# Patient Record
Sex: Female | Born: 1940 | Race: White | Hispanic: No | Marital: Married | Smoking: Never smoker
Health system: Southern US, Community
[De-identification: ages and names within clinical notes are randomized; demographics above are authoritative.]

## PROBLEM LIST (undated history)

## (undated) DIAGNOSIS — R0600 Dyspnea, unspecified: Secondary | ICD-10-CM

## (undated) DIAGNOSIS — K759 Inflammatory liver disease, unspecified: Secondary | ICD-10-CM

## (undated) DIAGNOSIS — T7840XA Allergy, unspecified, initial encounter: Secondary | ICD-10-CM

## (undated) DIAGNOSIS — R251 Tremor, unspecified: Secondary | ICD-10-CM

## (undated) DIAGNOSIS — M1990 Unspecified osteoarthritis, unspecified site: Secondary | ICD-10-CM

## (undated) DIAGNOSIS — M810 Age-related osteoporosis without current pathological fracture: Secondary | ICD-10-CM

## (undated) DIAGNOSIS — H269 Unspecified cataract: Secondary | ICD-10-CM

## (undated) DIAGNOSIS — Z8739 Personal history of other diseases of the musculoskeletal system and connective tissue: Secondary | ICD-10-CM

## (undated) DIAGNOSIS — R600 Localized edema: Secondary | ICD-10-CM

## (undated) DIAGNOSIS — E119 Type 2 diabetes mellitus without complications: Secondary | ICD-10-CM

## (undated) DIAGNOSIS — E785 Hyperlipidemia, unspecified: Secondary | ICD-10-CM

## (undated) DIAGNOSIS — K219 Gastro-esophageal reflux disease without esophagitis: Secondary | ICD-10-CM

## (undated) DIAGNOSIS — I10 Essential (primary) hypertension: Secondary | ICD-10-CM

## (undated) DIAGNOSIS — Z8719 Personal history of other diseases of the digestive system: Secondary | ICD-10-CM

## (undated) DIAGNOSIS — R609 Edema, unspecified: Secondary | ICD-10-CM

## (undated) HISTORY — DX: Unspecified osteoarthritis, unspecified site: M19.90

## (undated) HISTORY — DX: Allergy, unspecified, initial encounter: T78.40XA

## (undated) HISTORY — DX: Gastro-esophageal reflux disease without esophagitis: K21.9

## (undated) HISTORY — PX: EYE SURGERY: SHX253

## (undated) HISTORY — DX: Unspecified cataract: H26.9

## (undated) HISTORY — DX: Hyperlipidemia, unspecified: E78.5

## (undated) HISTORY — PX: JOINT REPLACEMENT: SHX530

## (undated) HISTORY — PX: APPENDECTOMY: SHX54

## (undated) HISTORY — PX: CYST EXCISION: SHX5701

## (undated) HISTORY — PX: TUBAL LIGATION: SHX77

## (undated) HISTORY — PX: PARTIAL HIP ARTHROPLASTY: SHX733

## (undated) HISTORY — PX: TONSILLECTOMY: SHX5217

---

## 2015-04-12 ENCOUNTER — Encounter: Payer: Self-pay | Admitting: Physician Assistant

## 2015-04-12 ENCOUNTER — Ambulatory Visit (INDEPENDENT_AMBULATORY_CARE_PROVIDER_SITE_OTHER): Payer: Medicare Other | Admitting: Physician Assistant

## 2015-04-12 VITALS — BP 140/72 | HR 62 | Temp 98.2°F | Resp 16 | Wt 194.6 lb

## 2015-04-12 DIAGNOSIS — Z0000 Encounter for general adult medical examination without abnormal findings: Secondary | ICD-10-CM

## 2015-04-12 DIAGNOSIS — E7800 Pure hypercholesterolemia, unspecified: Secondary | ICD-10-CM | POA: Insufficient documentation

## 2015-04-12 DIAGNOSIS — Z7189 Other specified counseling: Secondary | ICD-10-CM

## 2015-04-12 DIAGNOSIS — I10 Essential (primary) hypertension: Secondary | ICD-10-CM | POA: Diagnosis not present

## 2015-04-12 DIAGNOSIS — M810 Age-related osteoporosis without current pathological fracture: Secondary | ICD-10-CM | POA: Diagnosis not present

## 2015-04-12 DIAGNOSIS — I152 Hypertension secondary to endocrine disorders: Secondary | ICD-10-CM | POA: Insufficient documentation

## 2015-04-12 DIAGNOSIS — M1990 Unspecified osteoarthritis, unspecified site: Secondary | ICD-10-CM

## 2015-04-12 DIAGNOSIS — E119 Type 2 diabetes mellitus without complications: Secondary | ICD-10-CM | POA: Diagnosis not present

## 2015-04-12 DIAGNOSIS — M19049 Primary osteoarthritis, unspecified hand: Secondary | ICD-10-CM | POA: Insufficient documentation

## 2015-04-12 DIAGNOSIS — E1140 Type 2 diabetes mellitus with diabetic neuropathy, unspecified: Secondary | ICD-10-CM | POA: Insufficient documentation

## 2015-04-12 DIAGNOSIS — E1159 Type 2 diabetes mellitus with other circulatory complications: Secondary | ICD-10-CM | POA: Insufficient documentation

## 2015-04-12 DIAGNOSIS — Z7689 Persons encountering health services in other specified circumstances: Secondary | ICD-10-CM

## 2015-04-12 DIAGNOSIS — E1169 Type 2 diabetes mellitus with other specified complication: Secondary | ICD-10-CM | POA: Insufficient documentation

## 2015-04-12 MED ORDER — GLIPIZIDE 5 MG PO TABS: 2.5000 mg | ORAL_TABLET | Freq: Every day | ORAL | Status: DC

## 2015-04-12 MED ORDER — PRAVASTATIN SODIUM 80 MG PO TABS: 80.0000 mg | ORAL_TABLET | Freq: Every day | ORAL | Status: DC

## 2015-04-12 MED ORDER — LOSARTAN POTASSIUM 50 MG PO TABS: 50.0000 mg | ORAL_TABLET | Freq: Every day | ORAL | Status: DC

## 2015-04-12 NOTE — Patient Instructions (Signed)

## 2015-04-12 NOTE — Progress Notes (Signed)
Patient: Kathleen Tran, Female    DOB: Mar 20, 1941, 74 y.o.   MRN: [id number] Visit Date: 04/12/2015  Today's Provider: Mar Daring, PA-C   Chief Complaint  Patient presents with  . Establish Care  . Medicare Wellness  . Medication Refill   Subjective:    Annual wellness visit Kathleen Tran is a 74 y.o. female. She feels well. She reports not exercising only when she goes shopping. She reports she is sleeping well. Per patient needs her wellness done. Establish care and refills on some medicine. Patient moved from [place] 6 weeks ago. She is currently living with her husband and her son. She states that they moved down here to be close to a really good friend of hers. She also has 5 other children that are spread out. She states she can't remember where they all are. She also states that right before she left [place] her primary care doctor did have her hemoglobin A1c, bone density, and other labs. She will sign a HIPAA release form for [place] to try to obtain these results.  -----------------------------------------------------------   Review of Systems  Constitutional: Negative.   HENT: Positive for rhinorrhea and sneezing.   Eyes: Positive for itching.  Respiratory: Negative.   Cardiovascular: Negative.   Gastrointestinal: Positive for abdominal pain.  Endocrine: Positive for cold intolerance.  Genitourinary: Negative.   Musculoskeletal: Positive for back pain, arthralgias and neck pain.  Skin: Negative.   Allergic/Immunologic: Positive for environmental allergies and food allergies.  Neurological: Positive for numbness.  Hematological: Negative.   Psychiatric/Behavioral: Negative.     Social History   Social History  . Marital Status: Married    Spouse Name: N/A  . Number of Children: N/A  . Years of Education: N/A   Occupational History  . Not on file.   Social History Main Topics  . Smoking status: Not on file  . Smokeless tobacco: Not on file    . Alcohol Use: Not on file  . Drug Use: Not on file  . Sexual Activity: Not on file   Other Topics Concern  . Not on file   Social History Narrative  . No narrative on file    Patient Active Problem List   Diagnosis Date Noted  . Arthritis 04/12/2015    No past surgical history on file.  Her family history is not on file.    Previous Medications   ASPIRIN 81 MG TABLET    Take 81 mg by mouth daily.   CALCIUM CARBONATE-VITAMIN D 600-125 MG-UNIT TABS    Take by mouth daily.   GLIPIZIDE (GLUCOTROL) 5 MG TABLET    Take 2.5 mg by mouth daily before supper.   LOSARTAN (COZAAR) 50 MG TABLET    Take 50 mg by mouth daily.   METFORMIN (GLUCOPHAGE) 1000 MG TABLET    Take 1,000 mg by mouth 2 (two) times daily with a meal.   PRAVASTATIN (PRAVACHOL) 80 MG TABLET    Take 80 mg by mouth daily.    Patient Care Team: Mar Daring, PA-C as PCP - General (Physician Assistant)     Objective:   Vitals: BP 140/72 mmHg  Pulse 62  Temp(Src) 98.2 F (36.8 C) (Oral)  Resp 16  Wt 194 lb 9.6 oz (88.27 kg)  Physical Exam  Constitutional: She is oriented to person, place, and time. She appears well-developed and well-nourished. No distress.  HENT:  Head: Normocephalic and atraumatic.  Right Ear: External ear normal.  Left  Ear: External ear normal.  Nose: Nose normal.  Mouth/Throat: Oropharynx is clear and moist. No oropharyngeal exudate.  Eyes: Conjunctivae and EOM are normal. Pupils are equal, round, and reactive to light. Right eye exhibits no discharge. Left eye exhibits no discharge. No scleral icterus.  Neck: Normal range of motion. Neck supple. No JVD present. No tracheal deviation present. No thyromegaly present.  Cardiovascular: Normal rate, regular rhythm, normal heart sounds and intact distal pulses.  Exam reveals no gallop and no friction rub.   No murmur heard. Pulmonary/Chest: Effort normal and breath sounds normal. No respiratory distress. She has no wheezes. She has no  rales. She exhibits no tenderness.  Patient refused breast exam. States she recently had a mammogram.  Abdominal: Soft. Bowel sounds are normal. She exhibits no distension and no mass. There is no tenderness. There is no rebound and no guarding.  Genitourinary:  Patient refused.  Musculoskeletal: Normal range of motion. She exhibits no edema or tenderness.  Lymphadenopathy:    She has no cervical adenopathy.  Neurological: She is alert and oriented to person, place, and time.  Skin: Skin is warm and dry. No rash noted. She is not diaphoretic.  Psychiatric: She has a normal mood and affect. Her behavior is normal. Judgment and thought content normal.  Vitals reviewed.   Activities of Daily Living No flowsheet data found.  Fall Risk Assessment No flowsheet data found.   Depression Screen No flowsheet data found.  Cognitive Testing - 6-CIT  Correct? Score   What year is it? yes 0 0 or 4  What month is it? yes 0 0 or 3  Memorize:    [name],  44,  [address]  [place],      What time is it? (within 1 hour) yes 0 0 or 3  Count backwards from 20 yes 0 0, 2, or 4  Name the months of the year yes 0 0, 2, or 4  Repeat name & address above no 3 0, 2, 4, 6, 8, or 10       TOTAL SCORE  3/28   Interpretation:  Normal  Normal (0-7) Abnormal (8-28)       Assessment & Plan:     Annual Wellness Visit  Reviewed patient's Family Medical History Reviewed and updated list of patient's medical providers Assessment of cognitive impairment was done Assessed patient's functional ability Established a written schedule for health screening [place] Completed and Reviewed  Exercise Activities and Dietary recommendations Goals    None       There is no immunization history on file for this patient.  Health Maintenance  Topic Date Due  . TETANUS/TDAP  09/14/1961  . MAMMOGRAM  09/14/1992  . COLONOSCOPY  09/14/1992  . ZOSTAVAX  09/15/2002  . DEXA SCAN   09/15/2007  . PNA vac Low Risk Adult (1 of 2 - PCV13) 09/15/2007  . INFLUENZA VACCINE  02/01/2015      Discussed health benefits of physical activity, and encouraged her to engage in regular exercise appropriate for her age and condition.   1. Establishing care with new doctor, encounter for HIPAA release form was filled out and signed by patient to try to obtain previous records from her physician in [place].  2. Medicare annual wellness visit, subsequent  3. Arthritis History of arthritis and status post left total hip replacement. States she is doing okay. Right hip does bother her from time to time. We'll reevaluate at follow-up.  4.  Type 2 diabetes mellitus without complication, without long-term current use of insulin (HCC) Currently only on glipizide for her type 2 diabetes. I will follow-up with her in 2-3 months to recheck her hemoglobin A1c and do diabetic foot exam. Glipizide refilled as below. She is to call the office if she has any questions or concerns prior to the follow-up. - glipiZIDE (GLUCOTROL) 5 MG tablet; Take 0.5 tablets (2.5 mg total) by mouth daily before supper.  Dispense: 90 tablet; Refill: 1  5. Osteoporosis Diagnosed with osteoporosis via bone density study. This was done in [place] prior to her leaving. She is currently on a calcium and vitamin D supplement daily. I will try to obtain the bone density study that was done in [place] to see these results.  6. Hypercholesterolemia States she has been stable on pravastatin 80 mg. This medication was refilled as below. I will recheck her lipid panel when she returns in 2-3 months. - pravastatin (PRAVACHOL) 80 MG tablet; Take 1 tablet (80 mg total) by mouth daily.  Dispense: 90 tablet; Refill: 2  7. Essential hypertension Stable on losartan 50 mg. I'll will refill her losartan as below. We will recheck her labs and blood pressure in 2-3 months when she returns for follow-up. - losartan (COZAAR) 50 MG tablet;  Take 1 tablet (50 mg total) by mouth daily.  Dispense: 90 tablet; Refill: 2  ------------------------------------------------------------------------------------------------------------

## 2015-04-19 ENCOUNTER — Other Ambulatory Visit: Payer: Self-pay | Admitting: Physician Assistant

## 2015-04-19 DIAGNOSIS — E119 Type 2 diabetes mellitus without complications: Secondary | ICD-10-CM

## 2015-04-19 MED ORDER — ONETOUCH LANCETS MISC: Status: DC

## 2015-04-19 MED ORDER — ONETOUCH VERIO IQ SYSTEM W/DEVICE KIT: PACK | Status: DC

## 2015-04-19 MED ORDER — METFORMIN HCL 1000 MG PO TABS
1000.0000 mg | ORAL_TABLET | Freq: Two times a day (BID) | ORAL | Status: DC
Start: 2015-04-19 — End: 2016-04-01

## 2015-04-19 MED ORDER — GLUCOSE BLOOD VI STRP: ORAL_STRIP | Status: DC

## 2015-07-13 ENCOUNTER — Ambulatory Visit: Payer: Medicare Other | Admitting: Physician Assistant

## 2015-07-16 ENCOUNTER — Encounter: Payer: Self-pay | Admitting: Physician Assistant

## 2015-07-16 ENCOUNTER — Ambulatory Visit (INDEPENDENT_AMBULATORY_CARE_PROVIDER_SITE_OTHER): Payer: Medicare Other | Admitting: Physician Assistant

## 2015-07-16 VITALS — BP 138/69 | HR 74 | Temp 98.7°F | Resp 16 | Wt 196.2 lb

## 2015-07-16 DIAGNOSIS — M1990 Unspecified osteoarthritis, unspecified site: Secondary | ICD-10-CM

## 2015-07-16 DIAGNOSIS — E7800 Pure hypercholesterolemia, unspecified: Secondary | ICD-10-CM

## 2015-07-16 DIAGNOSIS — E119 Type 2 diabetes mellitus without complications: Secondary | ICD-10-CM | POA: Diagnosis not present

## 2015-07-16 DIAGNOSIS — I10 Essential (primary) hypertension: Secondary | ICD-10-CM | POA: Diagnosis not present

## 2015-07-16 LAB — POCT GLYCOSYLATED HEMOGLOBIN (HGB A1C)
Est. average glucose Bld gHb Est-mCnc: 151
Hemoglobin A1C: 6.9

## 2015-07-16 LAB — POCT UA - MICROALBUMIN: Microalbumin Ur, POC: 20 mg/L

## 2015-07-16 NOTE — Progress Notes (Signed)
Patient: Kathleen Tran Female    DOB: 10-07-1940   75 y.o.   MRN: [id number] Visit Date: 07/16/2015  Today's Provider: Mar Daring, PA-C   Chief Complaint  Patient presents with  . Follow-up    Diabetes, Arthritis, cholesterol,Hypertension   Subjective:    HPI  Patient is here for her 2-3 month follow-up on: Hypertension-stable on Losartan last office blood pressure was 140/72. Following on her Arthritis right hip and left knee that have been gradually worsening.  Right hip is worst. Starting to have radiating symptoms down right anterior thigh. Diabetes Type 2, stable on Metformin and Glipizide. Cholesterol-stable on Pravastatin. Symptoms:blurred vision, chest pain sometimes. Patient denies excessive thirst, dizziness, leg swelling,headaches, fatigue,frequent urination, irregular heart beat, SOB,palpitations or hematuria.  She has had pneumococcal-23 and prevnar vaccinations as well as Zostavax per patient approx 3 years ago.  Will attempt again to get records from Dr. Tonie Griffith in [place].    No Known Allergies Previous Medications   ASPIRIN 81 MG TABLET    Take 81 mg by mouth daily.   BLOOD GLUCOSE MONITORING SUPPL (ONETOUCH VERIO IQ SYSTEM) W/DEVICE KIT    Check blood sugar a.c.h.s.   CALCIUM CARBONATE-VITAMIN D 600-125 MG-UNIT TABS    Take by mouth daily.   GLIPIZIDE (GLUCOTROL) 5 MG TABLET    Take 0.5 tablets (2.5 mg total) by mouth daily before supper.   GLUCOSE BLOOD (ONETOUCH VERIO) TEST STRIP    Use as instructed   LOSARTAN (COZAAR) 50 MG TABLET    Take 1 tablet (50 mg total) by mouth daily.   METFORMIN (GLUCOPHAGE) 1000 MG TABLET    Take 1 tablet (1,000 mg total) by mouth 2 (two) times daily with a meal.   MULTIPLE VITAMIN (MULTI VITAMIN DAILY PO)    Take 1 tablet by mouth daily.   OMEGA-3 FATTY ACIDS (FISH OIL) 1200 MG CAPS    Take 1 capsule by mouth 2 (two) times daily.   ONE TOUCH LANCETS MISC    Check blood sugar a.c.h.s.   PRAVASTATIN (PRAVACHOL) 80 MG  TABLET    Take 1 tablet (80 mg total) by mouth daily.    Review of Systems  Constitutional: Negative for activity change, appetite change and fatigue.  HENT: Negative.   Eyes: Positive for visual disturbance.  Respiratory: Negative for cough, chest tightness, shortness of breath and wheezing.   Cardiovascular: Positive for chest pain (sometimes gets pain.). Negative for palpitations and leg swelling.  Gastrointestinal: Negative for nausea, vomiting, abdominal pain and constipation.  Endocrine: Negative for polydipsia.  Genitourinary: Negative for frequency, hematuria and enuresis.  Musculoskeletal: Positive for arthralgias (right hip, left knee). Negative for back pain.  Skin: Negative.   Allergic/Immunologic: Negative.   Neurological: Negative for dizziness, light-headedness and headaches.  Hematological: Negative.   Psychiatric/Behavioral: Negative.     Social History  Substance Use Topics  . Smoking status: Never Smoker   . Smokeless tobacco: Not on file  . Alcohol Use: Yes   Objective:   BP 138/69 mmHg  Pulse 74  Temp(Src) 98.7 F (37.1 C) (Oral)  Resp 16  Wt 196 lb 3.2 oz (88.996 kg)  Physical Exam  Constitutional: She is oriented to person, place, and time. She appears well-developed and well-nourished. No distress.  HENT:  Head: Normocephalic and atraumatic.  Right Ear: Tympanic membrane, external ear and ear canal normal.  Left Ear: Tympanic membrane, external ear and ear canal normal.  Nose: Nose normal.  Mouth/Throat: Uvula is  midline, oropharynx is clear and moist and mucous membranes are normal. No oropharyngeal exudate, posterior oropharyngeal edema or posterior oropharyngeal erythema.  Eyes: Conjunctivae are normal. Pupils are equal, round, and reactive to light. Right eye exhibits no discharge. Left eye exhibits no discharge.  Neck: Normal range of motion. Neck supple. No JVD present. No tracheal deviation present. No thyromegaly present.  Cardiovascular:  Normal rate, regular rhythm and normal heart sounds.  Exam reveals no gallop and no friction rub.   No murmur heard. Pulmonary/Chest: Effort normal and breath sounds normal. No respiratory distress. She has no wheezes. She has no rales.  Musculoskeletal: Normal range of motion. She exhibits tenderness (right lateral hip, left knee joint line medial and lateral). She exhibits no edema.  Lymphadenopathy:    She has no cervical adenopathy.  Neurological: She is alert and oriented to person, place, and time.  Skin: Skin is warm and dry. No rash noted. She is not diaphoretic.  Psychiatric: She has a normal mood and affect. Her behavior is normal. Judgment and thought content normal.  Vitals reviewed.       Assessment & Plan:     1. Essential hypertension Stable and well-controlled on losartan.  Will check labs as below and f/u pending labs. If labs are stable will not need rechecked for 6 months.  I will see her back in 6 months. She is to call the office if she develops any acute issue, question or concern. - Comprehensive Metabolic Panel (CMET) - CBC With Differential  2. Type 2 diabetes mellitus without complication, without long-term current use of insulin ([place]) HgBA1c today in the office was 6.9 and microalbumin was 20. Continue metformin '1000mg'$  BID and glipizide '5mg'$  daily.  Will refer to opthalmology for routine eye exam. Will recheck in 6 months. - POCT glycosylated hemoglobin (Hb A1C) - POCT UA - Microalbumin - Ambulatory referral to Ophthalmology  3. Arthritis Worsening symptoms in right hip and left knee. Will refer to orthopedics for further evaluation and treatment. - Ambulatory referral to Orthopedic Surgery  4. Hypercholesterolemia Stable on pravastatin. Will check labs and f/u pending labs. If labs are stable and WNL will see her back in 6 months to recheck. - Lipid panel       Mar Daring, PA-C  [facility]

## 2015-07-16 NOTE — Patient Instructions (Signed)
Diabetes and Exercise Exercising regularly is important. It is not just about losing weight. It has many health benefits, such as:  Improving your overall fitness, flexibility, and endurance.  Increasing your bone density.  Helping with weight control.  Decreasing your body fat.  Increasing your muscle strength.  Reducing stress and tension.  Improving your overall health. People with diabetes who exercise gain additional benefits because exercise:  Reduces appetite.  Improves the body's use of blood sugar (glucose).  Helps lower or control blood glucose.  Decreases blood pressure.  Helps control blood lipids (such as cholesterol and triglycerides).  Improves the body's use of the hormone insulin by:  Increasing the body's insulin sensitivity.  Reducing the body's insulin needs.  Decreases the risk for heart disease because exercising:  Lowers cholesterol and triglycerides levels.  Increases the levels of good cholesterol (such as high-density lipoproteins [HDL]) in the body.  Lowers blood glucose levels. YOUR ACTIVITY PLAN  Choose an activity that you enjoy, and set realistic goals. To exercise safely, you should begin practicing any new physical activity slowly, and gradually increase the intensity of the exercise over time. Your health care provider or diabetes educator can help create an activity plan that works for you. General recommendations include:  Encouraging children to engage in at least 60 minutes of physical activity each day.  Stretching and performing strength training exercises, such as yoga or weight lifting, at least 2 times per week.  Performing a total of at least 150 minutes of moderate-intensity exercise each week, such as brisk walking or water aerobics.  Exercising at least 3 days per week, making sure you allow no more than 2 consecutive days to pass without exercising.  Avoiding long periods of inactivity (90 minutes or more). When you  have to spend an extended period of time sitting down, take frequent breaks to walk or stretch. RECOMMENDATIONS FOR EXERCISING WITH TYPE 1 OR TYPE 2 DIABETES   Check your blood glucose before exercising. If blood glucose levels are greater than 240 mg/dL, check for urine ketones. Do not exercise if ketones are present.  Avoid injecting insulin into areas of the body that are going to be exercised. For example, avoid injecting insulin into:  The arms when playing tennis.  The legs when jogging.  Keep a record of:  Food intake before and after you exercise.  Expected peak times of insulin action.  Blood glucose levels before and after you exercise.  The type and amount of exercise you have done.  Review your records with your health care provider. Your health care provider will help you to develop guidelines for adjusting food intake and insulin amounts before and after exercising.  If you take insulin or oral hypoglycemic agents, watch for signs and symptoms of hypoglycemia. They include:  Dizziness.  Shaking.  Sweating.  Chills.  Confusion.  Drink plenty of water while you exercise to prevent dehydration or heat stroke. Body water is lost during exercise and must be replaced.  Talk to your health care provider before starting an exercise program to make sure it is safe for you. Remember, almost any type of activity is better than none.   This information is not intended to replace advice given to you by your health care provider. Make sure you discuss any questions you have with your health care provider.   Document Released: 09/09/2003 Document Revised: 11/03/2014 Document Reviewed: 11/26/2012 Elsevier Interactive Patient Education 2016 [employer]  

## 2015-07-22 ENCOUNTER — Telehealth: Payer: Self-pay

## 2015-07-22 LAB — CBC WITH DIFFERENTIAL
BASOS ABS: 0.1 10*3/uL (ref 0.0–0.2)
Basos: 1 %
EOS (ABSOLUTE): 0.3 10*3/uL (ref 0.0–0.4)
Eos: 4 %
Hematocrit: 38.6 % (ref 34.0–46.6)
Hemoglobin: 12.7 g/dL (ref 11.1–15.9)
IMMATURE GRANS (ABS): 0 10*3/uL (ref 0.0–0.1)
IMMATURE GRANULOCYTES: 0 %
LYMPHS: 29 %
Lymphocytes Absolute: 1.8 10*3/uL (ref 0.7–3.1)
MCH: 30.3 pg (ref 26.6–33.0)
MCHC: 32.9 g/dL (ref 31.5–35.7)
MCV: 92 fL (ref 79–97)
Monocytes Absolute: 0.5 10*3/uL (ref 0.1–0.9)
Monocytes: 8 %
NEUTROS PCT: 58 %
Neutrophils Absolute: 3.5 10*3/uL (ref 1.4–7.0)
RBC: 4.19 x10E6/uL (ref 3.77–5.28)
RDW: 14.6 % (ref 12.3–15.4)
WBC: 6.1 10*3/uL (ref 3.4–10.8)

## 2015-07-22 LAB — COMPREHENSIVE METABOLIC PANEL
ALT: 14 IU/L (ref 0–32)
AST: 18 IU/L (ref 0–40)
Albumin/Globulin Ratio: 1.8 (ref 1.1–2.5)
Albumin: 4.2 g/dL (ref 3.5–4.8)
Alkaline Phosphatase: 69 IU/L (ref 39–117)
BILIRUBIN TOTAL: 0.4 mg/dL (ref 0.0–1.2)
BUN/Creatinine Ratio: 24 (ref 11–26)
BUN: 16 mg/dL (ref 8–27)
CALCIUM: 9.9 mg/dL (ref 8.7–10.3)
CHLORIDE: 103 mmol/L (ref 96–106)
CO2: 25 mmol/L (ref 18–29)
CREATININE: 0.67 mg/dL (ref 0.57–1.00)
GFR, EST AFRICAN AMERICAN: 102 mL/min/{1.73_m2} (ref >59)
GFR, EST NON AFRICAN AMERICAN: 88 mL/min/{1.73_m2} (ref >59)
GLUCOSE: 162 mg/dL — AB (ref 65–99)
Globulin, Total: 2.4 g/dL (ref 1.5–4.5)
Potassium: 5 mmol/L (ref 3.5–5.2)
Sodium: 142 mmol/L (ref 134–144)
TOTAL PROTEIN: 6.6 g/dL (ref 6.0–8.5)

## 2015-07-22 LAB — LIPID PANEL
CHOL/HDL RATIO: 2.8 ratio (ref 0.0–4.4)
Cholesterol, Total: 196 mg/dL (ref 100–199)
HDL: 71 mg/dL (ref >39)
LDL CALC: 98 mg/dL (ref 0–99)
TRIGLYCERIDES: 135 mg/dL (ref 0–149)
VLDL Cholesterol Cal: 27 mg/dL (ref 5–40)

## 2015-07-22 NOTE — Telephone Encounter (Signed)
-----   Message from Mar Daring, [place] sent at 07/22/2015  1:17 PM EST ----- All labs are within normal limits and stable.  Thanks! -JB

## 2015-07-22 NOTE — Telephone Encounter (Signed)
Patient advised as directed below.  Thanks,  -Countess Biebel 

## 2015-07-29 DIAGNOSIS — M179 Osteoarthritis of knee, unspecified: Secondary | ICD-10-CM | POA: Insufficient documentation

## 2015-07-29 DIAGNOSIS — M7070 Other bursitis of hip, unspecified hip: Secondary | ICD-10-CM | POA: Insufficient documentation

## 2015-07-29 DIAGNOSIS — Z96649 Presence of unspecified artificial hip joint: Secondary | ICD-10-CM | POA: Insufficient documentation

## 2015-09-09 ENCOUNTER — Encounter: Payer: Self-pay | Admitting: Physician Assistant

## 2015-10-04 ENCOUNTER — Encounter: Payer: Self-pay | Admitting: Physician Assistant

## 2015-12-31 ENCOUNTER — Other Ambulatory Visit: Payer: Self-pay | Admitting: Physician Assistant

## 2015-12-31 DIAGNOSIS — I10 Essential (primary) hypertension: Secondary | ICD-10-CM

## 2015-12-31 DIAGNOSIS — E7800 Pure hypercholesterolemia, unspecified: Secondary | ICD-10-CM

## 2016-01-13 ENCOUNTER — Encounter: Payer: Self-pay | Admitting: Physician Assistant

## 2016-01-13 ENCOUNTER — Ambulatory Visit (INDEPENDENT_AMBULATORY_CARE_PROVIDER_SITE_OTHER): Payer: Medicare Other | Admitting: Physician Assistant

## 2016-01-13 VITALS — BP 120/60 | HR 84 | Temp 98.4°F | Resp 16 | Wt 194.8 lb

## 2016-01-13 DIAGNOSIS — E119 Type 2 diabetes mellitus without complications: Secondary | ICD-10-CM

## 2016-01-13 DIAGNOSIS — I10 Essential (primary) hypertension: Secondary | ICD-10-CM | POA: Diagnosis not present

## 2016-01-13 DIAGNOSIS — E7800 Pure hypercholesterolemia, unspecified: Secondary | ICD-10-CM

## 2016-01-13 NOTE — Progress Notes (Signed)
Patient: Kathleen Tran Female    DOB: [birth year]   75 y.o.   MRN: [id number] Visit Date: 01/13/2016  Today's Provider: Mar Daring, PA-C   Chief Complaint  Patient presents with  . Follow-up    HTN, DM and HDL   Subjective:    HPI  Diabetes Mellitus Type II, Follow-up:   Lab Results  Component Value Date   HGBA1C 6.9 07/16/2015   Last seen for diabetes 6 months ago.  Management since then includes None. She reports excellent compliance with treatment. She is not having side effects.  Current symptoms include none and have been stable. Home blood sugar records: fasting range: 130's-140's Patient reports that this morning it was 185 non-fasting.  Episodes of hypoglycemia? Yes a couple of weeks ago she had an episode and was feeling really bad that she didn't check her reading, patient states "I needed to put some sugar on my system." Reports symptoms at that time: Light headedness, dizziness,sweats, heart palpitations.   Most Recent Eye Exam: 08/2015 Weight trend: stable Current diet: in general, a "healthy" diet   Current exercise: walking  ------------------------------------------------------------------------   Hypertension, follow-up:  BP Readings from Last 3 Encounters:  01/13/16 120/60  07/16/15 138/69  04/12/15 140/72    She was last seen for hypertension 6 months ago.  BP at that visit was 138/69. Management since that visit includes none .She reports excellent compliance with treatment. She is not having side effects.  She is not exercising.She tries to walk sometimes. She is adherent to low salt diet.   Outside blood pressures are n/a. She is experiencing none.  Patient denies chest pain, chest pressure/discomfort, claudication, exertional chest pressure/discomfort, fatigue, irregular heart beat, lower extremity edema and palpitations.  She reports that that once in while she gets mild pain in her chest. Cardiovascular risk factors  include advanced age (older than 85 for men, 34 for women), diabetes mellitus, hypertension and obesity (BMI >= 30 kg/m2).   ------------------------------------------------------------------------    Lipid/Cholesterol, Follow-up:   Last seen for this 6 months ago.  Management since that visit includes None.  Last Lipid Panel:    Component Value Date/Time   CHOL 196 07/21/2015 1004   TRIG 135 07/21/2015 1004   HDL 71 07/21/2015 1004   CHOLHDL 2.8 07/21/2015 1004   LDLCALC 98 07/21/2015 1004    She reports excellent compliance with treatment. She is not having side effects.   Wt Readings from Last 3 Encounters:  01/13/16 194 lb 12.8 oz (88.361 kg)  07/16/15 196 lb 3.2 oz (88.996 kg)  04/12/15 194 lb 9.6 oz (88.27 kg)    ------------------------------------------------------------------------      No Known Allergies Current Meds  Medication Sig  . aspirin 81 MG tablet Take 81 mg by mouth daily.  . Blood Glucose Monitoring Suppl (ONETOUCH VERIO IQ SYSTEM) W/DEVICE KIT Check blood sugar a.c.h.s.  . Calcium Carbonate-Vitamin D 600-125 MG-UNIT TABS Take by mouth daily.  Marland Kitchen glipiZIDE (GLUCOTROL) 5 MG tablet Take 0.5 tablets (2.5 mg total) by mouth daily before supper.  Marland Kitchen glucose blood (ONETOUCH VERIO) test strip Use as instructed  . losartan (COZAAR) 50 MG tablet TAKE 1 TABLET(50 MG) BY MOUTH DAILY  . metFORMIN (GLUCOPHAGE) 1000 MG tablet Take 1 tablet (1,000 mg total) by mouth 2 (two) times daily with a meal.  . Multiple Vitamin (MULTI VITAMIN DAILY PO) Take 1 tablet by mouth daily.  . Omega-3 Fatty Acids (FISH OIL) 1200 MG CAPS Take 1  capsule by mouth 2 (two) times daily.  . ONE TOUCH LANCETS MISC Check blood sugar a.c.h.s.  . pravastatin (PRAVACHOL) 80 MG tablet TAKE 1 TABLET(80 MG) BY MOUTH DAILY    Review of Systems  Constitutional: Negative.   Eyes: Negative for visual disturbance.  Respiratory: Negative.   Cardiovascular: Negative.   Gastrointestinal: Negative.    Endocrine: Negative for polydipsia.  Neurological: Positive for headaches (a little). Negative for dizziness and light-headedness.    Social History  Substance Use Topics  . Smoking status: Never Smoker   . Smokeless tobacco: Not on file  . Alcohol Use: Yes   Objective:   BP 120/60 mmHg  Pulse 84  Temp(Src) 98.4 F (36.9 C) (Oral)  Resp 16  Wt 194 lb 12.8 oz (88.361 kg)  Physical Exam  Constitutional: She appears well-developed and well-nourished. No distress.  Neck: Normal range of motion. Neck supple. No JVD present. No tracheal deviation present. No thyromegaly present.  Cardiovascular: Normal rate, regular rhythm and normal heart sounds.  Exam reveals no gallop and no friction rub.   No murmur heard. Pulmonary/Chest: Effort normal and breath sounds normal. No respiratory distress. She has no wheezes. She has no rales.  Musculoskeletal: She exhibits no edema.  Lymphadenopathy:    She has no cervical adenopathy.  Skin: She is not diaphoretic.  Vitals reviewed.     Assessment & Plan:     1. Essential hypertension Stable. Diagnosis pulled for medication refill. Continue current medical treatment plan. Will check labs as below and f/u pending results. I will see her back in 6 months for her subs wellness exam. She is to call if she has any acute issue, question or concern in the meantime. - CBC with Differential - Comprehensive Metabolic Panel (CMET)  2. Type 2 diabetes mellitus without complication, without long-term current use of insulin (HCC) Stable. Diagnosis pulled for medication refill. Continue current medical treatment plan. Will check labs as below and f/u pending results. - HgB A1c  3. Hypercholesterolemia Stable. Diagnosis pulled for medication refill. Continue current medical treatment plan. Will check labs as below and f/u pending results. - Lipid Profile       Mar Daring, PA-C  [facility]

## 2016-01-13 NOTE — Patient Instructions (Addendum)
DASH Eating Plan DASH stands for "Dietary Approaches to Stop Hypertension." The DASH eating plan is a healthy eating plan that has been shown to reduce high blood pressure (hypertension). Additional health benefits may include reducing the risk of type 2 diabetes mellitus, heart disease, and stroke. The DASH eating plan may also help with weight loss. WHAT DO I NEED TO KNOW ABOUT THE DASH EATING PLAN? For the DASH eating plan, you will follow these general guidelines:  Choose foods with a percent daily value for sodium of less than 5% (as listed on the food label).  Use salt-free seasonings or herbs instead of table salt or sea salt.  Check with your health care provider or pharmacist before using salt substitutes.  Eat lower-sodium products, often labeled as "lower sodium" or "no salt added."  Eat fresh foods.  Eat more vegetables, fruits, and low-fat dairy products.  Choose whole grains. Look for the word "whole" as the first word in the ingredient list.  Choose fish and skinless chicken or turkey more often than red meat. Limit fish, poultry, and meat to 6 oz (170 g) each day.  Limit sweets, desserts, sugars, and sugary drinks.  Choose heart-healthy fats.  Limit cheese to 1 oz (28 g) per day.  Eat more home-cooked food and less restaurant, buffet, and fast food.  Limit fried foods.  Cook foods using methods other than frying.  Limit canned vegetables. If you do use them, rinse them well to decrease the sodium.  When eating at a restaurant, ask that your food be prepared with less salt, or no salt if possible. WHAT FOODS CAN I EAT? Seek help from a dietitian for individual calorie needs. Grains Whole grain or whole wheat bread. Brown rice. Whole grain or whole wheat pasta. Quinoa, bulgur, and whole grain cereals. Low-sodium cereals. Corn or whole wheat flour tortillas. Whole grain cornbread. Whole grain crackers. Low-sodium crackers. Vegetables Fresh or frozen vegetables  (raw, steamed, roasted, or grilled). Low-sodium or reduced-sodium tomato and vegetable juices. Low-sodium or reduced-sodium tomato sauce and paste. Low-sodium or reduced-sodium canned vegetables.  Fruits All fresh, canned (in natural juice), or frozen fruits. Meat and Other Protein Products Ground beef (85% or leaner), grass-fed beef, or beef trimmed of fat. Skinless chicken or turkey. Ground chicken or turkey. Pork trimmed of fat. All fish and seafood. Eggs. Dried beans, peas, or lentils. Unsalted nuts and seeds. Unsalted canned beans. Dairy Low-fat dairy products, such as skim or 1% milk, 2% or reduced-fat cheeses, low-fat ricotta or cottage cheese, or plain low-fat yogurt. Low-sodium or reduced-sodium cheeses. Fats and Oils Tub margarines without trans fats. Light or reduced-fat mayonnaise and salad dressings (reduced sodium). Avocado. Safflower, olive, or canola oils. Natural peanut or almond butter. Other Unsalted popcorn and pretzels. The items listed above may not be a complete list of recommended foods or beverages. Contact your dietitian for more options. WHAT FOODS ARE NOT RECOMMENDED? Grains White bread. White pasta. White rice. Refined cornbread. Bagels and croissants. Crackers that contain trans fat. Vegetables Creamed or fried vegetables. Vegetables in a cheese sauce. Regular canned vegetables. Regular canned tomato sauce and paste. Regular tomato and vegetable juices. Fruits Dried fruits. Canned fruit in light or heavy syrup. Fruit juice. Meat and Other Protein Products Fatty cuts of meat. Ribs, chicken wings, bacon, sausage, bologna, salami, chitterlings, fatback, hot dogs, bratwurst, and packaged luncheon meats. Salted nuts and seeds. Canned beans with salt. Dairy Whole or 2% milk, cream, half-and-half, and cream cheese. Whole-fat or sweetened yogurt. Full-fat   cheeses or blue cheese. Nondairy creamers and whipped toppings. Processed cheese, cheese spreads, or cheese  curds. Condiments Onion and garlic salt, seasoned salt, table salt, and sea salt. Canned and packaged gravies. Worcestershire sauce. Tartar sauce. Barbecue sauce. Teriyaki sauce. Soy sauce, including reduced sodium. Steak sauce. Fish sauce. Oyster sauce. Cocktail sauce. Horseradish. Ketchup and mustard. Meat flavorings and tenderizers. Bouillon cubes. Hot sauce. Tabasco sauce. Marinades. Taco seasonings. Relishes. Fats and Oils Butter, stick margarine, lard, shortening, ghee, and bacon fat. Coconut, palm kernel, or palm oils. Regular salad dressings. Other Pickles and olives. Salted popcorn and pretzels. The items listed above may not be a complete list of foods and beverages to avoid. Contact your dietitian for more information. WHERE CAN I FIND MORE INFORMATION? National Heart, Lung, and Blood Institute: travelstabloid.com   This information is not intended to replace advice given to you by your health care provider. Make sure you discuss any questions you have with your health care provider.   Document Released: 06/08/2011 Document Revised: 07/10/2014 Document Reviewed: 04/23/2013 Elsevier Interactive Patient Education 2016 [employer]. Diabetes Mellitus and Food It is important for you to manage your blood sugar (glucose) level. Your blood glucose level can be greatly affected by what you eat. Eating healthier foods in the appropriate amounts throughout the day at about the same time each day will help you control your blood glucose level. It can also help slow or prevent worsening of your diabetes mellitus. Healthy eating may even help you improve the level of your blood pressure and reach or maintain a healthy weight.  General recommendations for healthful eating and cooking habits include:  Eating meals and snacks regularly. Avoid going long periods of time without eating to lose weight.  Eating a diet that consists mainly of plant-based foods, such as  fruits, vegetables, nuts, legumes, and whole grains.  Using low-heat cooking methods, such as baking, instead of high-heat cooking methods, such as deep frying. Work with your dietitian to make sure you understand how to use the Nutrition Facts information on food labels. HOW CAN FOOD AFFECT ME? Carbohydrates Carbohydrates affect your blood glucose level more than any other type of food. Your dietitian will help you determine how many carbohydrates to eat at each meal and teach you how to count carbohydrates. Counting carbohydrates is important to keep your blood glucose at a healthy level, especially if you are using insulin or taking certain medicines for diabetes mellitus. Alcohol Alcohol can cause sudden decreases in blood glucose (hypoglycemia), especially if you use insulin or take certain medicines for diabetes mellitus. Hypoglycemia can be a life-threatening condition. Symptoms of hypoglycemia (sleepiness, dizziness, and disorientation) are similar to symptoms of having too much alcohol.  If your health care provider has given you approval to drink alcohol, do so in moderation and use the following guidelines:  Women should not have more than one drink per day, and men should not have more than two drinks per day. One drink is equal to:  12 oz of beer.  5 oz of wine.  1 oz of hard liquor.  Do not drink on an empty stomach.  Keep yourself hydrated. Have water, diet soda, or unsweetened iced tea.  Regular soda, juice, and other mixers might contain a lot of carbohydrates and should be counted. WHAT FOODS ARE NOT RECOMMENDED? As you make food choices, it is important to remember that all foods are not the same. Some foods have fewer nutrients per serving than other foods, even though they  might have the same number of calories or carbohydrates. It is difficult to get your body what it needs when you eat foods with fewer nutrients. Examples of foods that you should avoid that are high  in calories and carbohydrates but low in nutrients include:  Trans fats (most processed foods list trans fats on the Nutrition Facts label).  Regular soda.  Juice.  Candy.  Sweets, such as cake, pie, doughnuts, and cookies.  Fried foods. WHAT FOODS CAN I EAT? Eat nutrient-rich foods, which will nourish your body and keep you healthy. The food you should eat also will depend on several factors, including:  The calories you need.  The medicines you take.  Your weight.  Your blood glucose level.  Your blood pressure level.  Your cholesterol level. You should eat a variety of foods, including:  Protein.  Lean cuts of meat.  Proteins low in saturated fats, such as fish, egg whites, and beans. Avoid processed meats.  Fruits and vegetables.  Fruits and vegetables that may help control blood glucose levels, such as apples, mangoes, and yams.  Dairy products.  Choose fat-free or low-fat dairy products, such as milk, yogurt, and cheese.  Grains, bread, pasta, and rice.  Choose whole grain products, such as multigrain bread, whole oats, and brown rice. These foods may help control blood pressure.  Fats.  Foods containing healthful fats, such as nuts, avocado, olive oil, canola oil, and fish. DOES EVERYONE WITH DIABETES MELLITUS HAVE THE SAME MEAL PLAN? Because every person with diabetes mellitus is different, there is not one meal plan that works for everyone. It is very important that you meet with a dietitian who will help you create a meal plan that is just right for you.   This information is not intended to replace advice given to you by your health care provider. Make sure you discuss any questions you have with your health care provider.   Document Released: 03/16/2005 Document Revised: 07/10/2014 Document Reviewed: 05/16/2013 Elsevier Interactive Patient Education 2016 [employer]. Hypertension Hypertension, commonly called high blood pressure, is when the  force of blood pumping through your arteries is too strong. Your arteries are the blood vessels that carry blood from your heart throughout your body. A blood pressure reading consists of a higher number over a lower number, such as 110/72. The higher number (systolic) is the pressure inside your arteries when your heart pumps. The lower number (diastolic) is the pressure inside your arteries when your heart relaxes. Ideally you want your blood pressure below 120/80. Hypertension forces your heart to work harder to pump blood. Your arteries may become narrow or stiff. Having untreated or uncontrolled hypertension can cause heart attack, stroke, kidney disease, and other problems. RISK FACTORS Some risk factors for high blood pressure are controllable. Others are not.  Risk factors you cannot control include:   Race. You may be at higher risk if you are African American.  Age. Risk increases with age.  Gender. Men are at higher risk than women before age 29 years. After age 54, women are at higher risk than men. Risk factors you can control include:  Not getting enough exercise or physical activity.  Being overweight.  Getting too much fat, sugar, calories, or salt in your diet.  Drinking too much alcohol. SIGNS AND SYMPTOMS Hypertension does not usually cause signs or symptoms. Extremely high blood pressure (hypertensive crisis) may cause headache, anxiety, shortness of breath, and nosebleed. DIAGNOSIS To check if you have hypertension, your  health care provider will measure your blood pressure while you are seated, with your arm held at the level of your heart. It should be measured at least twice using the same arm. Certain conditions can cause a difference in blood pressure between your right and left arms. A blood pressure reading that is higher than normal on one occasion does not mean that you need treatment. If it is not clear whether you have high blood pressure, you may be asked to  return on a different day to have your blood pressure checked again. Or, you may be asked to monitor your blood pressure at home for 1 or more weeks. TREATMENT Treating high blood pressure includes making lifestyle changes and possibly taking medicine. Living a healthy lifestyle can help lower high blood pressure. You may need to change some of your habits. Lifestyle changes may include:  Following the DASH diet. This diet is high in fruits, vegetables, and whole grains. It is low in salt, red meat, and added sugars.  Keep your sodium intake below 2,300 mg per day.  Getting at least 30-45 minutes of aerobic exercise at least 4 times per week.  Losing weight if necessary.  Not smoking.  Limiting alcoholic beverages.  Learning ways to reduce stress. Your health care provider may prescribe medicine if lifestyle changes are not enough to get your blood pressure under control, and if one of the following is true:  You are 62-39 years of age and your systolic blood pressure is above 140.  You are 6 years of age or older, and your systolic blood pressure is above 150.  Your diastolic blood pressure is above 90.  You have diabetes, and your systolic blood pressure is over [id number] or your diastolic blood pressure is over 90.  You have kidney disease and your blood pressure is above 140/90.  You have heart disease and your blood pressure is above 140/90. Your personal target blood pressure may vary depending on your medical conditions, your age, and other factors. HOME CARE INSTRUCTIONS  Have your blood pressure rechecked as directed by your health care provider.   Take medicines only as directed by your health care provider. Follow the directions carefully. Blood pressure medicines must be taken as prescribed. The medicine does not work as well when you skip doses. Skipping doses also puts you at risk for problems.  Do not smoke.   Monitor your blood pressure at home as directed by your  health care provider. SEEK MEDICAL CARE IF:   You think you are having a reaction to medicines taken.  You have recurrent headaches or feel dizzy.  You have swelling in your ankles.  You have trouble with your vision. SEEK IMMEDIATE MEDICAL CARE IF:  You develop a severe headache or confusion.  You have unusual weakness, numbness, or feel faint.  You have severe chest or abdominal pain.  You vomit repeatedly.  You have trouble breathing. MAKE SURE YOU:   Understand these instructions.  Will watch your condition.  Will get help right away if you are not doing well or get worse.   This information is not intended to replace advice given to you by your health care provider. Make sure you discuss any questions you have with your health care provider.   Document Released: 06/19/2005 Document Revised: 11/03/2014 Document Reviewed: 04/11/2013 Elsevier Interactive Patient Education [employer].

## 2016-02-01 ENCOUNTER — Telehealth: Payer: Self-pay

## 2016-02-01 LAB — COMPREHENSIVE METABOLIC PANEL
ALT: 12 IU/L (ref 0–32)
AST: 14 IU/L (ref 0–40)
Albumin/Globulin Ratio: 2.2 (ref 1.2–2.2)
Albumin: 4 g/dL (ref 3.5–4.8)
Alkaline Phosphatase: 58 IU/L (ref 39–117)
BUN/Creatinine Ratio: 25 (ref 12–28)
BUN: 17 mg/dL (ref 8–27)
Bilirubin Total: 0.4 mg/dL (ref 0.0–1.2)
CO2: 25 mmol/L (ref 18–29)
Calcium: 10 mg/dL (ref 8.7–10.3)
Chloride: 105 mmol/L (ref 96–106)
Creatinine, Ser: 0.68 mg/dL (ref 0.57–1.00)
GFR calc Af Amer: 100 mL/min/{1.73_m2} (ref >59)
GFR calc non Af Amer: 87 mL/min/{1.73_m2} (ref >59)
Globulin, Total: 1.8 g/dL (ref 1.5–4.5)
Glucose: 145 mg/dL — ABNORMAL HIGH (ref 65–99)
Potassium: 5 mmol/L (ref 3.5–5.2)
Sodium: 144 mmol/L (ref 134–144)
Total Protein: 5.8 g/dL — ABNORMAL LOW (ref 6.0–8.5)

## 2016-02-01 LAB — LIPID PANEL
Chol/HDL Ratio: 2.4 ratio units (ref 0.0–4.4)
Cholesterol, Total: 178 mg/dL (ref 100–199)
HDL: 73 mg/dL (ref >39)
LDL Calculated: 78 mg/dL (ref 0–99)
Triglycerides: 137 mg/dL (ref 0–149)
VLDL Cholesterol Cal: 27 mg/dL (ref 5–40)

## 2016-02-01 LAB — CBC WITH DIFFERENTIAL/PLATELET
BASOS ABS: 0 10*3/uL (ref 0.0–0.2)
Basos: 0 %
EOS (ABSOLUTE): 0.2 10*3/uL (ref 0.0–0.4)
Eos: 4 %
HEMOGLOBIN: 12.2 g/dL (ref 11.1–15.9)
Hematocrit: 38 % (ref 34.0–46.6)
IMMATURE GRANS (ABS): 0 10*3/uL (ref 0.0–0.1)
Immature Granulocytes: 0 %
LYMPHS: 27 %
Lymphocytes Absolute: 1.5 10*3/uL (ref 0.7–3.1)
MCH: 30.5 pg (ref 26.6–33.0)
MCHC: 32.1 g/dL (ref 31.5–35.7)
MCV: 95 fL (ref 79–97)
MONOCYTES: 8 %
Monocytes Absolute: 0.5 10*3/uL (ref 0.1–0.9)
NEUTROS PCT: 61 %
Neutrophils Absolute: 3.3 10*3/uL (ref 1.4–7.0)
PLATELETS: 302 10*3/uL (ref 150–379)
RBC: 4 x10E6/uL (ref 3.77–5.28)
RDW: 14 % (ref 12.3–15.4)
WBC: 5.5 10*3/uL (ref 3.4–10.8)

## 2016-02-01 LAB — HEMOGLOBIN A1C
Est. average glucose Bld gHb Est-mCnc: 151 mg/dL
Hgb A1c MFr Bld: 6.9 % — ABNORMAL HIGH (ref 4.8–5.6)

## 2016-02-01 NOTE — Telephone Encounter (Signed)
Pt advised.   Thanks,   -Shaquna Geigle  

## 2016-02-01 NOTE — Telephone Encounter (Signed)
-----   Message from Mar Daring, [place] sent at 02/01/2016  9:02 AM EDT ----- All labs are within normal limits and stable.  HgBA1c stable at 6.9. Continue working on lifestyle modification with diet and exercise. Thanks! -JB

## 2016-03-29 ENCOUNTER — Other Ambulatory Visit: Payer: Self-pay | Admitting: Physician Assistant

## 2016-03-29 DIAGNOSIS — E119 Type 2 diabetes mellitus without complications: Secondary | ICD-10-CM

## 2016-04-01 ENCOUNTER — Other Ambulatory Visit: Payer: Self-pay | Admitting: Physician Assistant

## 2016-04-01 DIAGNOSIS — E119 Type 2 diabetes mellitus without complications: Secondary | ICD-10-CM

## 2016-05-11 ENCOUNTER — Other Ambulatory Visit: Payer: Self-pay | Admitting: Physician Assistant

## 2016-05-11 DIAGNOSIS — E119 Type 2 diabetes mellitus without complications: Secondary | ICD-10-CM

## 2016-07-13 ENCOUNTER — Encounter: Payer: Self-pay | Admitting: Physician Assistant

## 2016-07-13 ENCOUNTER — Ambulatory Visit (INDEPENDENT_AMBULATORY_CARE_PROVIDER_SITE_OTHER): Payer: Medicare Other | Admitting: Physician Assistant

## 2016-07-13 VITALS — BP 140/60 | HR 71 | Temp 98.3°F | Resp 16 | Wt 199.4 lb

## 2016-07-13 DIAGNOSIS — I10 Essential (primary) hypertension: Secondary | ICD-10-CM | POA: Diagnosis not present

## 2016-07-13 DIAGNOSIS — Z1231 Encounter for screening mammogram for malignant neoplasm of breast: Secondary | ICD-10-CM | POA: Diagnosis not present

## 2016-07-13 DIAGNOSIS — E119 Type 2 diabetes mellitus without complications: Secondary | ICD-10-CM

## 2016-07-13 DIAGNOSIS — Z1239 Encounter for other screening for malignant neoplasm of breast: Secondary | ICD-10-CM

## 2016-07-13 DIAGNOSIS — Z1211 Encounter for screening for malignant neoplasm of colon: Secondary | ICD-10-CM | POA: Diagnosis not present

## 2016-07-13 DIAGNOSIS — E7800 Pure hypercholesterolemia, unspecified: Secondary | ICD-10-CM | POA: Diagnosis not present

## 2016-07-13 DIAGNOSIS — Z0000 Encounter for general adult medical examination without abnormal findings: Secondary | ICD-10-CM

## 2016-07-13 NOTE — Patient Instructions (Signed)

## 2016-07-13 NOTE — Progress Notes (Deleted)
Patient: Kathleen Tran Female    DOB: [birth year]   76 y.o.   MRN: [id number] Visit Date: 07/13/2016  Today's Provider: Mar Daring, PA-C   No chief complaint on file.  Subjective:    HPI     No Known Allergies   Current Outpatient Prescriptions:  .  aspirin 81 MG tablet, Take 81 mg by mouth daily., Disp: , Rfl:  .  Blood Glucose Monitoring Suppl (ONETOUCH VERIO IQ SYSTEM) W/DEVICE KIT, Check blood sugar a.c.h.s., Disp: 1 kit, Rfl: 0 .  Calcium Carbonate-Vitamin D 600-125 MG-UNIT TABS, Take by mouth daily., Disp: , Rfl:  .  glipiZIDE (GLUCOTROL) 5 MG tablet, TAKE 1/2 TABLET BY MOUTH EVERY DAY BEFORE SUPPER, Disp: 90 tablet, Rfl: 1 .  glucose blood (ONETOUCH VERIO) test strip, Use as instructed, Disp: 100 each, Rfl: 12 .  losartan (COZAAR) 50 MG tablet, TAKE 1 TABLET(50 MG) BY MOUTH DAILY, Disp: 90 tablet, Rfl: 3 .  metFORMIN (GLUCOPHAGE) 1000 MG tablet, TAKE 1 TABLET(1000 MG) BY MOUTH TWICE DAILY WITH A MEAL, Disp: 180 tablet, Rfl: 1 .  Multiple Vitamin (MULTI VITAMIN DAILY PO), Take 1 tablet by mouth daily., Disp: , Rfl:  .  Omega-3 Fatty Acids (FISH OIL) 1200 MG CAPS, Take 1 capsule by mouth 2 (two) times daily., Disp: , Rfl:  .  ONETOUCH DELICA LANCETS FINE MISC, CHECK BLOOD SUGAR BEFORE MEALS AND AT BEDTIME, Disp: 100 each, Rfl: 3 .  pravastatin (PRAVACHOL) 80 MG tablet, TAKE 1 TABLET(80 MG) BY MOUTH DAILY, Disp: 90 tablet, Rfl: 3  Review of Systems  Social History  Substance Use Topics  . Smoking status: Never Smoker  . Smokeless tobacco: Not on file  . Alcohol use Yes   Objective:   There were no vitals taken for this visit.  Physical Exam      Assessment & Plan:           Mar Daring, PA-C  [facility]

## 2016-07-13 NOTE — Progress Notes (Signed)
Patient: Kathleen Tran, Female    DOB: Jul 26, 1940, 76 y.o.   MRN: [id number] Visit Date: 07/13/2016  Today's Provider: Mar Daring, PA-C   Chief Complaint  Patient presents with  . Medicare Wellness   Subjective:    Annual wellness visit Kathleen Tran is a 76 y.o. female. She feels well. She reports exercising none. She reports she is sleeping well.  Declined Influenza Vaccine/ Copy of other immunizations scanned in chart.  AWE-04-12-15 BMD-12-17-14- Osteopenia -----------------------------------------------------------   Review of Systems  Constitutional: Negative.   HENT: Positive for nosebleeds, rhinorrhea, sneezing, trouble swallowing and voice change.   Eyes: Positive for itching.  Respiratory: Positive for cough.   Cardiovascular: Negative.   Gastrointestinal: Positive for abdominal distention, abdominal pain and diarrhea.  Endocrine: Positive for cold intolerance.  Genitourinary: Negative.   Musculoskeletal: Positive for arthralgias, neck pain and neck stiffness.  Skin: Negative.   Allergic/Immunologic: Positive for food allergies.  Neurological: Positive for numbness.  Hematological: Negative.   Psychiatric/Behavioral: Negative.   Chronic issues; none are acute  Social History   Social History  . Marital status: Married    Spouse name: N/A  . Number of children: N/A  . Years of education: N/A   Occupational History  . Not on file.   Social History Main Topics  . Smoking status: Never Smoker  . Smokeless tobacco: Not on file  . Alcohol use Yes  . Drug use: No  . Sexual activity: Not on file   Other Topics Concern  . Not on file   Social History Narrative  . No narrative on file    Past Medical History:  Diagnosis Date  . Arthritis   . GERD (gastroesophageal reflux disease)   . Hyperlipidemia      Patient Active Problem List   Diagnosis Date Noted  . Arthritis 04/12/2015  . Type 2 diabetes mellitus without complication,  without long-term current use of insulin ([place]) 04/12/2015  . Osteoporosis 04/12/2015  . Hypercholesterolemia 04/12/2015  . Essential hypertension 04/12/2015    Past Surgical History:  Procedure Laterality Date  . APPENDECTOMY    . PARTIAL HIP ARTHROPLASTY Right   . TONSILLECTOMY      Her family history is not on file.      Current Outpatient Prescriptions:  .  aspirin 81 MG tablet, Take 81 mg by mouth daily., Disp: , Rfl:  .  Blood Glucose Monitoring Suppl (ONETOUCH VERIO IQ SYSTEM) W/DEVICE KIT, Check blood sugar a.c.h.s., Disp: 1 kit, Rfl: 0 .  Calcium Carbonate-Vitamin D 600-125 MG-UNIT TABS, Take by mouth daily., Disp: , Rfl:  .  glipiZIDE (GLUCOTROL) 5 MG tablet, TAKE 1/2 TABLET BY MOUTH EVERY DAY BEFORE SUPPER, Disp: 90 tablet, Rfl: 1 .  glucose blood (ONETOUCH VERIO) test strip, Use as instructed, Disp: 100 each, Rfl: 12 .  losartan (COZAAR) 50 MG tablet, TAKE 1 TABLET(50 MG) BY MOUTH DAILY, Disp: 90 tablet, Rfl: 3 .  metFORMIN (GLUCOPHAGE) 1000 MG tablet, TAKE 1 TABLET(1000 MG) BY MOUTH TWICE DAILY WITH A MEAL, Disp: 180 tablet, Rfl: 1 .  Multiple Vitamin (MULTI VITAMIN DAILY PO), Take 1 tablet by mouth daily., Disp: , Rfl:  .  Omega-3 Fatty Acids (FISH OIL) 1200 MG CAPS, Take 1 capsule by mouth 2 (two) times daily., Disp: , Rfl:  .  ONETOUCH DELICA LANCETS FINE MISC, CHECK BLOOD SUGAR BEFORE MEALS AND AT BEDTIME, Disp: 100 each, Rfl: 3 .  pravastatin (PRAVACHOL) 80 MG tablet, TAKE 1 TABLET(80 MG)  BY MOUTH DAILY, Disp: 90 tablet, Rfl: 3  Patient Care Team: Mar Daring, PA-C as PCP - General (Physician Assistant)     Objective:   Vitals: BP 140/60 (BP Location: Right Arm, Patient Position: Sitting, Cuff Size: Normal)   Pulse 71   Temp 98.3 F (36.8 C) (Oral)   Resp 16   Wt 199 lb 6.4 oz (90.4 kg)   Physical Exam  Constitutional: She is oriented to person, place, and time. She appears well-developed and well-nourished. No distress.  HENT:  Head:  Normocephalic and atraumatic.  Right Ear: Tympanic membrane, external ear and ear canal normal.  Left Ear: Tympanic membrane, external ear and ear canal normal.  Nose: Nose normal.  Mouth/Throat: Uvula is midline, oropharynx is clear and moist and mucous membranes are normal. No oropharyngeal exudate.  Eyes: Conjunctivae and EOM are normal. Pupils are equal, round, and reactive to light. Right eye exhibits no discharge. Left eye exhibits no discharge. No scleral icterus.  Neck: Normal range of motion. Neck supple. No JVD present. Carotid bruit is not present. No tracheal deviation present. No thyromegaly present.  Cardiovascular: Normal rate, regular rhythm, normal heart sounds and intact distal pulses.  Exam reveals no gallop and no friction rub.   No murmur heard. Pulmonary/Chest: Effort normal and breath sounds normal. No respiratory distress. She has no wheezes. She has no rales. She exhibits no tenderness.  Abdominal: Soft. Bowel sounds are normal. She exhibits no distension and no mass. There is no tenderness. There is no rebound and no guarding.  Musculoskeletal: Normal range of motion. She exhibits no edema (trace) or tenderness.  Lymphadenopathy:    She has no cervical adenopathy.  Neurological: She is alert and oriented to person, place, and time.  Skin: Skin is warm and dry. No rash noted. She is not diaphoretic.  Psychiatric: She has a normal mood and affect. Her behavior is normal. Judgment and thought content normal.  Vitals reviewed.   Activities of Daily Living In your present state of health, do you have any difficulty performing the following activities: 07/13/2016  Hearing? Y  Vision? Y  Difficulty concentrating or making decisions? N  Walking or climbing stairs? Y  Dressing or bathing? N  Doing errands, shopping? N  Some recent data might be hidden    Fall Risk Assessment Fall Risk  07/13/2016  Falls in the past year? No     Depression Screen PHQ 2/9 Scores  07/13/2016  PHQ - 2 Score 0    Cognitive Testing - 6-CIT  Correct? Score   What year is it? yes 0 0 or 4  What month is it? yes 0 0 or 3  Memorize:    [name],  44,  [address],  [place],      What time is it? (within 1 hour) yes 0 0 or 3  Count backwards from 20 yes 0 0, 2, or 4  Name the months of the year yes 0 0, 2, or 4  Repeat name & address above yes 0 0, 2, 4, 6, 8, or 10       TOTAL SCORE  0/28   Interpretation:  Normal  Normal (0-7) Abnormal (8-28)   Audit-C Alcohol Use Screening  Question Answer Points  How often do you have alcoholic drink? never 0  On days you do drink alcohol, how many drinks do you typically consume? 0 0  How oftey will you drink 6 or more in a total? never 0  Total Score:  0   A score of 3 or more in women, and 4 or more in men indicates increased risk for alcohol abuse, EXCEPT if all of the points are from question 1.     Assessment & Plan:     Annual Wellness Visit  Reviewed patient's Family Medical History Reviewed and updated list of patient's medical providers Assessment of cognitive impairment was done Assessed patient's functional ability Established a written schedule for health screening [place] Completed and Reviewed  Exercise Activities and Dietary recommendations Goals    None       There is no immunization history on file for this patient.  Health Maintenance  Topic Date Due  . TETANUS/TDAP  09/14/1961  . MAMMOGRAM  09/14/1992  . COLONOSCOPY  09/14/1992  . ZOSTAVAX  09/15/2002  . PNA vac Low Risk Adult (1 of 2 - PCV13) 09/15/2007  . INFLUENZA VACCINE  02/01/2016  . FOOT EXAM  07/15/2016  . HEMOGLOBIN A1C  08/02/2016  . OPHTHALMOLOGY EXAM  08/09/2016  . DEXA SCAN  Completed     Discussed health benefits of physical activity, and encouraged her to engage in regular exercise appropriate for her age and condition.    1. Medicare annual wellness visit, subsequent Normal physical  exam.  2. Breast cancer screening There is no family history of breast cancer. She does perform regular self breast exams. Mammogram was ordered as below. Information for [facility] was given to patient so she may schedule her mammogram at her convenience. - MM Digital Screening; Future  3. Colon cancer screening Patient has never had colonoscopy. She refuses but will get if she has positive cologuard she will get a colonoscopy. There is no family history.  - Cologuard  4. Essential hypertension Stable. Continue losartan '50mg'$  daily. Will check labs as below and f/u pending results. I will see her back in 6 months for recheck. - CBC w/Diff/Platelet - Comprehensive Metabolic Panel (CMET)  5. Type 2 diabetes mellitus without complication, without long-term current use of insulin (HCC) Stable. At home visit through insurance company A1c was 6.3. Continue metformin '1000mg'$  daily, glipizide 5 mg daily. Will check labs as below and f/u pending results. - Comprehensive Metabolic Panel (CMET) - HgB A1c  6. Hypercholesterolemia Stable on pravastatin '80mg'$ . Will check labs as below and f/u pending results. - Comprehensive Metabolic Panel (CMET) - Lipid Profile  ------------------------------------------------------------------------------------------------------------    Mar Daring, PA-C  [facility]

## 2016-07-19 LAB — LIPID PANEL
Chol/HDL Ratio: 2.9 ratio units (ref 0.0–4.4)
Cholesterol, Total: 181 mg/dL (ref 100–199)
HDL: 62 mg/dL (ref >39)
LDL Calculated: 85 mg/dL (ref 0–99)
Triglycerides: 172 mg/dL — ABNORMAL HIGH (ref 0–149)
VLDL Cholesterol Cal: 34 mg/dL (ref 5–40)

## 2016-07-19 LAB — CBC WITH DIFFERENTIAL/PLATELET
BASOS ABS: 0 10*3/uL (ref 0.0–0.2)
Basos: 1 %
EOS (ABSOLUTE): 0.2 10*3/uL (ref 0.0–0.4)
EOS: 4 %
HEMATOCRIT: 38.3 % (ref 34.0–46.6)
Hemoglobin: 13 g/dL (ref 11.1–15.9)
Immature Grans (Abs): 0 10*3/uL (ref 0.0–0.1)
Immature Granulocytes: 0 %
LYMPHS ABS: 1.3 10*3/uL (ref 0.7–3.1)
Lymphs: 25 %
MCH: 31.5 pg (ref 26.6–33.0)
MCHC: 33.9 g/dL (ref 31.5–35.7)
MCV: 93 fL (ref 79–97)
Monocytes Absolute: 0.4 10*3/uL (ref 0.1–0.9)
Monocytes: 7 %
Neutrophils Absolute: 3.4 10*3/uL (ref 1.4–7.0)
Neutrophils: 63 %
Platelets: 316 10*3/uL (ref 150–379)
RBC: 4.13 x10E6/uL (ref 3.77–5.28)
RDW: 14.1 % (ref 12.3–15.4)
WBC: 5.4 10*3/uL (ref 3.4–10.8)

## 2016-07-19 LAB — COMPREHENSIVE METABOLIC PANEL
ALT: 15 IU/L (ref 0–32)
AST: 18 IU/L (ref 0–40)
Albumin/Globulin Ratio: 1.9 (ref 1.2–2.2)
Albumin: 4.1 g/dL (ref 3.5–4.8)
Alkaline Phosphatase: 66 IU/L (ref 39–117)
BILIRUBIN TOTAL: 0.4 mg/dL (ref 0.0–1.2)
BUN/Creatinine Ratio: 33 — ABNORMAL HIGH (ref 12–28)
BUN: 20 mg/dL (ref 8–27)
CHLORIDE: 104 mmol/L (ref 96–106)
CO2: 27 mmol/L (ref 18–29)
CREATININE: 0.6 mg/dL (ref 0.57–1.00)
Calcium: 10.1 mg/dL (ref 8.7–10.3)
GFR calc Af Amer: 105 mL/min/{1.73_m2} (ref >59)
GFR calc non Af Amer: 91 mL/min/{1.73_m2} (ref >59)
GLOBULIN, TOTAL: 2.2 g/dL (ref 1.5–4.5)
Glucose: 178 mg/dL — ABNORMAL HIGH (ref 65–99)
POTASSIUM: 4.9 mmol/L (ref 3.5–5.2)
SODIUM: 143 mmol/L (ref 134–144)
Total Protein: 6.3 g/dL (ref 6.0–8.5)

## 2016-07-19 LAB — HEMOGLOBIN A1C
ESTIMATED AVERAGE GLUCOSE: 137 mg/dL
HEMOGLOBIN A1C: 6.4 % — AB (ref 4.8–5.6)

## 2016-07-21 NOTE — Progress Notes (Signed)
Advised  ED 

## 2016-08-21 ENCOUNTER — Ambulatory Visit
Admission: RE | Admit: 2016-08-21 | Discharge: 2016-08-21 | Disposition: A | Discharge status: Home / Self Care | Payer: Medicare Other | Source: Ambulatory Visit | Attending: Physician Assistant | Admitting: Physician Assistant

## 2016-08-21 DIAGNOSIS — Z1231 Encounter for screening mammogram for malignant neoplasm of breast: Secondary | ICD-10-CM | POA: Insufficient documentation

## 2016-08-21 DIAGNOSIS — Z1239 Encounter for other screening for malignant neoplasm of breast: Secondary | ICD-10-CM

## 2016-09-08 ENCOUNTER — Inpatient Hospital Stay
Admission: RE | Admit: 2016-09-08 | Discharge: 2016-09-08 | Disposition: A | Discharge status: Home / Self Care | Payer: Self-pay | Source: Ambulatory Visit | Attending: *Deleted | Admitting: *Deleted

## 2016-09-08 ENCOUNTER — Other Ambulatory Visit: Payer: Self-pay | Admitting: *Deleted

## 2016-09-08 DIAGNOSIS — Z1231 Encounter for screening mammogram for malignant neoplasm of breast: Secondary | ICD-10-CM

## 2016-09-11 ENCOUNTER — Telehealth: Payer: Self-pay

## 2016-09-11 NOTE — Telephone Encounter (Signed)
LMTCB 09/11/2016  Thanks,   -Mickel Baas

## 2016-09-11 NOTE — Telephone Encounter (Signed)
Pt advised.   Thanks,   -Barrie Sigmund  

## 2016-09-11 NOTE — Telephone Encounter (Signed)
-----   Message from Mar Daring, PA-C sent at 09/11/2016  8:31 AM EDT ----- Normal mammogram. Repeat screening in one year.

## 2016-09-23 ENCOUNTER — Other Ambulatory Visit: Payer: Self-pay | Admitting: Physician Assistant

## 2016-09-23 DIAGNOSIS — E119 Type 2 diabetes mellitus without complications: Secondary | ICD-10-CM

## 2016-10-23 LAB — FECAL OCCULT BLOOD, GUAIAC: FECAL OCCULT BLD: NEGATIVE

## 2016-11-08 ENCOUNTER — Encounter: Payer: Self-pay | Admitting: Physician Assistant

## 2016-12-12 ENCOUNTER — Other Ambulatory Visit: Payer: Self-pay | Admitting: Physician Assistant

## 2016-12-12 DIAGNOSIS — E7800 Pure hypercholesterolemia, unspecified: Secondary | ICD-10-CM

## 2016-12-12 DIAGNOSIS — I10 Essential (primary) hypertension: Secondary | ICD-10-CM

## 2017-01-10 ENCOUNTER — Encounter: Payer: Self-pay | Admitting: Physician Assistant

## 2017-01-10 ENCOUNTER — Ambulatory Visit (INDEPENDENT_AMBULATORY_CARE_PROVIDER_SITE_OTHER): Payer: Medicare Other | Admitting: Physician Assistant

## 2017-01-10 VITALS — BP 138/82 | HR 60 | Temp 98.4°F | Resp 16 | Wt 193.8 lb

## 2017-01-10 DIAGNOSIS — I10 Essential (primary) hypertension: Secondary | ICD-10-CM

## 2017-01-10 DIAGNOSIS — E119 Type 2 diabetes mellitus without complications: Secondary | ICD-10-CM | POA: Diagnosis not present

## 2017-01-10 DIAGNOSIS — E7800 Pure hypercholesterolemia, unspecified: Secondary | ICD-10-CM

## 2017-01-10 LAB — POCT GLYCOSYLATED HEMOGLOBIN (HGB A1C)
Est. average glucose Bld gHb Est-mCnc: 146
Hemoglobin A1C: 6.7

## 2017-01-10 NOTE — Progress Notes (Signed)
Patient: Kathleen Tran Female    DOB: July 25, 1940   76 y.o.   MRN: [id number] Visit Date: 01/12/2017  Today's Provider: Mar Daring, PA-C   Chief Complaint  Patient presents with  . Follow-up    T2DM, HTN, Lipids   Subjective:    HPI  Diabetes Mellitus Type II, Follow-up:   Lab Results  Component Value Date   HGBA1C 6.7 01/10/2017   HGBA1C 6.4 (H) 07/18/2016   HGBA1C 6.9 (H) 01/31/2016   Last seen for diabetes 6 months ago.  Management since then includes none. She reports excellent compliance with treatment. She is not having side effects.  Current symptoms include none and have been stable. Home blood sugar records: 140's-100's A1C was done through [facility], it was 6.3 reported last office visit.  Episodes of hypoglycemia? no Weight trend: stable Prior visit with dietician: no Current diet: in general, a "healthy" diet   Current exercise: walking ------------------------------------------------------------------------   Hypertension, follow-up:  BP Readings from Last 3 Encounters:  01/10/17 138/82  07/13/16 140/60  01/13/16 120/60    She was last seen for hypertension 6 months ago.  BP at that visit was 140/60. Management since that visit includes none. She reports excellent compliance with treatment. She is not having side effects.  She is exercising. She is adherent to low salt diet.   Outside blood pressures are n/a She is experiencing none.  Patient denies chest pain, chest pressure/discomfort, exertional chest pressure/discomfort, fatigue, irregular heart beat, lower extremity edema, near-syncope and palpitations.   Cardiovascular risk factors include advanced age (older than 58 for men, 76 for women), diabetes mellitus, dyslipidemia, hypertension and obesity (BMI >= 30 kg/m2).  Use of agents associated with hypertension: none.   ------------------------------------------------------------------------    Lipid/Cholesterol,  Follow-up:   Last seen for this 6 months ago.  Management since that visit includes none.  Last Lipid Panel:    Component Value Date/Time   CHOL 181 07/18/2016 0923   TRIG 172 (H) 07/18/2016 0923   HDL 62 07/18/2016 0923   CHOLHDL 2.9 07/18/2016 0923   LDLCALC 85 07/18/2016 0923    She reports excellent compliance with treatment. She is not having side effects.   Wt Readings from Last 3 Encounters:  01/10/17 193 lb 12.8 oz (87.9 kg)  07/13/16 199 lb 6.4 oz (90.4 kg)  01/13/16 194 lb 12.8 oz (88.4 kg)   ------------------------------------------------------------------------     No Known Allergies   Current Outpatient Prescriptions:  .  aspirin 81 MG tablet, Take 81 mg by mouth daily., Disp: , Rfl:  .  Blood Glucose Monitoring Suppl (ONETOUCH VERIO IQ SYSTEM) W/DEVICE KIT, Check blood sugar a.c.h.s., Disp: 1 kit, Rfl: 0 .  Calcium Carbonate-Vitamin D 600-125 MG-UNIT TABS, Take by mouth daily., Disp: , Rfl:  .  glipiZIDE (GLUCOTROL) 5 MG tablet, TAKE 1/2 TABLET BY MOUTH EVERY DAY BEFORE SUPPER, Disp: 90 tablet, Rfl: 1 .  glucose blood (ONETOUCH VERIO) test strip, Use as instructed, Disp: 100 each, Rfl: 12 .  losartan (COZAAR) 50 MG tablet, TAKE 1 TABLET(50 MG) BY MOUTH DAILY, Disp: 90 tablet, Rfl: 3 .  metFORMIN (GLUCOPHAGE) 1000 MG tablet, TAKE 1 TABLET(1000 MG) BY MOUTH TWICE DAILY WITH A MEAL, Disp: 180 tablet, Rfl: 1 .  Multiple Vitamin (MULTI VITAMIN DAILY PO), Take 1 tablet by mouth daily., Disp: , Rfl:  .  Omega-3 Fatty Acids (FISH OIL) 1200 MG CAPS, Take 1 capsule by mouth 2 (two) times daily., Disp: ,  Rfl:  .  ONETOUCH DELICA LANCETS FINE MISC, CHECK BLOOD SUGAR BEFORE MEALS AND AT BEDTIME, Disp: 100 each, Rfl: 3 .  pravastatin (PRAVACHOL) 80 MG tablet, TAKE 1 TABLET(80 MG) BY MOUTH DAILY, Disp: 90 tablet, Rfl: 3  Review of Systems  Constitutional: Negative for fatigue and fever.  Eyes: Negative for visual disturbance.  Respiratory: Negative for chest tightness,  shortness of breath and wheezing.   Cardiovascular: Negative for chest pain, palpitations and leg swelling.       She reports once in while she gets a sharp on right breast.Does not radiates. Last month she had the "sharp pain and it last for a few minutes. 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  Endocrine: Negative for polydipsia and polyuria.  Neurological: Negative for dizziness, light-headedness and headaches.    Social History  Substance Use Topics  . Smoking status: Never Smoker  . Smokeless tobacco: Never Used  . Alcohol use Yes   Objective:   BP 138/82 (BP Location: Left Arm, Patient Position: Sitting, Cuff Size: Normal)   Pulse 60   Temp 98.4 F (36.9 C) (Oral)   Resp 16   Wt 193 lb 12.8 oz (87.9 kg)   SpO2 95%    Physical Exam  Constitutional: She appears well-developed and well-nourished. No distress.  Neck: Normal range of motion. Neck supple. No JVD present. No tracheal deviation present. No thyromegaly present.  Cardiovascular: Normal rate, regular rhythm and normal heart sounds.  Exam reveals no gallop and no friction rub.   No murmur heard. Pulmonary/Chest: Effort normal and breath sounds normal. No respiratory distress. She has no wheezes. She has no rales.  Lymphadenopathy:    She has no cervical adenopathy.  Skin: She is not diaphoretic.  Vitals reviewed.       Assessment & Plan:     1. Essential hypertension Stable. Continue losartan '50mg'$ . I will see her back in 6 months for her AWV.    2. Type 2 diabetes mellitus without complication, without long-term current use of insulin (HCC) A1c slight increase to 6.7. Continue metformin '1000mg'$  BID and glipizide '5mg'$  daily. We will recheck in 6 months.   - POCT glycosylated hemoglobin (Hb A1C)  3. Hypercholesterolemia Stable. Continue pravastatin '80mg'$ .        Mar Daring, PA-C  [facility]

## 2017-01-10 NOTE — Patient Instructions (Signed)

## 2017-03-06 ENCOUNTER — Other Ambulatory Visit: Payer: Self-pay | Admitting: Physician Assistant

## 2017-03-06 DIAGNOSIS — E119 Type 2 diabetes mellitus without complications: Secondary | ICD-10-CM

## 2017-03-07 ENCOUNTER — Other Ambulatory Visit: Payer: Self-pay | Admitting: Physician Assistant

## 2017-03-07 DIAGNOSIS — E119 Type 2 diabetes mellitus without complications: Secondary | ICD-10-CM

## 2017-03-19 ENCOUNTER — Other Ambulatory Visit: Payer: Self-pay | Admitting: Physician Assistant

## 2017-03-19 DIAGNOSIS — E119 Type 2 diabetes mellitus without complications: Secondary | ICD-10-CM

## 2017-05-14 ENCOUNTER — Other Ambulatory Visit: Payer: Self-pay | Admitting: Physician Assistant

## 2017-05-14 DIAGNOSIS — E119 Type 2 diabetes mellitus without complications: Secondary | ICD-10-CM

## 2017-06-12 ENCOUNTER — Other Ambulatory Visit: Payer: Self-pay | Admitting: Family Medicine

## 2017-06-12 DIAGNOSIS — E119 Type 2 diabetes mellitus without complications: Secondary | ICD-10-CM

## 2017-07-04 LAB — HM DIABETES EYE EXAM

## 2017-07-06 ENCOUNTER — Encounter: Payer: Self-pay | Admitting: Physician Assistant

## 2017-07-13 ENCOUNTER — Ambulatory Visit
Admission: RE | Admit: 2017-07-13 | Discharge: 2017-07-13 | Disposition: A | Discharge status: Home / Self Care | Payer: Medicare Other | Source: Ambulatory Visit | Attending: Physician Assistant | Admitting: Physician Assistant

## 2017-07-13 ENCOUNTER — Encounter: Payer: Self-pay | Admitting: Physician Assistant

## 2017-07-13 ENCOUNTER — Ambulatory Visit (INDEPENDENT_AMBULATORY_CARE_PROVIDER_SITE_OTHER): Payer: Medicare Other

## 2017-07-13 ENCOUNTER — Ambulatory Visit (INDEPENDENT_AMBULATORY_CARE_PROVIDER_SITE_OTHER): Payer: Medicare Other | Admitting: Physician Assistant

## 2017-07-13 ENCOUNTER — Telehealth: Payer: Self-pay

## 2017-07-13 VITALS — BP 122/72 | HR 64 | Temp 97.5°F | Ht 58.25 in | Wt 194.2 lb

## 2017-07-13 DIAGNOSIS — M5442 Lumbago with sciatica, left side: Secondary | ICD-10-CM | POA: Insufficient documentation

## 2017-07-13 DIAGNOSIS — G8929 Other chronic pain: Secondary | ICD-10-CM | POA: Diagnosis not present

## 2017-07-13 DIAGNOSIS — Z0000 Encounter for general adult medical examination without abnormal findings: Secondary | ICD-10-CM

## 2017-07-13 DIAGNOSIS — E7800 Pure hypercholesterolemia, unspecified: Secondary | ICD-10-CM | POA: Diagnosis not present

## 2017-07-13 DIAGNOSIS — M79605 Pain in left leg: Secondary | ICD-10-CM

## 2017-07-13 DIAGNOSIS — B351 Tinea unguium: Secondary | ICD-10-CM

## 2017-07-13 DIAGNOSIS — M47816 Spondylosis without myelopathy or radiculopathy, lumbar region: Secondary | ICD-10-CM | POA: Diagnosis not present

## 2017-07-13 DIAGNOSIS — Z0001 Encounter for general adult medical examination with abnormal findings: Secondary | ICD-10-CM | POA: Diagnosis not present

## 2017-07-13 DIAGNOSIS — Z1211 Encounter for screening for malignant neoplasm of colon: Secondary | ICD-10-CM

## 2017-07-13 DIAGNOSIS — M47817 Spondylosis without myelopathy or radiculopathy, lumbosacral region: Secondary | ICD-10-CM | POA: Diagnosis not present

## 2017-07-13 DIAGNOSIS — E119 Type 2 diabetes mellitus without complications: Secondary | ICD-10-CM

## 2017-07-13 NOTE — Patient Instructions (Signed)
Health Maintenance for Postmenopausal Women Menopause is a normal process in which your reproductive ability comes to an end. This process happens gradually over a span of months to years, usually between the ages of 22 and 9. Menopause is complete when you have missed 12 consecutive menstrual periods. It is important to talk with your health care provider about some of the most common conditions that affect postmenopausal women, such as heart disease, cancer, and bone loss (osteoporosis). Adopting a healthy lifestyle and getting preventive care can help to promote your health and wellness. Those actions can also lower your chances of developing some of these common conditions. What should I know about menopause? During menopause, you may experience a number of symptoms, such as:  Moderate-to-severe hot flashes.  Night sweats.  Decrease in sex drive.  Mood swings.  Headaches.  Tiredness.  Irritability.  Memory problems.  Insomnia.  Choosing to treat or not to treat menopausal changes is an individual decision that you make with your health care provider. What should I know about hormone replacement therapy and supplements? Hormone therapy products are effective for treating symptoms that are associated with menopause, such as hot flashes and night sweats. Hormone replacement carries certain risks, especially as you become older. If you are thinking about using estrogen or estrogen with progestin treatments, discuss the benefits and risks with your health care provider. What should I know about heart disease and stroke? Heart disease, heart attack, and stroke become more likely as you age. This may be due, in part, to the hormonal changes that your body experiences during menopause. These can affect how your body processes dietary fats, triglycerides, and cholesterol. Heart attack and stroke are both medical emergencies. There are many things that you can do to help prevent heart disease  and stroke:  Have your blood pressure checked at least every 1-2 years. High blood pressure causes heart disease and increases the risk of stroke.  If you are 53-22 years old, ask your health care provider if you should take aspirin to prevent a heart attack or a stroke.  Do not use any tobacco products, including cigarettes, chewing tobacco, or electronic cigarettes. If you need help quitting, ask your health care provider.  It is important to eat a healthy diet and maintain a healthy weight. ? Be sure to include plenty of vegetables, fruits, low-fat dairy products, and lean protein. ? Avoid eating foods that are high in solid fats, added sugars, or salt (sodium).  Get regular exercise. This is one of the most important things that you can do for your health. ? Try to exercise for at least 150 minutes each week. The type of exercise that you do should increase your heart rate and make you sweat. This is known as moderate-intensity exercise. ? Try to do strengthening exercises at least twice each week. Do these in addition to the moderate-intensity exercise.  Know your numbers.Ask your health care provider to check your cholesterol and your blood glucose. Continue to have your blood tested as directed by your health care provider.  What should I know about cancer screening? There are several types of cancer. Take the following steps to reduce your risk and to catch any cancer development as early as possible. Breast Cancer  Practice breast self-awareness. ? This means understanding how your breasts normally appear and feel. ? It also means doing regular breast self-exams. Let your health care provider know about any changes, no matter how small.  If you are 40  or older, have a clinician do a breast exam (clinical breast exam or CBE) every year. Depending on your age, family history, and medical history, it may be recommended that you also have a yearly breast X-ray (mammogram).  If you  have a family history of breast cancer, talk with your health care provider about genetic screening.  If you are at high risk for breast cancer, talk with your health care provider about having an MRI and a mammogram every year.  Breast cancer (BRCA) gene test is recommended for women who have family members with BRCA-related cancers. Results of the assessment will determine the need for genetic counseling and BRCA1 and for BRCA2 testing. BRCA-related cancers include these types: ? Breast. This occurs in males or females. ? Ovarian. ? Tubal. This may also be called fallopian tube cancer. ? Cancer of the abdominal or pelvic lining (peritoneal cancer). ? Prostate. ? Pancreatic.  Cervical, Uterine, and Ovarian Cancer Your health care provider may recommend that you be screened regularly for cancer of the pelvic organs. These include your ovaries, uterus, and vagina. This screening involves a pelvic exam, which includes checking for microscopic changes to the surface of your cervix (Pap test).  For women ages 21-65, health care providers may recommend a pelvic exam and a Pap test every three years. For women ages 79-65, they may recommend the Pap test and pelvic exam, combined with testing for human papilloma virus (HPV), every five years. Some types of HPV increase your risk of cervical cancer. Testing for HPV may also be done on women of any age who have unclear Pap test results.  Other health care providers may not recommend any screening for nonpregnant women who are considered low risk for pelvic cancer and have no symptoms. Ask your health care provider if a screening pelvic exam is right for you.  If you have had past treatment for cervical cancer or a condition that could lead to cancer, you need Pap tests and screening for cancer for at least 20 years after your treatment. If Pap tests have been discontinued for you, your risk factors (such as having a new sexual partner) need to be  reassessed to determine if you should start having screenings again. Some women have medical problems that increase the chance of getting cervical cancer. In these cases, your health care provider may recommend that you have screening and Pap tests more often.  If you have a family history of uterine cancer or ovarian cancer, talk with your health care provider about genetic screening.  If you have vaginal bleeding after reaching menopause, tell your health care provider.  There are currently no reliable tests available to screen for ovarian cancer.  Lung Cancer Lung cancer screening is recommended for adults 69-62 years old who are at high risk for lung cancer because of a history of smoking. A yearly low-dose CT scan of the lungs is recommended if you:  Currently smoke.  Have a history of at least 30 pack-years of smoking and you currently smoke or have quit within the past 15 years. A pack-year is smoking an average of one pack of cigarettes per day for one year.  Yearly screening should:  Continue until it has been 15 years since you quit.  Stop if you develop a health problem that would prevent you from having lung cancer treatment.  Colorectal Cancer  This type of cancer can be detected and can often be prevented.  Routine colorectal cancer screening usually begins at  age 42 and continues through age 45.  If you have risk factors for colon cancer, your health care provider may recommend that you be screened at an earlier age.  If you have a family history of colorectal cancer, talk with your health care provider about genetic screening.  Your health care provider may also recommend using home test kits to check for hidden blood in your stool.  A small camera at the end of a tube can be used to examine your colon directly (sigmoidoscopy or colonoscopy). This is done to check for the earliest forms of colorectal cancer.  Direct examination of the colon should be repeated every  5-10 years until age 71. However, if early forms of precancerous polyps or small growths are found or if you have a family history or genetic risk for colorectal cancer, you may need to be screened more often.  Skin Cancer  Check your skin from head to toe regularly.  Monitor any moles. Be sure to tell your health care provider: ? About any new moles or changes in moles, especially if there is a change in a mole's shape or color. ? If you have a mole that is larger than the size of a pencil eraser.  If any of your family members has a history of skin cancer, especially at a young age, talk with your health care provider about genetic screening.  Always use sunscreen. Apply sunscreen liberally and repeatedly throughout the day.  Whenever you are outside, protect yourself by wearing long sleeves, pants, a wide-brimmed hat, and sunglasses.  What should I know about osteoporosis? Osteoporosis is a condition in which bone destruction happens more quickly than new bone creation. After menopause, you may be at an increased risk for osteoporosis. To help prevent osteoporosis or the bone fractures that can happen because of osteoporosis, the following is recommended:  If you are 46-71 years old, get at least 1,000 mg of calcium and at least 600 mg of vitamin D per day.  If you are older than age 55 but younger than age 65, get at least 1,200 mg of calcium and at least 600 mg of vitamin D per day.  If you are older than age 54, get at least 1,200 mg of calcium and at least 800 mg of vitamin D per day.  Smoking and excessive alcohol intake increase the risk of osteoporosis. Eat foods that are rich in calcium and vitamin D, and do weight-bearing exercises several times each week as directed by your health care provider. What should I know about how menopause affects my mental health? Depression may occur at any age, but it is more common as you become older. Common symptoms of depression  include:  Low or sad mood.  Changes in sleep patterns.  Changes in appetite or eating patterns.  Feeling an overall lack of motivation or enjoyment of activities that you previously enjoyed.  Frequent crying spells.  Talk with your health care provider if you think that you are experiencing depression. What should I know about immunizations? It is important that you get and maintain your immunizations. These include:  Tetanus, diphtheria, and pertussis (Tdap) booster vaccine.  Influenza every year before the flu season begins.  Pneumonia vaccine.  Shingles vaccine.  Your health care provider may also recommend other immunizations. This information is not intended to replace advice given to you by your health care provider. Make sure you discuss any questions you have with your health care provider. Document Released: 08/11/2005  Document Revised: 01/07/2016 Document Reviewed: 03/23/2015 Elsevier Interactive Patient Education  2018 [employer]  

## 2017-07-13 NOTE — Telephone Encounter (Signed)
If patient is willing we could try PT.

## 2017-07-13 NOTE — Progress Notes (Signed)
Subjective:   Kathleen Tran is a 77 y.o. female who presents for Medicare Annual (Subsequent) preventive examination.  Review of Systems:  N/A  Cardiac Risk Factors include: advanced age (>91mn, >>81women);diabetes mellitus;dyslipidemia;hypertension;obesity (BMI >30kg/m2)     Objective:     Vitals: BP 122/72 (BP Location: Left Arm)   Pulse 64   Temp (!) 97.5 F (36.4 C) (Oral)   Ht 4' 10.25" (1.48 m)   Wt 194 lb 3.2 oz (88.1 kg)   BMI 40.24 kg/m   Body mass index is 40.24 kg/m.  Advanced Directives 07/13/2017 01/10/2017 04/12/2015  Does Patient Have a Medical Advance Directive? Yes Yes Yes  Type of Advance Directive Living will - Living will  Copy of [place]? - - No - copy requested    Tobacco Social History   Tobacco Use  Smoking Status Never Smoker  Smokeless Tobacco Never Used     Counseling given: Not Answered   Clinical Intake:     Pain : 0-10 Pain Score: 9  Pain Type: Chronic pain Pain Location: Knee Pain Orientation: Left Pain Descriptors / Indicators: Throbbing Pain Frequency: Constant     Nutritional Status: BMI > 30  Obese Nutritional Risks: Nausea/ vomitting/ diarrhea(diarrhea occasionally due to Metformin and diet) Diabetes: Yes CBG done?: No Did pt. bring in CBG monitor from home?: No  How often do you need to have someone help you when you read instructions, pamphlets, or other written materials from your doctor or pharmacy?: 1 - Never  Interpreter Needed?: No  Information entered by :: Kathleen Tran  Past Medical History:  Diagnosis Date  . Arthritis   . GERD (gastroesophageal reflux disease)   . Hyperlipidemia    Past Surgical History:  Procedure Laterality Date  . APPENDECTOMY    . PARTIAL HIP ARTHROPLASTY Right   . TONSILLECTOMY     History reviewed. No pertinent family history. Social History   Socioeconomic History  . Marital status: Married    Spouse name: None  . Number of  children: 6  . Years of education: None  . Highest education level: GED or equivalent  Social Needs  . Financial resource strain: Not hard at all  . Food insecurity - worry: Never true  . Food insecurity - inability: Never true  . Transportation needs - medical: No  . Transportation needs - non-medical: No  Occupational History  . Occupation: retired  Tobacco Use  . Smoking status: Never Smoker  . Smokeless tobacco: Never Used  Substance and Sexual Activity  . Alcohol use: No    Frequency: Never  . Drug use: No  . Sexual activity: None  Other Topics Concern  . None  Social History Narrative  . None    Outpatient Encounter Medications as of 07/13/2017  Medication Sig  . aspirin 81 MG tablet Take 81 mg by mouth daily.  . Blood Glucose Monitoring Suppl (ONETOUCH VERIO IQ SYSTEM) W/DEVICE KIT Check blood sugar a.c.h.s.  . Calcium Carbonate-Vitamin D 600-125 MG-UNIT TABS Take by mouth daily.  .Marland KitchenglipiZIDE (GLUCOTROL) 5 MG tablet TAKE 1/2 TABLET BY MOUTH EVERY DAY BEFORE SUPPER  . glucose blood (ONETOUCH VERIO) test strip To check blood sugar once daily  . losartan (COZAAR) 50 MG tablet TAKE 1 TABLET(50 MG) BY MOUTH DAILY  . meloxicam (MOBIC) 15 MG tablet TK 1 T PO QD  . metFORMIN (GLUCOPHAGE) 1000 MG tablet TAKE 1 TABLET(1000 MG) BY MOUTH TWICE DAILY WITH A MEAL  . Multiple  Vitamin (MULTI VITAMIN DAILY PO) Take 1 tablet by mouth daily.  . Omega-3 Fatty Acids (FISH OIL) 1200 MG CAPS Take 1 capsule by mouth 2 (two) times daily.  Kathleen Tran DELICA LANCETS FINE MISC To check blood sugar once to twice daily  . pravastatin (PRAVACHOL) 80 MG tablet TAKE 1 TABLET(80 MG) BY MOUTH DAILY   No facility-administered encounter medications on file as of 07/13/2017.     Activities of Daily Living In your present state of health, do you have any difficulty performing the following activities: 07/13/2017 07/13/2016  Hearing? Y Y  Comment Certain pitches Sometimes  Vision? Y Y  Comment Due to  blurred vision, pt states she is waiting to get a new prescription. sometimes  Difficulty concentrating or making decisions? N N  Walking or climbing stairs? Y Y  Comment Due to left knee pain Climbing the stairs  Dressing or bathing? N N  Doing errands, shopping? N N  Preparing Food and eating ? N -  Using the Toilet? N -  In the past six months, have you accidently leaked urine? N -  Do you have problems with loss of bowel control? N -  Managing your Medications? N -  Managing your Finances? N -  Housekeeping or managing your Housekeeping? N -  Some recent data might be hidden    Patient Care Team: Kathleen Tran as PCP - General (Physician Assistant) Kathleen Leys, MD as Consulting Physician (Specialist)    Assessment:   This is a routine wellness examination for Kathleen Tran.  Exercise Activities and Dietary recommendations Current Exercise Habits: The patient does not participate in regular exercise at present, Exercise limited by: orthopedic condition(s)  Goals    . DIET - REDUCE SUGAR INTAKE     Recommend cutting out sweets in daily diet. Pt to avoid eating desserts to help aid in weight loss and help diabetes.        Fall Risk Fall Risk  07/13/2017 07/13/2016  Falls in the past year? No No   Is the patient's home free of loose throw rugs in walkways, pet beds, electrical cords, etc?   yes      Grab bars in the bathroom? no, pt states she will be getting these in the near future      Handrails on the stairs?   yes      Adequate lighting?   yes  Timed Get Up and Go performed: N/A  Depression Screen PHQ 2/9 Scores 07/13/2017 07/13/2016  PHQ - 2 Score 0 0     Cognitive Function: Pt declined screening today.         Immunization History  Administered Date(s) Administered  . Pneumococcal Conjugate-13 05/19/2013  . Pneumococcal Polysaccharide-23 01/05/2006, 05/03/2009  . Td 08/19/2009  . Tdap 08/19/2009  . Zoster 08/19/2009    Qualifies for Shingles  Vaccine? Due for Shingles vaccine. Declined my offer to administer today. Education has been provided regarding the importance of this vaccine. Pt has been advised to call her insurance company to determine her out of pocket expense. Advised she may also receive this vaccine at her local pharmacy or Health Dept. Verbalized acceptance and understanding.  Screening Tests Health Maintenance  Topic Date Due  . FOOT EXAM  07/15/2016  . HEMOGLOBIN A1C  07/13/2017  . INFLUENZA VACCINE  11/30/2017 (Originally 01/31/2017)  . COLON CANCER SCREENING ANNUAL FOBT  10/23/2017  . OPHTHALMOLOGY EXAM  07/04/2018  . MAMMOGRAM  08/21/2018  . TETANUS/TDAP  08/20/2019  .  DEXA SCAN  Completed  . PNA vac Low Risk Adult  Completed    Cancer Screenings: Lung: Low Dose CT Chest recommended if Age 83-80 years, 30 pack-year currently smoking OR have quit w/in 15years. Patient does not qualify. Breast:  Up to date on Mammogram? Yes   Up to date of Bone Density/Dexa? Yes Colorectal: FOBT due 10/2017  Additional Screenings:  Hepatitis B/HIV/Syphillis: Pt declines today.  Hepatitis C Screening: Pt declines today.     Plan:  I have personally reviewed and addressed the Medicare Annual Wellness questionnaire and have noted the following in the patient's chart:  A. Medical and social history B. Use of alcohol, tobacco or illicit drugs  C. Current medications and supplements D. Functional ability and status E.  Nutritional status F.  Physical activity G. Advance directives H. List of other physicians I.  Hospitalizations, surgeries, and ER visits in previous 12 months J.  [place] such as hearing and vision if needed, cognitive and depression L. Referrals and appointments - none  In addition, I have reviewed and discussed with patient certain preventive protocols, quality metrics, and best practice recommendations. A written personalized care plan for preventive services as well as general preventive  health recommendations were provided to patient.  See attached scanned questionnaire for additional information.   Signed,  Fabio Neighbors, Tran Nurse Health Advisor   Nurse Recommendations: Pt needs a diabetic foot exam today. Pt declined the influenza vaccine.

## 2017-07-13 NOTE — Telephone Encounter (Signed)
Patient advised as below. Patient reports that her left leg is still very painful and is taking Meloxicam but reports mild symptom control. Patient wants to know if you have any other treatment suggestions. Please advise. sd

## 2017-07-13 NOTE — Progress Notes (Signed)
Patient: Kathleen Tran, Female    DOB: [birth year], 77 y.o.   MRN: [id number] Visit Date: 07/13/2017  Today's Provider: Mar Daring, PA-C   Chief Complaint  Patient presents with  . Annual Exam   Subjective:    Annual physical exam Kathleen Tran is a 77 y.o. female who presents today for health maintenance and complete physical. She feels well. She reports exercising none. She reports she is sleeping well. Patient was seen this morning with Mackenzie,LPN for her AWE. -----------------------------------------------------------------   Review of Systems  Constitutional: Negative.   HENT: Positive for nosebleeds, rhinorrhea and trouble swallowing.        Chronic issues  Eyes: Positive for itching.  Respiratory: Negative.   Cardiovascular: Negative.   Gastrointestinal: Positive for abdominal distention (chronic issues from gallstones; just watches diet) and abdominal pain.  Endocrine: Positive for cold intolerance and heat intolerance.  Genitourinary: Negative.   Musculoskeletal: Positive for arthralgias and back pain.  Skin: Negative.   Allergic/Immunologic: Positive for environmental allergies and food allergies.  Neurological: Negative.   Hematological: Negative.   Psychiatric/Behavioral: Negative.     Social History      She  reports that  has never smoked. she has never used smokeless tobacco. She reports that she does not drink alcohol or use drugs.       Social History   Socioeconomic History  . Marital status: Married    Spouse name: Not on file  . Number of children: 6  . Years of education: Not on file  . Highest education level: GED or equivalent  Social Needs  . Financial resource strain: Not hard at all  . Food insecurity - worry: Never true  . Food insecurity - inability: Never true  . Transportation needs - medical: No  . Transportation needs - non-medical: No  Occupational History  . Occupation: retired  Tobacco Use  . Smoking  status: Never Smoker  . Smokeless tobacco: Never Used  Substance and Sexual Activity  . Alcohol use: No    Frequency: Never  . Drug use: No  . Sexual activity: Not on file  Other Topics Concern  . Not on file  Social History Narrative  . Not on file    Past Medical History:  Diagnosis Date  . Arthritis   . GERD (gastroesophageal reflux disease)   . Hyperlipidemia      Patient Active Problem List   Diagnosis Date Noted  . Arthritis 04/12/2015  . Type 2 diabetes mellitus without complication, without long-term current use of insulin ([place]) 04/12/2015  . Osteoporosis 04/12/2015  . Hypercholesterolemia 04/12/2015  . Essential hypertension 04/12/2015    Past Surgical History:  Procedure Laterality Date  . APPENDECTOMY    . PARTIAL HIP ARTHROPLASTY Right   . TONSILLECTOMY      Family History        Family Status  Relation Name Status  . Mother  Deceased at age 23       cancer:Ovarian  . Father  Deceased at age 57       heart cancer        Her family history is not on file.     No Known Allergies   Current Outpatient Medications:  .  aspirin 81 MG tablet, Take 81 mg by mouth daily., Disp: , Rfl:  .  Blood Glucose Monitoring Suppl (ONETOUCH VERIO IQ SYSTEM) W/DEVICE KIT, Check blood sugar a.c.h.s., Disp: 1 kit, Rfl: 0 .  Calcium Carbonate-Vitamin D  600-125 MG-UNIT TABS, Take by mouth daily., Disp: , Rfl:  .  glipiZIDE (GLUCOTROL) 5 MG tablet, TAKE 1/2 TABLET BY MOUTH EVERY DAY BEFORE SUPPER, Disp: 90 tablet, Rfl: 1 .  glucose blood (ONETOUCH VERIO) test strip, To check blood sugar once daily, Disp: 100 each, Rfl: 3 .  losartan (COZAAR) 50 MG tablet, TAKE 1 TABLET(50 MG) BY MOUTH DAILY, Disp: 90 tablet, Rfl: 3 .  meloxicam (MOBIC) 15 MG tablet, TK 1 T PO QD, Disp: , Rfl: 1 .  metFORMIN (GLUCOPHAGE) 1000 MG tablet, TAKE 1 TABLET(1000 MG) BY MOUTH TWICE DAILY WITH A MEAL, Disp: 180 tablet, Rfl: 0 .  Multiple Vitamin (MULTI VITAMIN DAILY PO), Take 1 tablet by mouth  daily., Disp: , Rfl:  .  Omega-3 Fatty Acids (FISH OIL) 1200 MG CAPS, Take 1 capsule by mouth 2 (two) times daily., Disp: , Rfl:  .  ONETOUCH DELICA LANCETS FINE MISC, To check blood sugar once to twice daily, Disp: 100 each, Rfl: 3 .  pravastatin (PRAVACHOL) 80 MG tablet, TAKE 1 TABLET(80 MG) BY MOUTH DAILY, Disp: 90 tablet, Rfl: 3   Patient Care Team: Mar Daring, PA-C as PCP - General (Physician Assistant) Earnestine Leys, MD as Consulting Physician (Specialist)      Objective:  Vitals: BP 122/72 (BP Location: Left Arm)   Pulse 64   Temp (!) 97.5 F (36.4 C) (Oral)   Ht 4' 10.25" (1.48 m)   Wt 194 lb 3.2 oz (88.1 kg)   BMI 40.24 kg/m   Body mass index is 40.24 kg/m.   Physical Exam  Constitutional: She is oriented to person, place, and time. She appears well-developed and well-nourished.  HENT:  Head: Normocephalic and atraumatic.  Right Ear: Hearing, external ear and ear canal normal. Tympanic membrane is scarred.  Left Ear: Hearing, tympanic membrane, external ear and ear canal normal.  Nose: Nose normal.  Mouth/Throat: Uvula is midline, oropharynx is clear and moist and mucous membranes are normal.  Eyes: Conjunctivae and EOM are normal. Pupils are equal, round, and reactive to light.  Neck: Normal range of motion. Neck supple. Carotid bruit is not present.  Cardiovascular: Normal rate, regular rhythm, normal heart sounds and intact distal pulses.  Pulmonary/Chest: Effort normal and breath sounds normal. No respiratory distress. She has no wheezes.  Abdominal: Soft. Bowel sounds are normal. She exhibits no distension and no mass. There is no tenderness. There is no rebound and no guarding.  Musculoskeletal: Normal range of motion. She exhibits edema (1+ pitting edema).  Neurological: She is alert and oriented to person, place, and time. She has normal reflexes.  Skin: Skin is warm and dry.  Psychiatric: She has a normal mood and affect. Her behavior is normal.  Judgment and thought content normal.  Vitals reviewed.  Diabetic Foot Exam - Simple   Simple Foot Form Diabetic Foot exam was performed with the following findings:  Yes 07/13/2017 10:25 AM  Visual Inspection No deformities, no ulcerations, no other skin breakdown bilaterally:  Yes See comments:  Yes Sensation Testing Intact to touch and monofilament testing bilaterally:  Yes Pulse Check Posterior Tibialis and Dorsalis pulse intact bilaterally:  Yes Comments Onychomycosis and thick nails, has difficulty with nail care     Depression Screen PHQ 2/9 Scores 07/13/2017 07/13/2016  PHQ - 2 Score 0 0      Assessment & Plan:     Routine Health Maintenance and Physical Exam  Exercise Activities and Dietary recommendations Goals    . DIET -  REDUCE SUGAR INTAKE     Recommend cutting out sweets in daily diet. Pt to avoid eating desserts to help aid in weight loss and help diabetes.        Immunization History  Administered Date(s) Administered  . Pneumococcal Conjugate-13 05/19/2013  . Pneumococcal Polysaccharide-23 01/05/2006, 05/03/2009  . Td 08/19/2009  . Tdap 08/19/2009  . Zoster 08/19/2009    Health Maintenance  Topic Date Due  . FOOT EXAM  07/15/2016  . HEMOGLOBIN A1C  07/13/2017  . INFLUENZA VACCINE  11/30/2017 (Originally 01/31/2017)  . COLON CANCER SCREENING ANNUAL FOBT  10/23/2017  . OPHTHALMOLOGY EXAM  07/04/2018  . MAMMOGRAM  08/21/2018  . TETANUS/TDAP  08/20/2019  . DEXA SCAN  Completed  . PNA vac Low Risk Adult  Completed     Discussed health benefits of physical activity, and encouraged her to engage in regular exercise appropriate for her age and condition.    1. Annual physical exam Normal physical exam today. Will check labs as below and f/u pending lab results. If labs are stable and WNL she will not need to have these rechecked for one year at her next annual physical exam. She is to call the office in the meantime if she has any acute issue,  questions or concerns. - CBC with Differential/Platelet - Comprehensive metabolic panel - TSH  2. Colon cancer screening No previous colonoscopy. No family history. Patient agreeable to cologuard which was ordered as below.  - Cologuard  3. Type 2 diabetes mellitus without complication, without long-term current use of insulin ([facility]) Stable on metformin 10105m, glipizide 554m Referral placed to podiatry for foot care and onychomycosis management as patient is not having improvements with OTC topicals. Will check labs as below and f/u pending results. - Hemoglobin A1c - Ambulatory referral to Podiatry  4. Hypercholesterolemia Stable on pravastatin 8025mWill check labs as below and f/u pending results. - Lipid panel  5. Onychomycosis See above medical treatment plan for #3. - Ambulatory referral to Podiatry  6. 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 Would like to have back rechecked since it has been "many years" since and since she is having worsening leg pain.  - DG Lumbar Spine Complete; Future  --------------------------------------------------------------------    JenMar DaringA-C  [facility]

## 2017-07-13 NOTE — Telephone Encounter (Signed)
Pt advised and she is willing to try PT, I can put order in but what diagnoses you want to Joaquin Music, RMA

## 2017-07-13 NOTE — Patient Instructions (Signed)
Kathleen Tran , Thank you for taking time to come for your Medicare Wellness Visit. I appreciate your ongoing commitment to your health goals. Please review the following plan we discussed and let me know if I can assist you in the future.   Screening recommendations/referrals: Colonoscopy: FOBT due 10/2017 Mammogram: Up to date Bone Density: Up to date Recommended yearly ophthalmology/optometry visit for glaucoma screening and checkup Recommended yearly dental visit for hygiene and checkup  Vaccinations: Influenza vaccine: Pt declines today.  Pneumococcal vaccine: Up to date Tdap vaccine: Up to date Shingles vaccine: Pt declines today.     Advanced directives: Please bring a copy of your POA (Power of Attorney) and/or Living Will to your next appointment.   Conditions/risks identified: Obesity- recommend cutting out sweets in daily diet. Pt to avoid eating desserts to help aid in weight loss and help diabetes.   Next appointment: 9:00 AM today   Preventive Care 21 Years and Older, Female Preventive care refers to lifestyle choices and visits with your health care provider that can promote health and wellness. What does preventive care include?  A yearly physical exam. This is also called an annual well check.  Dental exams once or twice a year.  Routine eye exams. Ask your health care provider how often you should have your eyes checked.  Personal lifestyle choices, including:  Daily care of your teeth and gums.  Regular physical activity.  Eating a healthy diet.  Avoiding tobacco and drug use.  Limiting alcohol use.  Practicing safe sex.  Taking low-dose aspirin every day.  Taking vitamin and mineral supplements as recommended by your health care provider. What happens during an annual well check? The services and screenings done by your health care provider during your annual well check will depend on your age, overall health, lifestyle risk factors, and family  history of disease. Counseling  Your health care provider may ask you questions about your:  Alcohol use.  Tobacco use.  Drug use.  Emotional well-being.  Home and relationship well-being.  Sexual activity.  Eating habits.  History of falls.  Memory and ability to understand (cognition).  Work and work Statistician.  Reproductive health. Screening  You may have the following tests or measurements:  Height, weight, and BMI.  Blood pressure.  Lipid and cholesterol levels. These may be checked every 5 years, or more frequently if you are over 62 years old.  Skin check.  Lung cancer screening. You may have this screening every year starting at age 87 if you have a 30-pack-year history of smoking and currently smoke or have quit within the past 15 years.  Fecal occult blood test (FOBT) of the stool. You may have this test every year starting at age 58.  Flexible sigmoidoscopy or colonoscopy. You may have a sigmoidoscopy every 5 years or a colonoscopy every 10 years starting at age 83.  Hepatitis C blood test.  Hepatitis B blood test.  Sexually transmitted disease (STD) testing.  Diabetes screening. This is done by checking your blood sugar (glucose) after you have not eaten for a while (fasting). You may have this done every 1-3 years.  Bone density scan. This is done to screen for osteoporosis. You may have this done starting at age 1.  Mammogram. This may be done every 1-2 years. Talk to your health care provider about how often you should have regular mammograms. Talk with your health care provider about your test results, treatment options, and if necessary, the need for  more tests. Vaccines  Your health care provider may recommend certain vaccines, such as:  Influenza vaccine. This is recommended every year.  Tetanus, diphtheria, and acellular pertussis (Tdap, Td) vaccine. You may need a Td booster every 10 years.  Zoster vaccine. You may need this after  age 44.  Pneumococcal 13-valent conjugate (PCV13) vaccine. One dose is recommended after age 75.  Pneumococcal polysaccharide (PPSV23) vaccine. One dose is recommended after age 21. Talk to your health care provider about which screenings and vaccines you need and how often you need them. This information is not intended to replace advice given to you by your health care provider. Make sure you discuss any questions you have with your health care provider. Document Released: 07/16/2015 Document Revised: 03/08/2016 Document Reviewed: 04/20/2015 Elsevier Interactive Patient Education  2017 [place] Prevention in the Home Falls can cause injuries. They can happen to people of all ages. There are many things you can do to make your home safe and to help prevent falls. What can I do on the outside of my home?  Regularly fix the edges of walkways and driveways and fix any cracks.  Remove anything that might make you trip as you walk through a door, such as a raised step or threshold.  Trim any bushes or trees on the path to your home.  Use bright outdoor lighting.  Clear any walking paths of anything that might make someone trip, such as rocks or tools.  Regularly check to see if handrails are loose or broken. Make sure that both sides of any steps have handrails.  Any raised decks and porches should have guardrails on the edges.  Have any leaves, snow, or ice cleared regularly.  Use sand or salt on walking paths during winter.  Clean up any spills in your garage right away. This includes oil or grease spills. What can I do in the bathroom?  Use night lights.  Install grab bars by the toilet and in the tub and shower. Do not use towel bars as grab bars.  Use non-skid mats or decals in the tub or shower.  If you need to sit down in the shower, use a plastic, non-slip stool.  Keep the floor dry. Clean up any water that spills on the floor as soon as it  happens.  Remove soap buildup in the tub or shower regularly.  Attach bath mats securely with double-sided non-slip rug tape.  Do not have throw rugs and other things on the floor that can make you trip. What can I do in the bedroom?  Use night lights.  Make sure that you have a light by your bed that is easy to reach.  Do not use any sheets or blankets that are too big for your bed. They should not hang down onto the floor.  Have a firm chair that has side arms. You can use this for support while you get dressed.  Do not have throw rugs and other things on the floor that can make you trip. What can I do in the kitchen?  Clean up any spills right away.  Avoid walking on wet floors.  Keep items that you use a lot in easy-to-reach places.  If you need to reach something above you, use a strong step stool that has a grab bar.  Keep electrical cords out of the way.  Do not use floor polish or wax that makes floors slippery. If you must use wax, use non-skid  floor wax.  Do not have throw rugs and other things on the floor that can make you trip. What can I do with my stairs?  Do not leave any items on the stairs.  Make sure that there are handrails on both sides of the stairs and use them. Fix handrails that are broken or loose. Make sure that handrails are as long as the stairways.  Check any carpeting to make sure that it is firmly attached to the stairs. Fix any carpet that is loose or worn.  Avoid having throw rugs at the top or bottom of the stairs. If you do have throw rugs, attach them to the floor with carpet tape.  Make sure that you have a light switch at the top of the stairs and the bottom of the stairs. If you do not have them, ask someone to add them for you. What else can I do to help prevent falls?  Wear shoes that:  Do not have high heels.  Have rubber bottoms.  Are comfortable and fit you well.  Are closed at the toe. Do not wear sandals.  If you  use a stepladder:  Make sure that it is fully opened. Do not climb a closed stepladder.  Make sure that both sides of the stepladder are locked into place.  Ask someone to hold it for you, if possible.  Clearly mark and make sure that you can see:  Any grab bars or handrails.  First and last steps.  Where the edge of each step is.  Use tools that help you move around (mobility aids) if they are needed. These include:  Canes.  Walkers.  Scooters.  Crutches.  Turn on the lights when you go into a dark area. Replace any light bulbs as soon as they burn out.  Set up your furniture so you have a clear path. Avoid moving your furniture around.  If any of your floors are uneven, fix them.  If there are any pets around you, be aware of where they are.  Review your medicines with your doctor. Some medicines can make you feel dizzy. This can increase your chance of falling. Ask your doctor what other things that you can do to help prevent falls. This information is not intended to replace advice given to you by your health care provider. Make sure you discuss any questions you have with your health care provider. Document Released: 04/15/2009 Document Revised: 11/25/2015 Document Reviewed: 07/24/2014 Elsevier Interactive Patient Education  2017 [employer].

## 2017-07-13 NOTE — Telephone Encounter (Signed)
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

## 2017-07-13 NOTE — Telephone Encounter (Signed)
Referral placed.

## 2017-07-14 LAB — CBC WITH DIFFERENTIAL/PLATELET
Basophils Absolute: 0 10*3/uL (ref 0.0–0.2)
Basos: 1 %
EOS (ABSOLUTE): 0.3 10*3/uL (ref 0.0–0.4)
EOS: 5 %
HEMATOCRIT: 40.5 % (ref 34.0–46.6)
Hemoglobin: 13.4 g/dL (ref 11.1–15.9)
Immature Grans (Abs): 0 10*3/uL (ref 0.0–0.1)
Immature Granulocytes: 0 %
LYMPHS ABS: 1.5 10*3/uL (ref 0.7–3.1)
Lymphs: 24 %
MCH: 31.1 pg (ref 26.6–33.0)
MCHC: 33.1 g/dL (ref 31.5–35.7)
MCV: 94 fL (ref 79–97)
MONOS ABS: 0.5 10*3/uL (ref 0.1–0.9)
Monocytes: 8 %
Neutrophils Absolute: 3.8 10*3/uL (ref 1.4–7.0)
Neutrophils: 62 %
Platelets: 331 10*3/uL (ref 150–379)
RBC: 4.31 x10E6/uL (ref 3.77–5.28)
RDW: 14.4 % (ref 12.3–15.4)
WBC: 6.1 10*3/uL (ref 3.4–10.8)

## 2017-07-14 LAB — COMPREHENSIVE METABOLIC PANEL
A/G RATIO: 1.6 (ref 1.2–2.2)
ALK PHOS: 65 IU/L (ref 39–117)
ALT: 17 IU/L (ref 0–32)
AST: 19 IU/L (ref 0–40)
Albumin: 4.2 g/dL (ref 3.5–4.8)
BILIRUBIN TOTAL: 0.3 mg/dL (ref 0.0–1.2)
BUN/Creatinine Ratio: 28 (ref 12–28)
BUN: 17 mg/dL (ref 8–27)
CHLORIDE: 105 mmol/L (ref 96–106)
CO2: 27 mmol/L (ref 20–29)
Calcium: 10.2 mg/dL (ref 8.7–10.3)
Creatinine, Ser: 0.6 mg/dL (ref 0.57–1.00)
GFR calc Af Amer: 104 mL/min/{1.73_m2} (ref >59)
GFR calc non Af Amer: 90 mL/min/{1.73_m2} (ref >59)
GLOBULIN, TOTAL: 2.7 g/dL (ref 1.5–4.5)
Glucose: 138 mg/dL — ABNORMAL HIGH (ref 65–99)
Potassium: 5.4 mmol/L — ABNORMAL HIGH (ref 3.5–5.2)
SODIUM: 145 mmol/L — AB (ref 134–144)
Total Protein: 6.9 g/dL (ref 6.0–8.5)

## 2017-07-14 LAB — LIPID PANEL
Chol/HDL Ratio: 2.7 ratio (ref 0.0–4.4)
Cholesterol, Total: 188 mg/dL (ref 100–199)
HDL: 70 mg/dL (ref >39)
LDL Calculated: 85 mg/dL (ref 0–99)
Triglycerides: 163 mg/dL — ABNORMAL HIGH (ref 0–149)
VLDL Cholesterol Cal: 33 mg/dL (ref 5–40)

## 2017-07-14 LAB — HEMOGLOBIN A1C
ESTIMATED AVERAGE GLUCOSE: 137 mg/dL
HEMOGLOBIN A1C: 6.4 % — AB (ref 4.8–5.6)

## 2017-07-14 LAB — TSH: TSH: 2.68 u[IU]/mL (ref 0.450–4.500)

## 2017-07-17 ENCOUNTER — Telehealth: Payer: Self-pay | Admitting: *Deleted

## 2017-07-17 DIAGNOSIS — E875 Hyperkalemia: Secondary | ICD-10-CM

## 2017-07-17 NOTE — Telephone Encounter (Signed)
Patient was advised of results. Will follow-up to recheck potassium and sodium be a lab visit only?

## 2017-07-17 NOTE — Telephone Encounter (Signed)
-----   Message from Mar Daring, PA-C sent at 07/17/2017  1:23 PM EST ----- A1c good at 6.4.  Sodium and potassium borderline high. Would recommend holding OTC multi-vitamin supplements and rechecking in 2 weeks. May have to change losartan if potassium remains elevated. Cholesterol stable. Kidney and liver function stable. Blood count normal.

## 2017-07-17 NOTE — Telephone Encounter (Signed)
Yes lab only

## 2017-07-17 NOTE — Telephone Encounter (Signed)
LMOVM for pt to return call 

## 2017-07-18 ENCOUNTER — Telehealth: Payer: Self-pay | Admitting: Physician Assistant

## 2017-07-18 NOTE — Telephone Encounter (Signed)
Patient was advised. Lab slip printed.  

## 2017-07-18 NOTE — Telephone Encounter (Signed)
Order for cologuard faxed to [facility] °

## 2017-08-01 ENCOUNTER — Ambulatory Visit: Payer: Medicare Other | Attending: Physician Assistant

## 2017-08-01 ENCOUNTER — Other Ambulatory Visit: Payer: Self-pay

## 2017-08-01 DIAGNOSIS — M6281 Muscle weakness (generalized): Secondary | ICD-10-CM | POA: Diagnosis present

## 2017-08-01 DIAGNOSIS — G8929 Other chronic pain: Secondary | ICD-10-CM | POA: Diagnosis present

## 2017-08-01 DIAGNOSIS — M5442 Lumbago with sciatica, left side: Secondary | ICD-10-CM | POA: Insufficient documentation

## 2017-08-01 DIAGNOSIS — M79605 Pain in left leg: Secondary | ICD-10-CM | POA: Diagnosis present

## 2017-08-01 NOTE — Therapy (Cosign Needed)
[place] [facility] [address], [place], [address] Phone: [phone]   Fax:  [phone]  Physical Therapy Evaluation  Patient Details  Name: Kathleen Tran MRN: [id number] Date of Birth: September 24, 1940 Referring Provider: [name]   Encounter Date: 08/01/2017  PT End of Session - 08/01/17 1645    Visit Number  1    Number of Visits  13    Date for PT Re-Evaluation  09/12/17    PT Start Time  2505    PT Stop Time  1615    PT Time Calculation (min)  60 min    Activity Tolerance  Patient tolerated treatment well    Behavior During Therapy  [facility] for tasks assessed/performed       Past Medical History:  Diagnosis Date  . Arthritis   . GERD (gastroesophageal reflux disease)   . Hyperlipidemia     Past Surgical History:  Procedure Laterality Date  . APPENDECTOMY    . PARTIAL HIP ARTHROPLASTY Right   . TONSILLECTOMY      There were no vitals filed for this visit.   Subjective Assessment - 08/01/17 1659    Subjective  Patient reports pain in L buttock radiating to L knee. Patient states L knee pain is currently 2/10. Patient states that worst L knee pain is 10/10, and that best L knee pain is 0/10. Pt reports left knee "locks" and that she does not exerpience any numbness or tingling. Pt reports no LBP recently and that she has been performing exrecises at home prescribed by her othopedist for her back.     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  Yes    Pain Score  2     Pain Location  Knee    Pain Orientation  Left    Pain Descriptors / Indicators  Aching;Radiating    Pain Type  Chronic pain    Pain Onset  More than a month ago    Pain Frequency  Constant    Aggravating Factors   household chores          [facility] PT Assessment - 08/02/17 0001      Assessment   Referring Provider  [name]    Hand Dominance  Right    Next MD Visit  unknown    Prior Therapy  no      Precautions   Precautions  None      Home Environment   Living Environment  Private residence    Living Arrangements  Spouse/significant other;Children    Home Access  Level entry    [place]  Two level    Alternate Level Stairs-Number of Steps  Golden  None      Prior Function   Level of Cook  Retired    Leisure  go out to dinner, go out to movies      Observation/Other Assessments   Modified [place]  48%      AROM   Overall AROM Comments  impaired    Lumbar Flexion  WNL with pain at end range    Lumbar Extension  WNL with pain at end range    Lumbar -  Right Side Bend  WNL with pain at end range    Lumbar - Left Side Bend  WNL with pain at end range    Lumbar - Right Rotation  WNL    Lumbar - Left Rotation  WNL retested in sitting which provoked L knee pain      Strength   Overall Strength Comments  impaired    Right Hip Flexion  4/5    Right Hip Extension  4/5    Right Hip ABduction  3/5 painful    Right Hip ADduction  4+/5    Left Hip Flexion  4/5    Left Hip Extension  4/5    Left Hip ABduction  4+/5 painful    Left Hip ADduction  4+/5      Palpation   Palpation comment  Pt TTP from thoracic spine to sacrum; TTP along gluteal muscles      Slump test   Findings  Positive    Side  Left       Treatment:  Therapeutic exercise:  Seated glute squeezes -- 1x20 to increase activation of gluteal muscles Sit to stands without UE suppor-- t 1x15 to increase LE strength and improve technique BTB seated hip abduction -- to increase strength of hip abductors   Patient demonstrates increased fatigue with exercise at end of session.       PT Education - 08/01/17 1704    Education provided  Yes    Education Details  HEP: seated  BTB hip abduction, seated glute squeezes    Person(s) Educated  Patient    Methods  Explanation;Demonstration;Tactile cues;Verbal cues    Comprehension  Verbalized understanding;Returned demonstration          PT Long Term Goals - 08/01/17 1647      PT LONG TERM GOAL #1   Title  Patient will be independent with HEP to continue the benefits of therapy after discharge.    Baseline  Patient requires cueing to perform HEP    Time  6    Period  Weeks    Status  New    Target Date  09/12/17      PT LONG TERM GOAL #2   Title  Patient's worst LBP and L LE pain will decrease from 10/10 to 5/10 in six weeks for patient to return to ADLs without increased pain.    Baseline  Patient's worst pain is currently 10/10.    Time  6    Period  Weeks    Status  New    Target Date  09/12/17      PT LONG TERM GOAL #3   Title  Patient's Modified Oswestry Low Back Pain score will decrease from 48% to 30% so patient can stand for more than 10 minutes without increased pain.    Baseline  Patient currently cannot stand for more than 10 minutes without increased pain    Time  6    Period  Weeks    Target Date  09/12/17      PT LONG TERM GOAL #4   Title  Patient will increase hip flexor strength bilaterally from 4/5 to 5/5 in order for patient to lift her legs to get in her car.    Baseline  Patient cannot currently lift her legs to get in her car without assistance.    Time  6    Period  Weeks    Status  New    Target Date  09/12/17  Plan - 08/01/17 1707    Clinical Impression Statement  Patient is a 77 yo, right hand dominant female presenting with chronic LBP and L LE pain. Patient demonstrates dysfunction of low back and both LEs indicated by increased pain with AROM and strength testing.  MMT also reveals weakness of both LEs consistent with pt's reported decrease in activity and increase in pain with MMT. Assessment of spinal segments reveals pt is TTP throughout the entirety  of thoracic spine to the sacrum, consistent with pt's overly flexed posture and radiating pain down the L LE.  The aforementioned symptoms are also consistent with a left-sided radiculopathy. Patient will benefit from further skilled therapy to return to prior levels of function.    Clinical Presentation  Evolving    Clinical Presentation due to:  Patient reports pain is worsening    Rehab Potential  Fair    Clinical Impairments Affecting Rehab Potential  + pt is motivated to do well in therapy, pt has had success with HEP prescribed by orthopedist; - multiple comorbidities    PT Frequency  2x / week    PT Duration  6 weeks    PT Treatment/Interventions  ADLs/Self Care Home Management;Cryotherapy;Electrical Stimulation;Moist Heat;Iontophoresis 4mg /ml Dexamethasone;Ultrasound;Traction;Parrafin;Contrast Bath;Gait training;Stair training;Functional mobility training;Therapeutic activities;Therapeutic exercise;Balance training;Neuromuscular re-education;Patient/family education;Manual techniques;Compression bandaging;Scar mobilization;Passive range of motion;Dry needling;Energy conservation;Splinting;Taping    PT Next Visit Plan  manual therapy, strength and endruance training of hip and LEs    PT Home Exercise Plan  see education    Consulted and Agree with Plan of Care  Patient       Patient will benefit from skilled therapeutic intervention in order to improve the following deficits and impairments:  Abnormal gait, Decreased coordination, Decreased range of motion, Difficulty walking, Increased fascial restricitons, Decreased endurance, Increased muscle spasms, Obesity, Decreased activity tolerance, Pain, Decreased balance, Hypomobility, Impaired flexibility, Improper body mechanics, Decreased mobility, Decreased strength, Postural dysfunction  Visit Diagnosis: Chronic bilateral low back pain with left-sided sciatica  Pain in left leg  Muscle weakness (generalized)     Problem List Patient  Active Problem List   Diagnosis Date Noted  . Arthritis 04/12/2015  . Type 2 diabetes mellitus without complication, without long-term current use of insulin ([place]) 04/12/2015  . Osteoporosis 04/12/2015  . Hypercholesterolemia 04/12/2015  . Essential hypertension 04/12/2015    Ricard Dillon, SPT 08/02/2017, 2:08 PM  [place] [facility] [address] [place], [address] Phone: [phone]   Fax:  [phone]  Name: Seraya Jobst MRN: [id number] Date of Birth: September 22, 1940

## 2017-08-07 ENCOUNTER — Ambulatory Visit: Payer: Medicare Other | Attending: Physician Assistant

## 2017-08-07 DIAGNOSIS — M6281 Muscle weakness (generalized): Secondary | ICD-10-CM | POA: Diagnosis present

## 2017-08-07 DIAGNOSIS — G8929 Other chronic pain: Secondary | ICD-10-CM | POA: Insufficient documentation

## 2017-08-07 DIAGNOSIS — M79605 Pain in left leg: Secondary | ICD-10-CM | POA: Diagnosis present

## 2017-08-07 DIAGNOSIS — M5442 Lumbago with sciatica, left side: Secondary | ICD-10-CM | POA: Diagnosis not present

## 2017-08-07 NOTE — Therapy (Signed)
[place] [facility] [address] [place], [address] Phone: [phone]   Fax:  [phone]  Physical Therapy Treatment  Patient Details  Name: Kathleen Tran MRN: [id number] Date of Birth: 07-Jul-1940 Referring Provider: [name]   Encounter Date: 08/07/2017  PT End of Session - 08/07/17 1616    Visit Number  2    Number of Visits  13    Date for PT Re-Evaluation  09/12/17    PT Start Time  5188    PT Stop Time  1600    PT Time Calculation (min)  45 min    Activity Tolerance  Patient tolerated treatment well    Behavior During Therapy  [facility] for tasks assessed/performed       Past Medical History:  Diagnosis Date  . Arthritis   . GERD (gastroesophageal reflux disease)   . Hyperlipidemia     Past Surgical History:  Procedure Laterality Date  . APPENDECTOMY    . PARTIAL HIP ARTHROPLASTY Right   . TONSILLECTOMY      There were no vitals filed for this visit.  Subjective Assessment - 08/07/17 1533    Subjective  Patient reports decrease in pain ever since the intial eval. Patient reports she now gets an occausional pain in the knee but is much less.     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  No/denies    Pain Onset  More than a month ago        TREATMENT   Therapeutic exercise:   Seated glute squeezes and ball squeezes in sitting -- x20 to increase activation of gluteal muscles; x20 with 2kg ball Standing hip abduction with UE support - 2 x 12 Single leg stance with UE support - 2 x 30sec Standing hip abduction with UE support RTB with ankles around ankles - x 10  Sit to stands without UE support-- 1x15 to increase LE strength and improve technique Hip abduction on hip machine - 10# x 20 on the R LE;  40# x 20 on L LE Seated Nustep level 3 for 2 min, level 5 for 1.5 min, level 4 for 1.5 min to improve LE and hip strength  Patient demonstrates increased fatigue with exercise at end of session.    PT Education - 08/07/17 1556    Education provided  Yes    Education Details  Form/technique with hip extension    Person(s) Educated  Patient    Methods  Explanation;Demonstration    Comprehension  Verbalized understanding;Returned demonstration          PT Long Term Goals - 08/01/17 1647      PT LONG TERM GOAL #1   Title  Patient will be independent with HEP to continue the benefits of therapy after discharge.    Baseline  Patient requires cueing to perform HEP    Time  6    Period  Weeks    Status  New    Target Date  09/12/17      PT LONG TERM GOAL #2   Title  Patient's worst LBP and L LE pain will decrease from 10/10 to 5/10 in six weeks for patient to return to ADLs without increased pain.    Baseline  Patient's worst pain is currently 10/10.    Time  6    Period  Weeks    Status  New    Target Date  09/12/17      PT LONG TERM GOAL #3   Title  Patient's Modified Oswestry Low Back Pain score will decrease from 48% to 30% so patient can stand for more than 10 minutes without increased pain.    Baseline  Patient currently cannot stand for more than 10 minutes without increased pain    Time  6    Period  Weeks    Target Date  09/12/17      PT LONG TERM GOAL #4   Title  Patient will increase hip flexor strength bilaterally from 4/5 to 5/5 in order for patient to lift her legs to get in her car.    Baseline  Patient cannot currently lift her legs to get in her car without assistance.    Time  6    Period  Weeks    Status  New    Target Date  09/12/17            Plan - 08/07/17 1617    Clinical Impression Statement  Patient reports improvement with exercise performance demosntrating ability to perform more challenging exercises without increase in pain. Although  patient's pain in improving, she continues to have increased pain along her knee with resisted hip abaudction indicating poor motor control of lumbar/hip musculature. Patient will benefit from further skilled therapy to return to prior level of function.      Rehab Potential  Fair    Clinical Impairments Affecting Rehab Potential  + pt is motivated to do well in therapy, pt has had success with HEP prescribed by orthopedist; - multiple comorbidities    PT Frequency  2x / week    PT Duration  6 weeks    PT Treatment/Interventions  ADLs/Self Care Home Management;Cryotherapy;Electrical Stimulation;Moist Heat;Iontophoresis 4mg /ml Dexamethasone;Ultrasound;Traction;Parrafin;Contrast Bath;Gait training;Stair training;Functional mobility training;Therapeutic activities;Therapeutic exercise;Balance training;Neuromuscular re-education;Patient/family education;Manual techniques;Compression bandaging;Scar mobilization;Passive range of motion;Dry needling;Energy conservation;Splinting;Taping    PT Next Visit Plan  manual therapy, strength and endruance training of hip and LEs    PT Home Exercise Plan  see education    Consulted and Agree with Plan of Care  Patient       Patient will benefit from skilled therapeutic intervention in order to improve the following deficits and impairments:  Abnormal gait, Decreased coordination, Decreased range of motion, Difficulty walking, Increased fascial restricitons, Decreased endurance, Increased muscle spasms, Obesity, Decreased activity tolerance, Pain, Decreased balance, Hypomobility, Impaired flexibility, Improper body mechanics, Decreased mobility, Decreased strength, Postural dysfunction  Visit Diagnosis: Chronic bilateral low back pain with left-sided sciatica  Pain in left leg  Muscle weakness (generalized)     Problem List Patient Active Problem List   Diagnosis Date Noted  . Arthritis 04/12/2015  . Type 2 diabetes mellitus without complication, without  long-term current use of insulin ([place]) 04/12/2015  . Osteoporosis 04/12/2015  . Hypercholesterolemia 04/12/2015  . Essential hypertension 04/12/2015    Blythe Stanford, PT DPT 08/07/2017, 4:21 PM  [place] [facility] [address], [place], [address] Phone: [phone]   Fax:  [phone]  Name: Kathleen Tran MRN: [id number] Date of Birth: 03/06/1941

## 2017-08-08 LAB — COLOGUARD: COLOGUARD: POSITIVE

## 2017-08-09 ENCOUNTER — Telehealth: Payer: Self-pay

## 2017-08-09 ENCOUNTER — Ambulatory Visit: Payer: Medicare Other

## 2017-08-09 DIAGNOSIS — M5442 Lumbago with sciatica, left side: Principal | ICD-10-CM

## 2017-08-09 DIAGNOSIS — G8929 Other chronic pain: Secondary | ICD-10-CM

## 2017-08-09 DIAGNOSIS — M79605 Pain in left leg: Secondary | ICD-10-CM

## 2017-08-09 DIAGNOSIS — M6281 Muscle weakness (generalized): Secondary | ICD-10-CM

## 2017-08-09 LAB — BASIC METABOLIC PANEL
BUN/Creatinine Ratio: 33 — ABNORMAL HIGH (ref 12–28)
BUN: 22 mg/dL (ref 8–27)
CALCIUM: 10.3 mg/dL (ref 8.7–10.3)
CO2: 24 mmol/L (ref 20–29)
CREATININE: 0.67 mg/dL (ref 0.57–1.00)
Chloride: 105 mmol/L (ref 96–106)
GFR, EST AFRICAN AMERICAN: 100 mL/min/{1.73_m2} (ref >59)
GFR, EST NON AFRICAN AMERICAN: 87 mL/min/{1.73_m2} (ref >59)
Glucose: 116 mg/dL — ABNORMAL HIGH (ref 65–99)
Potassium: 4.8 mmol/L (ref 3.5–5.2)
Sodium: 145 mmol/L — ABNORMAL HIGH (ref 134–144)

## 2017-08-09 NOTE — Therapy (Signed)
[place] [facility] [address], [place], [address] Phone: [phone]   Fax:  [phone]  Physical Therapy Treatment  Patient Details  Name: Kathleen Tran MRN: [id number] Date of Birth: [birth year] Referring Provider: [name]   Encounter Date: 08/09/2017  PT End of Session - 08/09/17 1530    Visit Number  3    Number of Visits  13    Date for PT Re-Evaluation  09/12/17    PT Start Time  5643    PT Stop Time  1600    PT Time Calculation (min)  45 min    Activity Tolerance  Patient tolerated treatment well    Behavior During Therapy  [facility] for tasks assessed/performed       Past Medical History:  Diagnosis Date  . Arthritis   . GERD (gastroesophageal reflux disease)   . Hyperlipidemia     Past Surgical History:  Procedure Laterality Date  . APPENDECTOMY    . PARTIAL HIP ARTHROPLASTY Right   . TONSILLECTOMY      There were no vitals filed for this visit.  Subjective Assessment - 08/09/17 1520    Subjective  Patient reports decrease in back pain but reports increased L knee pain today and states the pain is ~ an 8/10.    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  Yes    Pain Score  8     Pain Location  Knee    Pain Orientation  Left    Pain Descriptors / Indicators  Aching    Pain Type  Chronic pain    Pain Onset  More than a month ago    Pain Frequency  Constant         TREATMENT  Manual Therapy: STM to patient L quadriceps to decrease increased pain and spasms with patient positioned in sitting utilizing superficial techniques. Active release technique to quadriceps to improve pain. x1 held for 20 sec   Therapeutic exercise:  Seated knee isometrics - knee extension - 5 sec holds x 10 B Hip  abduction on hip machine - 10# x 20 on the R LE; 40# x 20 on L LE Sit to stands without UE support--  2 x 10 with one foot in front of the other to improve LE strengthening Hamstring isometrics - 5 sec holds x 5 Hip abduction in standing - x 5 stopped secondary to pain; x15 after performing isometrics  Seated glute squeezes and ball squeezes in sitting -- x20;  x20 with 2kg ball Bridges with cueing on improving abdominal and gluteal activation - 2x20 Nustep in sitting level 5 for 2.5 min, level 4 for 2.5  for improvement in quadriceps strength   Patient demonstrates increased fatigue with exercise at end of session.     PT Education - 08/09/17 1529    Education provided  Yes    Education Details  Educated on performing isometrics at home in sitting    Person(s) Educated  Patient    Methods  Explanation;Demonstration    Comprehension  Verbalized understanding;Returned demonstration          PT Long Term Goals - 08/01/17 1647      PT LONG TERM GOAL #1   Title  Patient will be independent with HEP to continue the benefits of therapy after discharge.    Baseline  Patient requires cueing to perform HEP    Time  6    Period  Weeks    Status  New    Target Date  09/12/17      PT LONG TERM GOAL #2   Title  Patient's worst LBP and L LE pain will decrease from 10/10 to 5/10 in six weeks for patient to return to ADLs without increased pain.    Baseline  Patient's worst pain is currently 10/10.    Time  6    Period  Weeks    Status  New    Target Date  09/12/17      PT LONG TERM GOAL #3   Title  Patient's Modified Oswestry Low Back Pain score will decrease from 48% to 30% so patient can stand for more than 10 minutes without increased pain.    Baseline  Patient currently cannot stand for more than 10 minutes without increased pain    Time  6    Period  Weeks    Target Date  09/12/17      PT LONG TERM GOAL #4   Title  Patient will increase hip flexor strength bilaterally from  4/5 to 5/5 in order for patient to lift her legs to get in her car.    Baseline  Patient cannot currently lift her legs to get in her car without assistance.    Time  6    Period  Weeks    Status  New    Target Date  09/12/17            Plan - 08/09/17 1532    Clinical Impression Statement  Patient demonstrates ncreased quadriceps muscular guarding and pain which is decreased after performing isometric contractions along the affected side. Patient continues  to have increased upper leg pain after performing hip abduction exercise but is much improved after performing isometrics. Patient will benefit from further skilled to return to prior level of function.     Rehab Potential  Fair    Clinical Impairments Affecting Rehab Potential  + pt is motivated to do well in therapy, pt has had success with HEP prescribed by orthopedist; - multiple comorbidities    PT Frequency  2x / week    PT Duration  6 weeks    PT Treatment/Interventions  ADLs/Self Care Home Management;Cryotherapy;Electrical Stimulation;Moist Heat;Iontophoresis 4mg /ml Dexamethasone;Ultrasound;Traction;Parrafin;Contrast Bath;Gait training;Stair training;Functional mobility training;Therapeutic activities;Therapeutic exercise;Balance training;Neuromuscular re-education;Patient/family education;Manual techniques;Compression bandaging;Scar mobilization;Passive range of motion;Dry needling;Energy conservation;Splinting;Taping    PT Next Visit Plan  manual therapy, strength and endruance training of hip and LEs    PT Home Exercise Plan  see education    Consulted and Agree with Plan of Care  Patient       Patient will benefit from skilled therapeutic intervention in order to improve the following deficits and impairments:  Abnormal gait, Decreased coordination, Decreased range of motion, Difficulty walking, Increased fascial restricitons, Decreased endurance, Increased muscle spasms, Obesity, Decreased activity tolerance, Pain,  Decreased balance, Hypomobility, Impaired flexibility, Improper body mechanics, Decreased mobility, Decreased strength, Postural dysfunction  Visit Diagnosis: Chronic bilateral low back pain with left-sided sciatica  Pain in left leg  Muscle weakness (generalized)     Problem List Patient Active Problem List   Diagnosis Date Noted  . Arthritis 04/12/2015  . Type 2 diabetes mellitus without complication, without long-term current use of insulin ([place]) 04/12/2015  .  Osteoporosis 04/12/2015  . Hypercholesterolemia 04/12/2015  . Essential hypertension 04/12/2015    Blythe Stanford, PT DPT 08/09/2017, 4:15 PM  [place] [facility] [address] [place], [address] Phone: [phone]   Fax:  [phone]  Name: Kathleen Tran MRN: [id number] Date of Birth: 08/04/1940

## 2017-08-09 NOTE — Telephone Encounter (Signed)
-----   Message from Mar Daring, PA-C sent at 08/09/2017 11:08 AM EST ----- Potassium normal now

## 2017-08-09 NOTE — Telephone Encounter (Signed)
No answer at cell or home number. Unable to LM.  Thanks,  -Ayson Cherubini

## 2017-08-10 NOTE — Telephone Encounter (Signed)
Left detailed message on pt's vm. Okay per dpr.  

## 2017-08-13 ENCOUNTER — Ambulatory Visit: Payer: Medicare Other | Admitting: Podiatry

## 2017-08-13 ENCOUNTER — Encounter: Payer: Self-pay | Admitting: Podiatry

## 2017-08-13 VITALS — BP 162/70 | HR 72

## 2017-08-13 DIAGNOSIS — E1159 Type 2 diabetes mellitus with other circulatory complications: Secondary | ICD-10-CM | POA: Diagnosis not present

## 2017-08-13 DIAGNOSIS — B351 Tinea unguium: Secondary | ICD-10-CM

## 2017-08-13 DIAGNOSIS — M79675 Pain in left toe(s): Secondary | ICD-10-CM

## 2017-08-13 DIAGNOSIS — M79674 Pain in right toe(s): Secondary | ICD-10-CM

## 2017-08-13 NOTE — Progress Notes (Signed)
   Subjective:    Patient ID: Kathleen Tran, female    DOB: 1941/05/08, 77 y.o.   MRN: [id number]  HPIhis patient presents to the  office with chief complaint of long thick painful nails especially her big toes both feet.  She says the toes are painful walking and wearing her shoes.  She is unable to self treat.  Patient is diabetic on medicine by mouth.  Patient presents per referral from her medical doctor.  She presents to the office for evaluation and treatment.    Review of Systems  All other systems reviewed and are negative.      Objective:   Physical Exam General Appearance  Alert, conversant and in no acute stress.  Vascular  Dorsalis pedis and posterior pulses are not  palpable  bilaterally.  Capillary return is within normal limits  bilaterally. Cold feet noted   Bilaterally.  Neurologic  Senn-Weinstein monofilament wire test within normal limits  bilaterally. Muscle power within normal limits bilaterally.  Nails Thick disfigured discolored nails with subungual debris bilaterally from hallux to fifth toes bilaterally. No evidence of bacterial infection or drainage bilaterally.  Orthopedic  No limitations of motion of motion feet bilaterally.  No crepitus or effusions noted.  No bony pathology or digital deformities noted.  Skin  normotropic skin with no porokeratosis noted bilaterally.  No signs of infections or ulcers noted.          Assessment & Plan:  Onychomycosis  B/L  Diabetes with vascular disease  IE  Debride nails.  RTC 3 months   Gardiner Barefoot DPM

## 2017-08-14 ENCOUNTER — Ambulatory Visit: Payer: Medicare Other

## 2017-08-14 DIAGNOSIS — M6281 Muscle weakness (generalized): Secondary | ICD-10-CM

## 2017-08-14 DIAGNOSIS — M79605 Pain in left leg: Secondary | ICD-10-CM

## 2017-08-14 DIAGNOSIS — M5442 Lumbago with sciatica, left side: Principal | ICD-10-CM

## 2017-08-14 DIAGNOSIS — G8929 Other chronic pain: Secondary | ICD-10-CM

## 2017-08-14 NOTE — Therapy (Cosign Needed)
[facility] [address] [place], [address] Phone: [phone]   Fax:  [phone]  Physical Therapy Treatment  Patient Details  Name: Kathleen Tran MRN: [id number] Date of Birth: 1941-04-01 Referring Provider: [name]   Encounter Date: 08/14/2017  PT End of Session - 08/14/17 1612    Visit Number  4    Number of Visits  13    Date for PT Re-Evaluation  09/12/17    PT Start Time  8315    PT Stop Time  1600    PT Time Calculation (min)  45 min    Activity Tolerance  Patient tolerated treatment well    Behavior During Therapy  [facility] for tasks assessed/performed       Past Medical History:  Diagnosis Date  . Arthritis   . GERD (gastroesophageal reflux disease)   . Hyperlipidemia     Past Surgical History:  Procedure Laterality Date  . APPENDECTOMY    . PARTIAL HIP ARTHROPLASTY Right   . TONSILLECTOMY      There were no vitals filed for this visit.  Subjective Assessment - 08/14/17 1611    Subjective  Pt reports she almost fell earlier today because L LE "gave way." Pt rates L LE pain as 5/10.    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, L5-S1    Patient Stated Goals  Improve heavy household chores    Pain Score  5     Pain Location  Leg    Pain Orientation  Left    Pain Descriptors / Indicators  Aching    Pain Type  Chronic pain    Pain Onset  More than a month ago    Pain Frequency  Constant         Treatment   Therapeutic Exercise:  Manually resisted dorsiflexion of L ankle - 1x12 to decrease pain RTB dorsiflexion of L ankle - 2x20 to decrease pain and increase strength of the L dorsiflexors Standing hip abduction - 2x20 to increase strength and endurance of the hip abductors Glute bridges - 2x20 to increase endurance and activation of the glute muscles Deadbugs -2x10  B to increase strength of the core musculature RTB seated "woodchopper" 1x20 B to increase core strength and endurance Standing hip extension - 2x20 B to increase endurance and activation of the glutes  Patient demonstrates decreased L LE pain at end of session.   PT Education - 08/14/17 1612    Education provided  Yes    Education Details  HEP: seated RTB dorsiflexion    Person(s) Educated  Patient    Methods  Explanation;Demonstration    Comprehension  Verbalized understanding;Returned demonstration          PT Long Term Goals - 08/01/17 1647      PT LONG TERM GOAL #1   Title  Patient will be independent with HEP to continue the benefits of therapy after discharge.    Baseline  Patient requires cueing to perform HEP    Time  6    Period  Weeks    Status  New    Target Date  09/12/17      PT LONG TERM GOAL #2   Title  Patient's worst LBP and L LE pain will decrease from 10/10 to 5/10 in six weeks for patient to return  to ADLs without increased pain.    Baseline  Patient's worst pain is currently 10/10.    Time  6    Period  Weeks    Status  New    Target Date  09/12/17      PT LONG TERM GOAL #3   Title  Patient's Modified Oswestry Low Back Pain score will decrease from 48% to 30% so patient can stand for more than 10 minutes without increased pain.    Baseline  Patient currently cannot stand for more than 10 minutes without increased pain    Time  6    Period  Weeks    Target Date  09/12/17      PT LONG TERM GOAL #4   Title  Patient will increase hip flexor strength bilaterally from 4/5 to 5/5 in order for patient to lift her legs to get in her car.    Baseline  Patient cannot currently lift her legs to get in her car without assistance.    Time  6    Period  Weeks    Status  New    Target Date  09/12/17            Plan - 08/14/17 1613    Clinical Impression Statement  Pt reports overall improvement with LBP, but reports continued increased L anterior LE  pain with ambulation. Pt performed manually resisted dorsiflexion and afterward pt demonstrated decreased L LE pain and was able to ambulate without increased pain, indicating decreased spasm of the L tibialis anterior.  Although patient demonstrates improvement, pt continues to demonstrate increased fatigue with core and hip exercies, indicating weakness of the core and hip musculature. Patient will benefit from further skilled therapy to return to prior levels of function.    Rehab Potential  Fair    Clinical Impairments Affecting Rehab Potential  + pt is motivated to do well in therapy, pt has had success with HEP prescribed by orthopedist; - multiple comorbidities    PT Frequency  2x / week    PT Duration  6 weeks    PT Treatment/Interventions  ADLs/Self Care Home Management;Cryotherapy;Electrical Stimulation;Moist Heat;Iontophoresis 4mg /ml Dexamethasone;Ultrasound;Traction;Parrafin;Contrast Bath;Gait training;Stair training;Functional mobility training;Therapeutic activities;Therapeutic exercise;Balance training;Neuromuscular re-education;Patient/family education;Manual techniques;Compression bandaging;Scar mobilization;Passive range of motion;Dry needling;Energy conservation;Splinting;Taping    PT Next Visit Plan  manual therapy, strength and endruance training of hip and LEs    PT Home Exercise Plan  see education    Consulted and Agree with Plan of Care  Patient       Patient will benefit from skilled therapeutic intervention in order to improve the following deficits and impairments:  Abnormal gait, Decreased coordination, Decreased range of motion, Difficulty walking, Increased fascial restricitons, Decreased endurance, Increased muscle spasms, Obesity, Decreased activity tolerance, Pain, Decreased balance, Hypomobility, Impaired flexibility, Improper body mechanics, Decreased mobility, Decreased strength, Postural dysfunction  Visit Diagnosis: Chronic bilateral low back pain with left-sided  sciatica  Pain in left leg  Muscle weakness (generalized)     Problem List Patient Active Problem List   Diagnosis Date Noted  . Arthritis 04/12/2015  . Type 2 diabetes mellitus without complication, without long-term current use of insulin ([place]) 04/12/2015  . Osteoporosis 04/12/2015  . Hypercholesterolemia 04/12/2015  . Essential hypertension 04/12/2015    Ricard Dillon, SPT 08/14/2017, 5:15 PM  [facility] [address] [place], [address] Phone: [phone]   Fax:  [phone]  Name: Charisma Charlot MRN: [id number] Date of  Birth: 13-Dec-1940

## 2017-08-15 ENCOUNTER — Ambulatory Visit: Payer: Medicare Other

## 2017-08-15 DIAGNOSIS — M79605 Pain in left leg: Secondary | ICD-10-CM

## 2017-08-15 DIAGNOSIS — M5442 Lumbago with sciatica, left side: Principal | ICD-10-CM

## 2017-08-15 DIAGNOSIS — G8929 Other chronic pain: Secondary | ICD-10-CM

## 2017-08-15 DIAGNOSIS — M6281 Muscle weakness (generalized): Secondary | ICD-10-CM

## 2017-08-15 NOTE — Therapy (Signed)
[facility] [address], [place], [address] Phone: [phone]   Fax:  [phone]  Physical Therapy Treatment  Patient Details  Name: Kathleen Tran MRN: [id number] Date of Birth: 1940/09/19 Referring Provider: [name]   Encounter Date: 08/15/2017  PT End of Session - 08/15/17 1629    Visit Number  5    Number of Visits  13    Date for PT Re-Evaluation  09/12/17    PT Start Time  6144    PT Stop Time  1515    PT Time Calculation (min)  43 min    Activity Tolerance  Patient tolerated treatment well    Behavior During Therapy  [facility] for tasks assessed/performed       Past Medical History:  Diagnosis Date  . Arthritis   . GERD (gastroesophageal reflux disease)   . Hyperlipidemia     Past Surgical History:  Procedure Laterality Date  . APPENDECTOMY    . PARTIAL HIP ARTHROPLASTY Right   . TONSILLECTOMY      There were no vitals filed for this visit.  Subjective Assessment - 08/15/17 1430    Subjective  Pt reports L lower leg has been hurting since this morning and when she walking. Pt reports pain prevents her from standing up from couch sometimes.  Patient reports overall pain is not as bad as it used to be and says pain is currently 0-1/10 today.    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, L5-S1    Patient Stated Goals  Improve heavy household chores    Pain Score  1     Pain Onset  More than a month ago        Treatment   Therapeutic Exercise:  "Butterfly" Glute bridge - 2x20 to increase activation and endurance of the glutes   Single leg glute bridge - 1x15 B to increase strength of the glutes bilaterally  Dead bugs  -- 2x17 B to increase strength and endurance of the core musculature  Sit to stands 1x20 - to increase LE endurance   Sit to stands with 3 kg ball and  tandem stance - 1x20 B to increase LE strength and endurance  Standing Hip abduction with YTB around ankles- 2x20 to increase strength and endurance of the hip abductors  Standing hip extension with YTB - 2x20 B to increase strength and endurance of the hip extensors  Patient demonstrates increased fatigue with exercise at end of session.   PT Education - 08/15/17 1628    Education provided  Yes    Education Details  Form/technique with exercise    Person(s) Educated  Patient    Methods  Explanation;Demonstration    Comprehension  Verbalized understanding;Returned demonstration          PT Long Term Goals - 08/01/17 1647      PT LONG TERM GOAL #1   Title  Patient will be independent with HEP to continue the benefits of therapy after discharge.    Baseline  Patient requires cueing to perform HEP    Time  6    Period  Weeks    Status  New    Target Date  09/12/17      PT LONG TERM GOAL #2   Title  Patient's worst LBP and L LE pain will decrease from  10/10 to 5/10 in six weeks for patient to return to ADLs without increased pain.    Baseline  Patient's worst pain is currently 10/10.    Time  6    Period  Weeks    Status  New    Target Date  09/12/17      PT LONG TERM GOAL #3   Title  Patient's Modified Oswestry Low Back Pain score will decrease from 48% to 30% so patient can stand for more than 10 minutes without increased pain.    Baseline  Patient currently cannot stand for more than 10 minutes without increased pain    Time  6    Period  Weeks    Target Date  09/12/17      PT LONG TERM GOAL #4   Title  Patient will increase hip flexor strength bilaterally from 4/5 to 5/5 in order for patient to lift her legs to get in her car.    Baseline  Patient cannot currently lift her legs to get in her car without assistance.    Time  6    Period  Weeks    Status  New    Target Date  09/12/17            Plan - 08/15/17 1630    Clinical Impression Statement  Patient  demonstrates improvement with L LE pain compared to previous session. Although patient demonstrates improvement, patient continues to demonstrate increased fatigue with deadbug core exercise, indicating decreased strength of the core musculature. Pt will continue to benefit from further skilled therapy to decrease pain and increase core strength.     Rehab Potential  Fair    Clinical Impairments Affecting Rehab Potential  + pt is motivated to do well in therapy, pt has had success with HEP prescribed by orthopedist; - multiple comorbidities    PT Frequency  2x / week    PT Duration  6 weeks    PT Treatment/Interventions  ADLs/Self Care Home Management;Cryotherapy;Electrical Stimulation;Moist Heat;Iontophoresis 4mg /ml Dexamethasone;Ultrasound;Traction;Parrafin;Contrast Bath;Gait training;Stair training;Functional mobility training;Therapeutic activities;Therapeutic exercise;Balance training;Neuromuscular re-education;Patient/family education;Manual techniques;Compression bandaging;Scar mobilization;Passive range of motion;Dry needling;Energy conservation;Splinting;Taping    PT Next Visit Plan  manual therapy, strength and endruance training of hip and LEs    PT Home Exercise Plan  see education    Consulted and Agree with Plan of Care  Patient       Patient will benefit from skilled therapeutic intervention in order to improve the following deficits and impairments:  Abnormal gait, Decreased coordination, Decreased range of motion, Difficulty walking, Increased fascial restricitons, Decreased endurance, Increased muscle spasms, Obesity, Decreased activity tolerance, Pain, Decreased balance, Hypomobility, Impaired flexibility, Improper body mechanics, Decreased mobility, Decreased strength, Postural dysfunction  Visit Diagnosis: Chronic bilateral low back pain with left-sided sciatica  Pain in left leg  Muscle weakness (generalized)     Problem List Patient Active Problem List   Diagnosis  Date Noted  . Arthritis 04/12/2015  . Type 2 diabetes mellitus without complication, without long-term current use of insulin ([place]) 04/12/2015  . Osteoporosis 04/12/2015  . Hypercholesterolemia 04/12/2015  . Essential hypertension 04/12/2015    Ricard Dillon, SPT 08/15/2017, 4:33 PM  [facility] [address], [place], [address] Phone: [phone]   Fax:  [phone]  Name: Kathleen Tran MRN: [id number] Date of Birth: 1940-10-21

## 2017-08-20 ENCOUNTER — Telehealth: Payer: Self-pay | Admitting: Physician Assistant

## 2017-08-20 DIAGNOSIS — R195 Other fecal abnormalities: Secondary | ICD-10-CM

## 2017-08-20 NOTE — Telephone Encounter (Signed)
Patient notified of positive cologuard and colonoscopy ordered.

## 2017-08-21 ENCOUNTER — Telehealth: Payer: Self-pay

## 2017-08-21 NOTE — Telephone Encounter (Signed)
Patient called office stating that she was returning your call. KW

## 2017-08-22 ENCOUNTER — Ambulatory Visit: Payer: Medicare Other

## 2017-08-28 ENCOUNTER — Ambulatory Visit: Payer: Medicare Other

## 2017-08-28 DIAGNOSIS — G8929 Other chronic pain: Secondary | ICD-10-CM

## 2017-08-28 DIAGNOSIS — M79605 Pain in left leg: Secondary | ICD-10-CM

## 2017-08-28 DIAGNOSIS — M5442 Lumbago with sciatica, left side: Secondary | ICD-10-CM | POA: Diagnosis not present

## 2017-08-28 DIAGNOSIS — M6281 Muscle weakness (generalized): Secondary | ICD-10-CM

## 2017-08-28 NOTE — Therapy (Signed)
[facility] [address], [place], [address] Phone: [phone]   Fax:  [phone]  Physical Therapy Treatment  Patient Details  Name: Kathleen Tran MRN: [id number] Date of Birth: [birth year] Referring Provider: [name]   Encounter Date: 08/28/2017  PT End of Session - 08/28/17 1527    Visit Number  6    Number of Visits  13    Date for PT Re-Evaluation  09/12/17    PT Start Time  1740    PT Stop Time  1517    PT Time Calculation (min)  42 min    Activity Tolerance  Patient tolerated treatment well    Behavior During Therapy  [facility] for tasks assessed/performed       Past Medical History:  Diagnosis Date  . Arthritis   . GERD (gastroesophageal reflux disease)   . Hyperlipidemia     Past Surgical History:  Procedure Laterality Date  . APPENDECTOMY    . PARTIAL HIP ARTHROPLASTY Right   . TONSILLECTOMY      There were no vitals filed for this visit.  Subjective Assessment - 08/28/17 1524    Subjective  Pt reports standing from a seated positionstill aggravates L knee. Pt reports no knee pain currently.  Pt states back feels "pretty good" today. Pt reports that back hurts when she first gets up in the morning, but that pain improves with activity. Pt reports doing HEP.    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  No/denies    Pain Onset  More than a month ago        Treatment   Therapeutic Exercise:   Standing glute squeezes - 1x20 to increase activation of the glutes  Standing Hip Extension - 1x20 B to increase endurance of the hip extensors  Standing hip extension with YTB - 1x20  Antirotation RTB lateral walks- 2x12  To increase core strength  Manually resisted knee extension 1x10 to  decrease knee pain   Manually resisted knee flexion 1x10 to decrease hamstring pain  Sit to Stands  --  1x10 5# to increase LE strength  Sit to stands tandem stance -- 1x12 5# B to increase LE strength  Patient demonstrates increased fatigue with exercise at end of session.      PT Education - 08/28/17 1527    Education provided  Yes    Education Details  Form/technique with exercise    Person(s) Educated  Patient    Methods  Explanation;Demonstration    Comprehension  Verbalized understanding;Returned demonstration          PT Long Term Goals - 08/01/17 1647      PT LONG TERM GOAL #1   Title  Patient will be independent with HEP to continue the benefits of therapy after discharge.    Baseline  Patient requires cueing to perform HEP    Time  6    Period  Weeks    Status  New    Target Date  09/12/17      PT LONG TERM GOAL #2   Title  Patient's worst LBP and L LE pain will decrease from 10/10 to 5/10 in six weeks for patient to return to ADLs without increased pain.  Baseline  Patient's worst pain is currently 10/10.    Time  6    Period  Weeks    Status  New    Target Date  09/12/17      PT LONG TERM GOAL #3   Title  Patient's Modified Oswestry Low Back Pain score will decrease from 48% to 30% so patient can stand for more than 10 minutes without increased pain.    Baseline  Patient currently cannot stand for more than 10 minutes without increased pain    Time  6    Period  Weeks    Target Date  09/12/17      PT LONG TERM GOAL #4   Title  Patient will increase hip flexor strength bilaterally from 4/5 to 5/5 in order for patient to lift her legs to get in her car.    Baseline  Patient cannot currently lift her legs to get in her car without assistance.    Time  6    Period  Weeks    Status  New    Target Date  09/12/17            Plan - 08/28/17 1528    Clinical Impression Statement  Patient demonstrates continued improvement with back and L LE pain  compared ot previous session, indicating functional carryover between sessions.  Although pt demonstrates improvement, pt demonstrates increased fatigue with STS exercises, indicating decreased strength and endurance of the LEs. Pt will benefit from further skilled therapy to increase strength and endurance of the LEs.     Rehab Potential  Fair    Clinical Impairments Affecting Rehab Potential  + pt is motivated to do well in therapy, pt has had success with HEP prescribed by orthopedist; - multiple comorbidities    PT Frequency  2x / week    PT Duration  6 weeks    PT Treatment/Interventions  ADLs/Self Care Home Management;Cryotherapy;Electrical Stimulation;Moist Heat;Iontophoresis 4mg /ml Dexamethasone;Ultrasound;Traction;Parrafin;Contrast Bath;Gait training;Stair training;Functional mobility training;Therapeutic activities;Therapeutic exercise;Balance training;Neuromuscular re-education;Patient/family education;Manual techniques;Compression bandaging;Scar mobilization;Passive range of motion;Dry needling;Energy conservation;Splinting;Taping    PT Next Visit Plan  manual therapy, strength and endruance training of hip and LEs    PT Home Exercise Plan  see education    Consulted and Agree with Plan of Care  Patient       Patient will benefit from skilled therapeutic intervention in order to improve the following deficits and impairments:  Abnormal gait, Decreased coordination, Decreased range of motion, Difficulty walking, Increased fascial restricitons, Decreased endurance, Increased muscle spasms, Obesity, Decreased activity tolerance, Pain, Decreased balance, Hypomobility, Impaired flexibility, Improper body mechanics, Decreased mobility, Decreased strength, Postural dysfunction  Visit Diagnosis: Chronic bilateral low back pain with left-sided sciatica  Pain in left leg  Muscle weakness (generalized)     Problem List Patient Active Problem List   Diagnosis Date Noted  . Arthritis  04/12/2015  . Type 2 diabetes mellitus without complication, without long-term current use of insulin ([place]) 04/12/2015  . Osteoporosis 04/12/2015  . Hypercholesterolemia 04/12/2015  . Essential hypertension 04/12/2015    Ricard Dillon, SPT 08/28/2017, 3:33 PM  [facility] [address] [place], [address] Phone: [phone]   Fax:  [phone]  Name: Kathleen Tran MRN: [id number] Date of Birth: 1940-10-16

## 2017-08-30 ENCOUNTER — Ambulatory Visit: Payer: Medicare Other

## 2017-09-04 ENCOUNTER — Ambulatory Visit: Payer: Medicare Other | Attending: Physician Assistant

## 2017-09-04 DIAGNOSIS — G8929 Other chronic pain: Secondary | ICD-10-CM | POA: Diagnosis present

## 2017-09-04 DIAGNOSIS — M5442 Lumbago with sciatica, left side: Secondary | ICD-10-CM | POA: Insufficient documentation

## 2017-09-04 DIAGNOSIS — M6281 Muscle weakness (generalized): Secondary | ICD-10-CM | POA: Insufficient documentation

## 2017-09-04 DIAGNOSIS — M79605 Pain in left leg: Secondary | ICD-10-CM | POA: Diagnosis present

## 2017-09-04 NOTE — Therapy (Signed)
[facility] [address], [place], [address] Phone: [phone]   Fax:  [phone]  Physical Therapy Treatment  Patient Details  Name: Kathleen Tran MRN: [id number] Date of Birth: 09-17-1940 Referring Provider: [name]   Encounter Date: 09/04/2017  PT End of Session - 09/04/17 1310    Visit Number  7    Number of Visits  13    Date for PT Re-Evaluation  09/12/17    PT Start Time  1300    PT Stop Time  1345    PT Time Calculation (min)  45 min    Activity Tolerance  Patient tolerated treatment well    Behavior During Therapy  [facility] for tasks assessed/performed       Past Medical History:  Diagnosis Date  . Arthritis   . GERD (gastroesophageal reflux disease)   . Hyperlipidemia     Past Surgical History:  Procedure Laterality Date  . APPENDECTOMY    . PARTIAL HIP ARTHROPLASTY Right   . TONSILLECTOMY      There were no vitals filed for this visit.  Subjective Assessment - 09/04/17 1308    Subjective  Patient reports she has increased pain in her knee when sitting on certain surfaces. Patient states her back is feeling okay today.    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  No/denies    Pain Onset  More than a month ago       TREATMENT Manual ischemic compression performed to B distal attachments of the hip flexors to decrease increased pain with raising leg her leg - patient reports decreased pain after performance  Therapeutic Exercise SLRs in semi hooklying - x 12 stopped secondary to fatigue B Bridges with cueing to perform on one foot - x 12 B Bridges with band around knees - x15  CKC lumbar ext in standing - x 20 Hip extension in standing - x 20 B  Hip abduction/extension in standing - 2  x 20  Sit to stands with airex under feet with lumbar extension at ER - 2 x 15  Increased fatigue at the end of the session.    PT Education - 09/04/17 1309    Education provided  Yes    Education Details  form/technique with exercise    Person(s) Educated  Patient    Methods  Explanation;Demonstration    Comprehension  Verbalized understanding;Returned demonstration          PT Long Term Goals - 08/01/17 1647      PT LONG TERM GOAL #1   Title  Patient will be independent with HEP to continue the benefits of therapy after discharge.    Baseline  Patient requires cueing to perform HEP    Time  6    Period  Weeks    Status  New    Target Date  09/12/17      PT LONG TERM GOAL #2   Title  Patient's worst LBP and L LE pain will decrease from 10/10 to 5/10 in six weeks for patient to return to ADLs without increased pain.    Baseline  Patient's worst pain is currently 10/10.    Time  6    Period  Weeks  Status  New    Target Date  09/12/17      PT LONG TERM GOAL #3   Title  Patient's Modified Oswestry Low Back Pain score will decrease from 48% to 30% so patient can stand for more than 10 minutes without increased pain.    Baseline  Patient currently cannot stand for more than 10 minutes without increased pain    Time  6    Period  Weeks    Target Date  09/12/17      PT LONG TERM GOAL #4   Title  Patient will increase hip flexor strength bilaterally from 4/5 to 5/5 in order for patient to lift her legs to get in her car.    Baseline  Patient cannot currently lift her legs to get in her car without assistance.    Time  6    Period  Weeks    Status  New    Target Date  09/12/17            Plan - 09/04/17 1344    Clinical Impression Statement  Patient demonstrates increased fatigue along the hips after performing standing hip abduction indicating poor muscular endurance. Patient also demonstrates difficulty with performing pelvic/lumbar coordination in sitting and  standing. Patient will benefit from further skilled therapy to return to prior level of function.     Rehab Potential  Fair    Clinical Impairments Affecting Rehab Potential  + pt is motivated to do well in therapy, pt has had success with HEP prescribed by orthopedist; - multiple comorbidities    PT Frequency  2x / week    PT Duration  6 weeks    PT Treatment/Interventions  ADLs/Self Care Home Management;Cryotherapy;Electrical Stimulation;Moist Heat;Iontophoresis 4mg /ml Dexamethasone;Ultrasound;Traction;Parrafin;Contrast Bath;Gait training;Stair training;Functional mobility training;Therapeutic activities;Therapeutic exercise;Balance training;Neuromuscular re-education;Patient/family education;Manual techniques;Compression bandaging;Scar mobilization;Passive range of motion;Dry needling;Energy conservation;Splinting;Taping    PT Next Visit Plan  manual therapy, strength and endruance training of hip and LEs    PT Home Exercise Plan  see education    Consulted and Agree with Plan of Care  Patient       Patient will benefit from skilled therapeutic intervention in order to improve the following deficits and impairments:  Abnormal gait, Decreased coordination, Decreased range of motion, Difficulty walking, Increased fascial restricitons, Decreased endurance, Increased muscle spasms, Obesity, Decreased activity tolerance, Pain, Decreased balance, Hypomobility, Impaired flexibility, Improper body mechanics, Decreased mobility, Decreased strength, Postural dysfunction  Visit Diagnosis: Chronic bilateral low back pain with left-sided sciatica  Pain in left leg  Muscle weakness (generalized)     Problem List Patient Active Problem List   Diagnosis Date Noted  . Arthritis 04/12/2015  . Type 2 diabetes mellitus without complication, without long-term current use of insulin ([place]) 04/12/2015  . Osteoporosis 04/12/2015  . Hypercholesterolemia 04/12/2015  . Essential hypertension 04/12/2015     Blythe Stanford, PT DPT 09/04/2017, 1:57 PM  [facility] [address] [place], [address] Phone: [phone]   Fax:  [phone]  Name: Kathleen Tran MRN: [id number] Date of Birth: [birth year]

## 2017-09-05 ENCOUNTER — Other Ambulatory Visit: Payer: Self-pay

## 2017-09-05 ENCOUNTER — Ambulatory Visit: Payer: Medicare Other | Admitting: Gastroenterology

## 2017-09-05 ENCOUNTER — Encounter: Payer: Self-pay | Admitting: Gastroenterology

## 2017-09-05 VITALS — BP 150/77 | HR 85 | Temp 98.1°F | Ht 61.0 in | Wt 194.0 lb

## 2017-09-05 DIAGNOSIS — R195 Other fecal abnormalities: Secondary | ICD-10-CM | POA: Diagnosis not present

## 2017-09-05 MED ORDER — PEG 3350-KCL-NA BICARB-NACL 420 G PO SOLR: 4000.0000 mL | Freq: Once | ORAL | 0 refills | Status: DC

## 2017-09-05 MED ORDER — BISACODYL 5 MG PO TBEC: 10.0000 mg | DELAYED_RELEASE_TABLET | Freq: Once | ORAL | 0 refills | Status: AC

## 2017-09-05 MED ORDER — PEG 3350-KCL-NA BICARB-NACL 420 G PO SOLR: 4000.0000 mL | Freq: Once | ORAL | 0 refills | Status: AC

## 2017-09-05 NOTE — Progress Notes (Signed)
Kathleen Tran [address]  [place], [place] [address]  Main: [phone]  Fax: [phone]   Gastroenterology Consultation  Referring Provider:     Florian Buff* Primary Care Physician:  Mar Daring, PA-C Primary Gastroenterologist:  Dr. [name] Reason for Consultation:     Positive Cologuard        HPI:   Kathleen Tran is a 77 y.o. y/o female referred for consultation & management  by Dr. [name], Clearnce Sorrel, PA-C.  Patient was seen by his primary care for annual physical exam on July 13, 2017.  Colo guard was ordered as part of routine colon cancer screening.  No previous history of colonoscopy.  Colo guard is positive and patient is referred to [place] for evaluation for colonoscopy.  Patient denies any family history of colon cancer.  No blood in stool.  No weight loss.  No appetite loss.  No nausea vomiting.  No heartburn.  No alarm symptoms.  No abdominal pain.  Denies any altered bowel habits. No previous colonoscopies.   Past Medical History:  Diagnosis Date  . Arthritis   . GERD (gastroesophageal reflux disease)   . Hyperlipidemia     Past Surgical History:  Procedure Laterality Date  . APPENDECTOMY    . PARTIAL HIP ARTHROPLASTY Right   . TONSILLECTOMY      Prior to Admission medications   Medication Sig Start Date End Date Taking? Authorizing Provider  aspirin 81 MG tablet Take 81 mg by mouth daily.    [provider]  Blood Glucose Monitoring Suppl (ONETOUCH VERIO IQ SYSTEM) W/DEVICE KIT Check blood sugar a.c.h.s. 04/19/15   Mar Daring, PA-C  Calcium Carbonate-Vitamin D 600-125 MG-UNIT TABS Take by mouth daily.    [provider]  glipiZIDE (GLUCOTROL) 5 MG tablet TAKE 1/2 TABLET BY MOUTH EVERY DAY BEFORE SUPPER 03/07/17   Mar Daring, PA-C  glucose blood (ONETOUCH VERIO) test strip To check blood sugar once daily 03/07/17   Mar Daring, PA-C  losartan (COZAAR) 50 MG  tablet TAKE 1 TABLET(50 MG) BY MOUTH DAILY 12/13/16   Mar Daring, PA-C  meloxicam (MOBIC) 15 MG tablet TK 1 T PO QD 04/26/17   [provider]  metFORMIN (GLUCOPHAGE) 1000 MG tablet TAKE 1 TABLET(1000 MG) BY MOUTH TWICE DAILY WITH A MEAL 06/13/17   Jerrol Banana., MD  Multiple Vitamin (MULTI VITAMIN DAILY PO) Take 1 tablet by mouth daily.    [provider]  Omega-3 Fatty Acids (FISH OIL) 1200 MG CAPS Take 1 capsule by mouth 2 (two) times daily.    [provider]  [facility] DELICA LANCETS FINE MISC To check blood sugar once to twice daily 05/15/17   Mar Daring, PA-C  pravastatin (PRAVACHOL) 80 MG tablet TAKE 1 TABLET(80 MG) BY MOUTH DAILY 12/13/16   Burnette, Clearnce Sorrel, PA-C  UNABLE TO FIND OneTouch Delica Lancets 30 gauge    [provider]    No family history on file.   Social History   Tobacco Use  . Smoking status: Never Smoker  . Smokeless tobacco: Never Used  Substance Use Topics  . Alcohol use: No    Frequency: Never  . Drug use: No    Allergies as of 09/05/2017  . (No Known Allergies)    Review of Systems:    All systems reviewed and negative except where noted in HPI.   Physical Exam:  There were no vitals taken for  this visit. No LMP recorded. Patient is postmenopausal. Psych:  Alert and cooperative. Normal mood and affect. General:   Alert,  Well-developed, well-nourished, pleasant and cooperative in NAD Head:  Normocephalic and atraumatic. Eyes:  Sclera clear, no icterus.   Conjunctiva pink. Ears:  Normal auditory acuity. Nose:  No deformity, discharge, or lesions. Mouth:  No deformity or lesions,oropharynx pink & moist. Neck:  Supple; no masses or thyromegaly. Lungs:  Respirations even and unlabored.  Clear throughout to auscultation.   No wheezes, crackles, or rhonchi. No acute distress. Heart:  Regular rate and rhythm; no murmurs, clicks, rubs, or gallops. Abdomen:  Normal bowel sounds.  No  bruits.  Soft, non-tender and non-distended without masses, hepatosplenomegaly or hernias noted.  No guarding or rebound tenderness.    Msk:  Symmetrical without gross deformities. Good, equal movement & strength bilaterally. Pulses:  Normal pulses noted. Extremities:  No clubbing or edema.  No cyanosis. Neurologic:  Alert and oriented x3;  grossly normal neurologically. Skin:  Intact without significant lesions or rashes. No jaundice. Lymph Nodes:  No significant cervical adenopathy. Psych:  Alert and cooperative. Normal mood and affect.   Labs: CBC    Component Value Date/Time   WBC 6.1 07/13/2017 1031   RBC 4.31 07/13/2017 1031   HGB 13.4 07/13/2017 1031   HCT 40.5 07/13/2017 1031   PLT 331 07/13/2017 1031   MCV 94 07/13/2017 1031   MCH 31.1 07/13/2017 1031   MCHC 33.1 07/13/2017 1031   RDW 14.4 07/13/2017 1031   LYMPHSABS 1.5 07/13/2017 1031   EOSABS 0.3 07/13/2017 1031   BASOSABS 0.0 07/13/2017 1031   CMP     Component Value Date/Time   NA 145 (H) 08/08/2017 1346   K 4.8 08/08/2017 1346   CL 105 08/08/2017 1346   CO2 24 08/08/2017 1346   GLUCOSE 116 (H) 08/08/2017 1346   BUN 22 08/08/2017 1346   CREATININE 0.67 08/08/2017 1346   CALCIUM 10.3 08/08/2017 1346   PROT 6.9 07/13/2017 1031   ALBUMIN 4.2 07/13/2017 1031   AST 19 07/13/2017 1031   ALT 17 07/13/2017 1031   ALKPHOS 65 07/13/2017 1031   BILITOT 0.3 07/13/2017 1031   GFRNONAA 87 08/08/2017 1346   GFRAA 100 08/08/2017 1346    Imaging Studies: No results found.  Assessment and Plan:   Kathleen Tran is a 77 y.o. y/o female has been referred for positive Colo guard  No previous history of colonoscopy No family history of colon cancer Positive Colo guard indicates colonoscopy for evaluation of polyps or cancers Patient is agreeable to scheduling this at this time. I have discussed alternative options, risks & benefits,  which include, but are not limited to, bleeding, infection, perforation,respiratory  complication & drug reaction.  The patient agrees with this plan & written consent will be obtained.    Patient was informed, that she should contact her insurance company, to find out what her co-pay or deductible would be, as she had a positive Colo guard, since this test would be a diagnostic study and not a screening exam.  This is, for her own benefit, so she does not have a surprise bill after the procedure.  She verbalized understanding.  Dr Kathleen Tran

## 2017-09-06 ENCOUNTER — Ambulatory Visit: Payer: Medicare Other

## 2017-09-11 ENCOUNTER — Ambulatory Visit: Payer: Medicare Other

## 2017-09-13 ENCOUNTER — Ambulatory Visit: Payer: Medicare Other

## 2017-09-13 DIAGNOSIS — G8929 Other chronic pain: Secondary | ICD-10-CM

## 2017-09-13 DIAGNOSIS — M6281 Muscle weakness (generalized): Secondary | ICD-10-CM

## 2017-09-13 DIAGNOSIS — M5442 Lumbago with sciatica, left side: Principal | ICD-10-CM

## 2017-09-13 DIAGNOSIS — M79605 Pain in left leg: Secondary | ICD-10-CM

## 2017-09-13 NOTE — Therapy (Signed)
[facility] [address] [place], [address] Phone: [phone]   Fax:  [phone]  Physical Therapy Treatment  Patient Details  Name: Kathleen Tran MRN: [id number] Date of Birth: 1941-06-20 Referring Provider: [name]   Encounter Date: 09/13/2017  PT End of Session - 09/13/17 1507    Visit Number  8    Number of Visits  13    Date for PT Re-Evaluation  09/12/17    PT Start Time  6144    PT Stop Time  1500    PT Time Calculation (min)  45 min    Activity Tolerance  Patient tolerated treatment well    Behavior During Therapy  [facility] for tasks assessed/performed       Past Medical History:  Diagnosis Date  . Arthritis   . GERD (gastroesophageal reflux disease)   . Hyperlipidemia     Past Surgical History:  Procedure Laterality Date  . APPENDECTOMY    . PARTIAL HIP ARTHROPLASTY Right   . TONSILLECTOMY      There were no vitals filed for this visit.  Subjective Assessment - 09/13/17 1458    Subjective  Patient reports she is improving considerably but states she states increased pain of 8/10. Patient reports she is improving and would like to contrinue performing exercises at home at help.     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  No/denies    Pain Onset  More than a month ago       Treatment    Therapeutic Exercise:   Running man in standing with UE support - 2 x 15 B Leg Press - x 25 25#, x20 35# with focus on fast performance out and slow movement eccentrically Piloff press in standing - x 20 B 5# with OMEGA Hip abduction - 2 x 20 YTB  Hip hiking in standing off of 3" step - x20, x 10 Nustep to address improve quad and LE strength - 5 min     Pt demonstrates increased fatigue with  exercise at end of session.   PT Education - 09/13/17 1506    Education provided  Yes    Education Details  form/technique with exercise    Person(s) Educated  Patient    Methods  Explanation;Demonstration    Comprehension  Verbalized understanding;Returned demonstration          PT Long Term Goals - 09/13/17 1520      PT LONG TERM GOAL #1   Title  Patient will be independent with HEP to continue the benefits of therapy after discharge.    Baseline  Patient requires cueing to perform HEP    Time  6    Period  Weeks    Status  New      PT LONG TERM GOAL #2   Title  Patient's worst LBP and L LE pain will decrease from 10/10 to 5/10 in six weeks for patient to return to ADLs without increased pain.    Baseline  Patient's worst pain is currently 10/10.; 09/13/2017: 8/10    Time  6    Period  Weeks    Status  New      PT LONG TERM GOAL #3   Title  Patient's Modified Oswestry Low Back Pain score will decrease from 48% to 30% so patient can stand for more than 10 minutes without increased pain.    Baseline  Patient currently cannot stand for more than 10 minutes without increased pain; 30% MODI    Time  6    Period  Weeks    Status  Achieved      PT LONG TERM GOAL #4   Title  Patient will increase hip flexor strength bilaterally from 4/5 to 5/5 in order for patient to lift her legs to get in her car.    Baseline  Patient cannot currently lift her legs to get in her car without assistance.5/5 on hip flexion    Time  6    Period  Weeks    Status  Achieved            Plan - 09/13/17 1507    Clinical Impression Statement  Patient demonstrates improvement towards long term goals and has met all necessary goals for improvement in function. Patient reports she is able to perform greater amount of exercises without increase in pain. Patient states she is independent with exercise and would like to be discharged.     Rehab Potential  Fair    Clinical Impairments Affecting Rehab  Potential  + pt is motivated to do well in therapy, pt has had success with HEP prescribed by orthopedist; - multiple comorbidities    PT Frequency  2x / week    PT Duration  6 weeks    PT Treatment/Interventions  ADLs/Self Care Home Management;Cryotherapy;Electrical Stimulation;Moist Heat;Iontophoresis '4mg'$ /ml Dexamethasone;Ultrasound;Traction;Parrafin;Contrast Bath;Gait training;Stair training;Functional mobility training;Therapeutic activities;Therapeutic exercise;Balance training;Neuromuscular re-education;Patient/family education;Manual techniques;Compression bandaging;Scar mobilization;Passive range of motion;Dry needling;Energy conservation;Splinting;Taping    PT Next Visit Plan  manual therapy, strength and endruance training of hip and LEs    PT Home Exercise Plan  see education    Consulted and Agree with Plan of Care  Patient       Patient will benefit from skilled therapeutic intervention in order to improve the following deficits and impairments:  Abnormal gait, Decreased coordination, Decreased range of motion, Difficulty walking, Increased fascial restricitons, Decreased endurance, Increased muscle spasms, Obesity, Decreased activity tolerance, Pain, Decreased balance, Hypomobility, Impaired flexibility, Improper body mechanics, Decreased mobility, Decreased strength, Postural dysfunction  Visit Diagnosis: Chronic bilateral low back pain with left-sided sciatica  Pain in left leg  Muscle weakness (generalized)     Problem List Patient Active Problem List   Diagnosis Date Noted  . Arthritis 04/12/2015  . Type 2 diabetes mellitus without complication, without long-term current use of insulin ([place]) 04/12/2015  . Osteoporosis 04/12/2015  . Hypercholesterolemia 04/12/2015  . Essential hypertension 04/12/2015    Blythe Stanford, PT DPT 09/13/2017, 3:25 PM  [facility] [address], [place],  [address] Phone: [phone]   Fax:  [phone]  Name: Kathleen Tran MRN: [id number] Date of Birth: May 09, 1941

## 2017-09-13 NOTE — Addendum Note (Signed)
Addended by: Blain Pais on: 09/13/2017 03:39 PM   Modules accepted: Orders

## 2017-09-15 ENCOUNTER — Other Ambulatory Visit: Payer: Self-pay | Admitting: Family Medicine

## 2017-09-15 DIAGNOSIS — E119 Type 2 diabetes mellitus without complications: Secondary | ICD-10-CM

## 2017-09-17 NOTE — Telephone Encounter (Signed)
Kathleen Tran, I think this is your patient Thanks

## 2017-09-18 ENCOUNTER — Telehealth: Payer: Self-pay | Admitting: Gastroenterology

## 2017-09-18 ENCOUNTER — Other Ambulatory Visit: Payer: Self-pay

## 2017-09-18 DIAGNOSIS — R195 Other fecal abnormalities: Secondary | ICD-10-CM

## 2017-09-18 NOTE — Telephone Encounter (Signed)
PT LEFT VM STATING SHE IS SUPPOSED TO CALL COLONOSCOPY FOR TOMORROW SHE WANTS TO KNOW WHAT IS GOING ON

## 2017-09-18 NOTE — Telephone Encounter (Signed)
Rescheduled colonoscopy because patient had not prepped for procedure and she did not understand instructions.  Called her back to go over her colonoscopy instructions. Rescheduled her to 09/26/17

## 2017-09-25 ENCOUNTER — Encounter: Payer: Self-pay | Admitting: Emergency Medicine

## 2017-09-26 ENCOUNTER — Ambulatory Visit
Admission: RE | Admit: 2017-09-26 | Discharge: 2017-09-26 | Disposition: A | Discharge status: Home / Self Care | Payer: Medicare Other | Source: Ambulatory Visit | Attending: Gastroenterology | Admitting: Gastroenterology

## 2017-09-26 ENCOUNTER — Ambulatory Visit: Payer: Medicare Other | Admitting: Certified Registered Nurse Anesthetist

## 2017-09-26 ENCOUNTER — Encounter: Payer: Self-pay | Admitting: Emergency Medicine

## 2017-09-26 ENCOUNTER — Encounter
Admission: RE | Disposition: A | Discharge status: Home / Self Care | Payer: Self-pay | Source: Ambulatory Visit | Attending: Gastroenterology

## 2017-09-26 DIAGNOSIS — D122 Benign neoplasm of ascending colon: Secondary | ICD-10-CM | POA: Diagnosis not present

## 2017-09-26 DIAGNOSIS — Z7984 Long term (current) use of oral hypoglycemic drugs: Secondary | ICD-10-CM | POA: Insufficient documentation

## 2017-09-26 DIAGNOSIS — D120 Benign neoplasm of cecum: Secondary | ICD-10-CM | POA: Diagnosis not present

## 2017-09-26 DIAGNOSIS — R195 Other fecal abnormalities: Secondary | ICD-10-CM | POA: Diagnosis not present

## 2017-09-26 DIAGNOSIS — K219 Gastro-esophageal reflux disease without esophagitis: Secondary | ICD-10-CM | POA: Insufficient documentation

## 2017-09-26 DIAGNOSIS — E785 Hyperlipidemia, unspecified: Secondary | ICD-10-CM | POA: Insufficient documentation

## 2017-09-26 DIAGNOSIS — Z79899 Other long term (current) drug therapy: Secondary | ICD-10-CM | POA: Insufficient documentation

## 2017-09-26 DIAGNOSIS — K644 Residual hemorrhoidal skin tags: Secondary | ICD-10-CM | POA: Diagnosis not present

## 2017-09-26 DIAGNOSIS — D124 Benign neoplasm of descending colon: Secondary | ICD-10-CM | POA: Diagnosis not present

## 2017-09-26 DIAGNOSIS — E119 Type 2 diabetes mellitus without complications: Secondary | ICD-10-CM | POA: Insufficient documentation

## 2017-09-26 DIAGNOSIS — Z7982 Long term (current) use of aspirin: Secondary | ICD-10-CM | POA: Insufficient documentation

## 2017-09-26 HISTORY — DX: Type 2 diabetes mellitus without complications: E11.9

## 2017-09-26 HISTORY — PX: COLONOSCOPY WITH PROPOFOL: SHX5780

## 2017-09-26 LAB — GLUCOSE, CAPILLARY: GLUCOSE-CAPILLARY: 130 mg/dL — AB (ref 65–99)

## 2017-09-26 SURGERY — COLONOSCOPY WITH PROPOFOL
Anesthesia: General

## 2017-09-26 MED ORDER — PROPOFOL 500 MG/50ML IV EMUL
INTRAVENOUS | Status: AC
  Filled 2017-09-26: qty 50

## 2017-09-26 MED ORDER — PROPOFOL 500 MG/50ML IV EMUL
INTRAVENOUS | Status: DC | PRN
  Administered 2017-09-26: 150 ug/kg/min via INTRAVENOUS

## 2017-09-26 MED ORDER — SODIUM CHLORIDE 0.9 % IV SOLN
INTRAVENOUS | Status: DC
  Administered 2017-09-26: 1000 mL via INTRAVENOUS

## 2017-09-26 MED ORDER — PROPOFOL 10 MG/ML IV BOLUS
INTRAVENOUS | Status: DC | PRN
  Administered 2017-09-26: 60 mg via INTRAVENOUS

## 2017-09-26 NOTE — Anesthesia Postprocedure Evaluation (Signed)
Anesthesia Post Note  Patient: Kathleen Tran  Procedure(s) Performed: COLONOSCOPY WITH PROPOFOL (N/A )  Anesthesia Type: General Level of consciousness: awake     Last Vitals:  Vitals:   09/26/17 0832 09/26/17 1018  BP: (!) 155/74   Pulse: 81   Resp: 17   Temp: (!) 35.8 C (!) (P) 36.1 C  SpO2: 99%     Last Pain:  Vitals:   09/26/17 1018  TempSrc: (P) Tympanic                 Blima Singer

## 2017-09-26 NOTE — Anesthesia Post-op Follow-up Note (Signed)
Anesthesia QCDR form completed.        

## 2017-09-26 NOTE — Transfer of Care (Signed)
Immediate Anesthesia Transfer of Care Note  Patient: Kathleen Tran  Procedure(s) Performed: COLONOSCOPY WITH PROPOFOL (N/A )  Patient Location: PACU  Anesthesia Type:General  Level of Consciousness: sedated  Airway & Oxygen Therapy: Patient Spontanous Breathing and Patient connected to nasal cannula oxygen  Post-op Assessment: Report given to RN and Post -op Vital signs reviewed and stable  Post vital signs: Reviewed and stable  Last Vitals:  Vitals Value Taken Time  BP    Temp    Pulse    Resp    SpO2      Last Pain:  Vitals:   09/26/17 0832  TempSrc: Tympanic         Complications: No apparent anesthesia complications

## 2017-09-26 NOTE — Anesthesia Procedure Notes (Signed)
Performed by: Keily Lepp, CRNA Pre-anesthesia Checklist: Patient identified, Emergency Drugs available, Suction available, Patient being monitored and Timeout performed Patient Re-evaluated:Patient Re-evaluated prior to induction Oxygen Delivery Method: Nasal cannula Induction Type: IV induction       

## 2017-09-26 NOTE — H&P (Signed)
Vonda Antigua, MD [address] [place], [place], [place], [address] [address], [place], [place], [place], [address] Phone: [phone]  Fax: [phone]  Primary Care Physician:  Mar Daring, PA-C   Pre-Procedure History & Physical: HPI:  Kathleen Tran is a 77 y.o. female is here for a colonoscopy.   Past Medical History:  Diagnosis Date  . Arthritis   . Diabetes mellitus without complication ([place])   . GERD (gastroesophageal reflux disease)   . Hyperlipidemia     Past Surgical History:  Procedure Laterality Date  . APPENDECTOMY    . PARTIAL HIP ARTHROPLASTY Right   . TONSILLECTOMY      Prior to Admission medications   Medication Sig Start Date End Date Taking? Authorizing Provider  aspirin 81 MG tablet Take 81 mg by mouth daily.   Yes [provider]  Calcium Carbonate-Vitamin D 600-125 MG-UNIT TABS Take by mouth daily.   Yes [provider]  glipiZIDE (GLUCOTROL) 5 MG tablet TAKE 1/2 TABLET BY MOUTH EVERY DAY BEFORE SUPPER 03/07/17  Yes [name] M, PA-C  losartan (COZAAR) 50 MG tablet TAKE 1 TABLET(50 MG) BY MOUTH DAILY 12/13/16  Yes Mar Daring, PA-C  meloxicam (MOBIC) 15 MG tablet TK 1 T PO QD 04/26/17  Yes [provider]  metFORMIN (GLUCOPHAGE) 1000 MG tablet TAKE 1 TABLET(1000 MG) BY MOUTH TWICE DAILY WITH A MEAL 09/17/17  Yes [name] M, PA-C  Multiple Vitamin (MULTI VITAMIN DAILY PO) Take 1 tablet by mouth daily.   Yes [provider]  Omega-3 Fatty Acids (FISH OIL) 1200 MG CAPS Take 1 capsule by mouth 2 (two) times daily.   Yes [provider]  pravastatin (PRAVACHOL) 80 MG tablet TAKE 1 TABLET(80 MG) BY MOUTH DAILY 12/13/16  Yes Mar Daring, PA-C  Blood Glucose Monitoring Suppl (ONETOUCH VERIO IQ SYSTEM) W/DEVICE KIT Check blood sugar a.c.h.s. 04/19/15   Mar Daring, PA-C  glucose blood (ONETOUCH VERIO) test strip To check blood sugar once daily 03/07/17    Mar Daring, PA-C  [facility] DELICA LANCETS FINE MISC To check blood sugar once to twice daily 05/15/17   Mar Daring, PA-C  UNABLE TO FIND OneTouch Delica Lancets 30 gauge    [provider]    Allergies as of 09/18/2017  . (No Known Allergies)    History reviewed. No pertinent family history.  Social History   Socioeconomic History  . Marital status: Married    Spouse name: Not on file  . Number of children: 6  . Years of education: Not on file  . Highest education level: GED or equivalent  Occupational History  . Occupation: retired  Scientific laboratory technician  . Financial resource strain: Not hard at all  . Food insecurity:    Worry: Never true    Inability: Never true  . Transportation needs:    Medical: No    Non-medical: No  Tobacco Use  . Smoking status: Never Smoker  . Smokeless tobacco: Never Used  Substance and Sexual Activity  . Alcohol use: No    Frequency: Never  . Drug use: No  . Sexual activity: Not on file  Lifestyle  . Physical activity:    Days per week: Not on file    Minutes per session: Not on file  . Stress: Not at all  Relationships  . Social connections:    Talks on phone: Not on file    Gets together: Not on file    Attends  religious service: Not on file    Active member of club or organization: Not on file    Attends meetings of clubs or organizations: Not on file    Relationship status: Not on file  . Intimate partner violence:    Fear of current or ex partner: No    Emotionally abused: No    Physically abused: No    Forced sexual activity: No  Other Topics Concern  . Not on file  Social History Narrative  . Not on file    Review of Systems: See HPI, otherwise negative ROS  Physical Exam: BP (!) 155/74   Pulse 81   Temp (!) 96.4 F (35.8 C) (Tympanic)   Resp 17   Ht '5\' 1"'$  (1.549 m)   Wt 194 lb (88 kg)   SpO2 99%   BMI 36.66 kg/m  General:   Alert,  pleasant and cooperative in NAD Head:  Normocephalic  and atraumatic. Neck:  Supple; no masses or thyromegaly. Lungs:  Clear throughout to auscultation, normal respiratory effort.    Heart:  +S1, +S2, Regular rate and rhythm, No edema. Abdomen:  Soft, nontender and nondistended. Normal bowel sounds, without guarding, and without rebound.   Neurologic:  Alert and  oriented x4;  grossly normal neurologically.  Impression/Plan: Kathleen Tran is here for a colonoscopy to be performed for positive cologuard Risks, benefits, limitations, and alternatives regarding  colonoscopy have been reviewed with the patient.  Questions have been answered.  All parties agreeable.   Virgel Manifold, MD  09/26/2017, 8:59 AM

## 2017-09-26 NOTE — Anesthesia Preprocedure Evaluation (Signed)
Anesthesia Evaluation  Patient identified by MRN, date of birth, ID band Patient awake    Reviewed: Allergy & Precautions, H&P , NPO status , Patient's Chart, lab work & pertinent test results  History of Anesthesia Complications Negative for: history of anesthetic complications  Airway Mallampati: III  TM Distance: <3 FB Neck ROM: limited    Dental  (+) Missing, Poor Dentition, Edentulous Upper, Edentulous Lower, Upper Dentures, Lower Dentures   Pulmonary neg pulmonary ROS, neg shortness of breath,           Cardiovascular Exercise Tolerance: Good hypertension, (-) angina(-) Past MI and (-) DOE      Neuro/Psych negative neurological ROS  negative psych ROS   GI/Hepatic Neg liver ROS, GERD  Controlled and Medicated,  Endo/Other  diabetes, Type 2  Renal/GU negative Renal ROS  negative genitourinary   Musculoskeletal  (+) Arthritis ,   Abdominal   Peds  Hematology negative hematology ROS (+)   Anesthesia Other Findings Past Medical History: No date: Arthritis No date: Diabetes mellitus without complication ([facility]) No date: GERD (gastroesophageal reflux disease) No date: Hyperlipidemia  Past Surgical History: No date: APPENDECTOMY No date: PARTIAL HIP ARTHROPLASTY; Right No date: TONSILLECTOMY  BMI    Body Mass Index:  36.66 kg/m      Reproductive/Obstetrics negative OB ROS                             Anesthesia Physical Anesthesia Plan  ASA: III  Anesthesia Plan: General   Post-op Pain Management:    Induction: Intravenous  PONV Risk Score and Plan: Propofol infusion and TIVA  Airway Management Planned: Natural Airway and Nasal Cannula  Additional Equipment:   Intra-op Plan:   Post-operative Plan:   Informed Consent: I have reviewed the patients History and Physical, chart, labs and discussed the procedure including the risks, benefits and alternatives for the  proposed anesthesia with the patient or authorized representative who has indicated his/her understanding and acceptance.   Dental Advisory Given  Plan Discussed with: Anesthesiologist, CRNA and Surgeon  Anesthesia Plan Comments: (Patient consented for risks of anesthesia including but not limited to:  - adverse reactions to medications - risk of intubation if required - damage to teeth, lips or other oral mucosa - sore throat or hoarseness - Damage to heart, brain, lungs or loss of life  Patient voiced understanding.)        Anesthesia Quick Evaluation

## 2017-09-26 NOTE — Op Note (Signed)
[facility] Patient Name: Kathleen Tran Procedure Date: 09/26/2017 9:10 AM MRN: [id number] Account #: [id number] Date of Birth: 17-Jan-1941 Admit Type: Outpatient Age: 77 Room: [facility] ENDO ROOM 2 Gender: Female Note Status: Finalized Procedure:            Colonoscopy Indications:          Positive Cologuard test Providers:            Kammie Scioli B. Bonna Gains MD, MD Referring MD:         Mar Daring (Referring MD) Medicines:            Monitored Anesthesia Care Complications:        No immediate complications. Procedure:            Pre-Anesthesia Assessment:                       - ASA Grade Assessment: III - A patient with severe                        systemic disease.                       - Prior to the procedure, a History and Physical was                        performed, and patient medications, allergies and                        sensitivities were reviewed. The patient's tolerance of                        previous anesthesia was reviewed.                       - The risks and benefits of the procedure and the                        sedation options and risks were discussed with the                        patient. All questions were answered and informed                        consent was obtained.                       - Patient identification and proposed procedure were                        verified prior to the procedure by the physician, the                        nurse, the anesthesiologist, the anesthetist and the                        technician. The procedure was verified in the procedure                        room.                       After obtaining  informed consent, the colonoscope was                        passed under direct vision. Throughout the procedure,                        the patient's blood pressure, pulse, and oxygen                        saturations were monitored continuously. The     Colonoscope was introduced through the anus and                        advanced to the the cecum, identified by appendiceal                        orifice and ileocecal valve. The colonoscopy was                        performed with ease. The patient tolerated the                        procedure well. The quality of the bowel preparation                        was good. Findings:      The perianal exam findings include non-thrombosed external hemorrhoids.      A 8 mm polyp was found in the cecum. The polyp was sessile. The polyp       was removed with a cold snare. Resection and retrieval were complete.       The polyp was removed in one piece but when it was suctioned into the       suction channel it broke into small pieces.      Six flat polyps were found in the ascending colon. The polyps were 4 to       8 mm in size. These polyps were removed with a cold snare. Resection and       retrieval were complete.      Two flat polyps were found in the descending colon. The polyps were 4 to       10 mm in size. These polyps were removed with a cold snare. Resection       and retrieval were complete. The larger 10 mm polyp was removed in one       piece but broke into smaller pieces when suctioned into the suction       channel.      The exam was otherwise without abnormality.      The rectum, sigmoid colon, descending colon, transverse colon, ascending       colon and cecum appeared normal.      No additional abnormalities were found on retroflexion. Impression:           - Non-thrombosed external hemorrhoids found on perianal                        exam.                       - One 8 mm polyp in the cecum, removed with a cold  snare. Resected and retrieved.                       - Six 4 to 8 mm polyps in the ascending colon, removed                        with a cold snare. Resected and retrieved.                       - Two 4 to 10 mm polyps in the descending  colon,                        removed with a cold snare. Resected and retrieved.                       - The examination was otherwise normal.                       - The rectum, sigmoid colon, descending colon,                        transverse colon, ascending colon and cecum are normal. Recommendation:       - Discharge patient to home (with escort).                       - Advance diet as tolerated.                       - Continue present medications.                       - Await pathology results.                       - Repeat colonoscopy date to be determined after                        pending pathology results are reviewed for surveillance.                       - The findings and recommendations were discussed with                        the patient.                       - The findings and recommendations were discussed with                        the patient's family.                       - Return to primary care physician as previously                        scheduled.                       - High fiber diet.                       - Return to my office in 4 weeks. Procedure Code(s):    ---  Professional ---                       [phone], Colonoscopy, flexible; with removal of tumor(s),                        polyp(s), or other lesion(s) by snare technique Diagnosis Code(s):    --- Professional ---                       D12.0, Benign neoplasm of cecum                       D12.2, Benign neoplasm of ascending colon                       D12.4, Benign neoplasm of descending colon                       K64.4, Residual hemorrhoidal skin tags                       R19.5, Other fecal abnormalities CPT copyright 2016 American Medical Association. All rights reserved. The codes documented in this report are preliminary and upon coder review may  be revised to meet current compliance requirements.  Vonda Antigua, MD Margretta Sidle B. Bonna Gains MD, MD 09/26/2017 10:27:41 AM This  report has been signed electronically. Number of Addenda: 0 Note Initiated On: 09/26/2017 9:10 AM Scope Withdrawal Time: 0 hours 48 minutes 57 seconds  Total Procedure Duration: 0 hours 59 minutes 59 seconds  Estimated Blood Loss: Estimated blood loss: none.      [facility]

## 2017-09-27 LAB — SURGICAL PATHOLOGY

## 2017-09-27 NOTE — Anesthesia Postprocedure Evaluation (Signed)
Anesthesia Post Note  Patient: Dentist  Procedure(s) Performed: COLONOSCOPY WITH PROPOFOL (N/A )  Patient location during evaluation: Endoscopy Anesthesia Type: General Level of consciousness: awake and alert Pain management: pain level controlled Vital Signs Assessment: post-procedure vital signs reviewed and stable Respiratory status: spontaneous breathing, nonlabored ventilation, respiratory function stable and patient connected to nasal cannula oxygen Cardiovascular status: blood pressure returned to baseline and stable Postop Assessment: no apparent nausea or vomiting Anesthetic complications: no     Last Vitals:  Vitals:   09/26/17 1018 09/26/17 1048  BP: (!) 119/56 (!) 119/56  Pulse:    Resp:    Temp: (!) 36.1 C   SpO2:      Last Pain:  Vitals:   09/26/17 1028  TempSrc:   PainSc: 0-No pain                 Precious Haws Max Nuno

## 2017-10-08 ENCOUNTER — Encounter: Payer: Self-pay | Admitting: Gastroenterology

## 2017-11-12 ENCOUNTER — Ambulatory Visit: Payer: Medicare Other | Admitting: Podiatry

## 2017-11-12 ENCOUNTER — Encounter: Payer: Self-pay | Admitting: Podiatry

## 2017-11-12 DIAGNOSIS — M79675 Pain in left toe(s): Secondary | ICD-10-CM | POA: Diagnosis not present

## 2017-11-12 DIAGNOSIS — M79674 Pain in right toe(s): Secondary | ICD-10-CM | POA: Diagnosis not present

## 2017-11-12 DIAGNOSIS — E1159 Type 2 diabetes mellitus with other circulatory complications: Secondary | ICD-10-CM | POA: Diagnosis not present

## 2017-11-12 DIAGNOSIS — B351 Tinea unguium: Secondary | ICD-10-CM | POA: Diagnosis not present

## 2017-11-12 NOTE — Progress Notes (Signed)
Complaint:  Visit Type: Patient returns to my office for continued preventative foot care services. Complaint: Patient states" my nails have grown long and thick and become painful to walk and wear shoes" Patient has been diagnosed with DM with no foot complications. The patient presents for preventative foot care services. No changes to ROS  Podiatric Exam: Vascular: dorsalis pedis and posterior tibial pulses are not  palpable bilateral. Capillary return is immediate. Temperature gradient is WNL. Skin turgor WNL  Sensorium: Normal Semmes Weinstein monofilament test. Normal tactile sensation bilaterally. Nail Exam: Pt has thick disfigured discolored nails with subungual debris noted bilateral entire nail hallux through fifth toenails Ulcer Exam: There is no evidence of ulcer or pre-ulcerative changes or infection. Orthopedic Exam: Muscle tone and strength are WNL. No limitations in general ROM. No crepitus or effusions noted. Foot type and digits show no abnormalities. Bony prominences are unremarkable. Skin: No Porokeratosis. No infection or ulcers  Diagnosis:  Onychomycosis, , Pain in right toe, pain in left toes  Treatment & Plan Procedures and Treatment: Consent by patient was obtained for treatment procedures.   Debridement of mycotic and hypertrophic toenails, 1 through 5 bilateral and clearing of subungual debris. No ulceration, no infection noted. Recommend formula 3.  Return Visit-Office Procedure: Patient instructed to return to the office for a follow up visit 3 months for continued evaluation and treatment.    Gardiner Barefoot DPM

## 2017-11-26 ENCOUNTER — Other Ambulatory Visit: Payer: Self-pay | Admitting: Physician Assistant

## 2017-11-26 DIAGNOSIS — E119 Type 2 diabetes mellitus without complications: Secondary | ICD-10-CM

## 2017-11-26 DIAGNOSIS — I10 Essential (primary) hypertension: Secondary | ICD-10-CM

## 2017-11-26 DIAGNOSIS — E7800 Pure hypercholesterolemia, unspecified: Secondary | ICD-10-CM

## 2017-12-05 ENCOUNTER — Ambulatory Visit (INDEPENDENT_AMBULATORY_CARE_PROVIDER_SITE_OTHER): Payer: Medicare Other | Admitting: Gastroenterology

## 2017-12-05 ENCOUNTER — Other Ambulatory Visit: Payer: Self-pay

## 2017-12-05 ENCOUNTER — Encounter: Payer: Self-pay | Admitting: Gastroenterology

## 2017-12-05 VITALS — BP 133/70 | HR 76 | Temp 98.3°F | Ht 61.0 in | Wt 195.8 lb

## 2017-12-05 DIAGNOSIS — Z860100 Personal history of colon polyps, unspecified: Secondary | ICD-10-CM

## 2017-12-05 NOTE — Progress Notes (Signed)
Vonda Antigua, MD [address]  [place]  [place], [place] [address]  Main: [phone]  Fax: [phone]   Primary Care Physician: Mar Daring, PA-C  Primary Gastroenterologist:  Dr. Vonda Antigua  Chief Complaint  Patient presents with  . Follow-up    3 month colonoscopy 09/26/2017    HPI: Kathleen Tran is a 77 y.o. female here for follow-up of positive Cologuard.  Patient was initially referred to [place] for positive Cologuard, and has since undergone colonoscopy in March 2019.  9 polyps were removed, largest was 10 mm in size.  Polyps showed tubular adenoma.  Repeat recommended in 2 years.  Patient denies any blood in stool, abdominal pain, nausea or vomiting, dysphagia, dyspepsia, or altered bowel habits.  Reports normal appetite.  Current Outpatient Medications  Medication Sig Dispense Refill  . aspirin 81 MG tablet Take 81 mg by mouth daily.    . Blood Glucose Monitoring Suppl (ONETOUCH VERIO IQ SYSTEM) W/DEVICE KIT Check blood sugar a.c.h.s. 1 kit 0  . Calcium Carbonate-Vitamin D 600-125 MG-UNIT TABS Take by mouth daily.    Marland Kitchen glipiZIDE (GLUCOTROL) 5 MG tablet TAKE 1/2 TABLET BY MOUTH EVERY DAY BEFORE SUPPER 90 tablet 1  . glucose blood (ONETOUCH VERIO) test strip To check blood sugar once daily 100 each 3  . losartan (COZAAR) 50 MG tablet TAKE 1 TABLET(50 MG) BY MOUTH DAILY 90 tablet 1  . meloxicam (MOBIC) 15 MG tablet TK 1 T PO QD  1  . metFORMIN (GLUCOPHAGE) 1000 MG tablet TAKE 1 TABLET(1000 MG) BY MOUTH TWICE DAILY WITH A MEAL 180 tablet 1  . Multiple Vitamin (MULTI VITAMIN DAILY PO) Take 1 tablet by mouth daily.    . Omega-3 Fatty Acids (FISH OIL) 1200 MG CAPS Take 1 capsule by mouth 2 (two) times daily.    [name] DELICA LANCETS FINE MISC USE TO CHECK BLOOD SUGAR ONCE TO TWICE DAILY 100 each 5  . pravastatin (PRAVACHOL) 80 MG tablet TAKE 1 TABLET(80 MG) BY MOUTH DAILY 90 tablet 1  . UNABLE TO FIND OneTouch Delica Lancets 30 gauge     No  current facility-administered medications for this visit.     Allergies as of 12/05/2017 - Review Complete 12/05/2017  Allergen Reaction Noted  . Citrus  09/26/2017    ROS:  General: Negative for anorexia, weight loss, fever, chills, fatigue, weakness. ENT: Negative for hoarseness, difficulty swallowing , nasal congestion. CV: Negative for chest pain, angina, palpitations, dyspnea on exertion, peripheral edema.  Respiratory: Negative for dyspnea at rest, dyspnea on exertion, cough, sputum, wheezing.  GI: See history of present illness. GU:  Negative for dysuria, hematuria, urinary incontinence, urinary frequency, nocturnal urination.  Endo: Negative for unusual weight change.    Physical Examination:   BP 133/70   Pulse 76   Temp 98.3 F (36.8 C) (Oral)   Ht '5\' 1"'$  (1.549 m)   Wt 195 lb 12.8 oz (88.8 kg)   BMI 37.00 kg/m   General: Well-nourished, well-developed in no acute distress.  Eyes: No icterus. Conjunctivae pink. Mouth: Oropharyngeal mucosa moist and pink , no lesions erythema or exudate. Neck: Supple, Trachea midline Abdomen: Bowel sounds are normal, nontender, nondistended, no hepatosplenomegaly or masses, no abdominal bruits or hernia , no rebound or guarding.   Extremities: No lower extremity edema. No clubbing or deformities. Neuro: Alert and oriented x 3.  Grossly intact. Skin: Warm and dry, no jaundice.   Psych: Alert and cooperative, normal mood and affect.  Labs: CMP     Component Value Date/Time   NA 145 (H) 08/08/2017 1346   K 4.8 08/08/2017 1346   CL 105 08/08/2017 1346   CO2 24 08/08/2017 1346   GLUCOSE 116 (H) 08/08/2017 1346   BUN 22 08/08/2017 1346   CREATININE 0.67 08/08/2017 1346   CALCIUM 10.3 08/08/2017 1346   PROT 6.9 07/13/2017 1031   ALBUMIN 4.2 07/13/2017 1031   AST 19 07/13/2017 1031   ALT 17 07/13/2017 1031   ALKPHOS 65 07/13/2017 1031   BILITOT 0.3 07/13/2017 1031   GFRNONAA 87 08/08/2017 1346   GFRAA 100 08/08/2017 1346    Lab Results  Component Value Date   WBC 6.1 07/13/2017   HGB 13.4 07/13/2017   HCT 40.5 07/13/2017   MCV 94 07/13/2017   PLT 331 07/13/2017    Imaging Studies: No results found.  Assessment and Plan:   Kathleen Tran is a 77 y.o. y/o female here for follow-up post colonoscopy, done for positive Cologuard, with colonoscopy showing 9 polyps that were all completely removed, largest  10 mm in size, and polyps showing tubular adenoma  Patient doing very well clinically Repeat colonoscopy recommended in 2 years from March 2019 If patient has any new symptoms in the interim, she was asked to contact us, and she verbalized understanding.  These include but are not limited to blood in stool, altered bowel habits, new abdominal pain, nausea or vomiting, weight loss, loss of appetite, or any other reason for concern.  She verbalized understanding.    Follow-up with primary care physician  Dr Vonda Antigua

## 2018-02-06 ENCOUNTER — Encounter: Payer: Self-pay | Admitting: Physician Assistant

## 2018-02-06 ENCOUNTER — Ambulatory Visit
Admission: RE | Admit: 2018-02-06 | Discharge: 2018-02-06 | Disposition: A | Discharge status: Home / Self Care | Payer: Medicare Other | Source: Ambulatory Visit | Attending: Physician Assistant | Admitting: Physician Assistant

## 2018-02-06 ENCOUNTER — Ambulatory Visit (INDEPENDENT_AMBULATORY_CARE_PROVIDER_SITE_OTHER): Payer: Medicare Other | Admitting: Physician Assistant

## 2018-02-06 VITALS — BP 140/70 | HR 63 | Temp 98.6°F | Resp 16 | Wt 191.8 lb

## 2018-02-06 DIAGNOSIS — K219 Gastro-esophageal reflux disease without esophagitis: Secondary | ICD-10-CM

## 2018-02-06 DIAGNOSIS — I700 Atherosclerosis of aorta: Secondary | ICD-10-CM | POA: Insufficient documentation

## 2018-02-06 DIAGNOSIS — M5442 Lumbago with sciatica, left side: Secondary | ICD-10-CM | POA: Diagnosis not present

## 2018-02-06 DIAGNOSIS — Q799 Congenital malformation of musculoskeletal system, unspecified: Secondary | ICD-10-CM | POA: Insufficient documentation

## 2018-02-06 DIAGNOSIS — M19011 Primary osteoarthritis, right shoulder: Secondary | ICD-10-CM | POA: Diagnosis not present

## 2018-02-06 DIAGNOSIS — G8929 Other chronic pain: Secondary | ICD-10-CM

## 2018-02-06 MED ORDER — GABAPENTIN 100 MG PO CAPS: 100.0000 mg | ORAL_CAPSULE | Freq: Three times a day (TID) | ORAL | 0 refills | Status: DC

## 2018-02-06 MED ORDER — OMEPRAZOLE 40 MG PO CPDR: 40.0000 mg | DELAYED_RELEASE_CAPSULE | Freq: Every day | ORAL | 3 refills | Status: DC

## 2018-02-06 NOTE — Progress Notes (Signed)
Patient: Kathleen Tran Female    DOB: [birth year]   77 y.o.   MRN: [id number] Visit Date: 02/06/2018  Today's Provider: Mar Daring, PA-C   Chief Complaint  Patient presents with  . GI Problem   Subjective:    HPI Patient here today with c/o stomach problem. She reports a lot of time when she eats she feels nauseous. She reports she is gassy and she suffers from constipation. She reports worsening symptoms after the Colonoscopy. She also has been taking more meloxicam daily for arthralgias (right arm and left leg).   She also c/o lump I between the breast. She notice the lump 2-3 weeks ago. She reports that if you press on it it hurts. She reports other than that it doesn't bother her. No drainage,no redness. It is overlying the bottom of the sternum.   She also c/o of right arm pain. Reports the pain is present when she gets up. This has been going on for the past 3 weeks. Reports No injury. No redness. No swelling. Reports it hurts from the shoulder to the mid biceps with moving her arm when she first gets up in the morning but by mid-morning the pain has improved. No numbness, tingling. Does note weakness in hands bilaterally.   Also complains of left leg pain. This has been an ongoing issue. 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 She was previously getting injections but stopped going because they did not offer prolonged relief. She has also done PT which she felt was helping but was too expensive. She does continue to do the exercises at home per patient.     Allergies  Allergen Reactions  . Citrus      Current Outpatient Medications:  .  aspirin 81 MG tablet, Take 81 mg by mouth daily., Disp: , Rfl:  .  Blood Glucose Monitoring Suppl (ONETOUCH VERIO IQ SYSTEM) W/DEVICE KIT, Check blood sugar a.c.h.s., Disp: 1 kit, Rfl: 0 .  Calcium Carbonate-Vitamin D 600-125 MG-UNIT TABS, Take by mouth daily., Disp: , Rfl:  .   glipiZIDE (GLUCOTROL) 5 MG tablet, TAKE 1/2 TABLET BY MOUTH EVERY DAY BEFORE SUPPER, Disp: 90 tablet, Rfl: 1 .  glucose blood (ONETOUCH VERIO) test strip, To check blood sugar once daily, Disp: 100 each, Rfl: 3 .  losartan (COZAAR) 50 MG tablet, TAKE 1 TABLET(50 MG) BY MOUTH DAILY, Disp: 90 tablet, Rfl: 1 .  meloxicam (MOBIC) 15 MG tablet, TK 1 T PO QD, Disp: , Rfl: 1 .  metFORMIN (GLUCOPHAGE) 1000 MG tablet, TAKE 1 TABLET(1000 MG) BY MOUTH TWICE DAILY WITH A MEAL, Disp: 180 tablet, Rfl: 1 .  Multiple Vitamin (MULTI VITAMIN DAILY PO), Take 1 tablet by mouth daily., Disp: , Rfl:  .  Omega-3 Fatty Acids (FISH OIL) 1200 MG CAPS, Take 1 capsule by mouth 2 (two) times daily., Disp: , Rfl:  .  ONETOUCH DELICA LANCETS FINE MISC, USE TO CHECK BLOOD SUGAR ONCE TO TWICE DAILY, Disp: 100 each, Rfl: 5 .  pravastatin (PRAVACHOL) 80 MG tablet, TAKE 1 TABLET(80 MG) BY MOUTH DAILY, Disp: 90 tablet, Rfl: 1 .  UNABLE TO FIND, OneTouch Delica Lancets 30 gauge, Disp: , Rfl:   Review of Systems  Constitutional: Negative for appetite change, fatigue and fever.  Respiratory: Negative for cough, chest tightness, shortness of breath and wheezing.   Cardiovascular: Negative for chest pain, palpitations and leg swelling.  Gastrointestinal: Positive for  abdominal distention, abdominal pain, constipation ("sometimes") and nausea. Negative for anal bleeding, blood in stool, diarrhea, rectal pain and vomiting.  Genitourinary: Negative.   Musculoskeletal: Positive for arthralgias, back pain and myalgias. Negative for neck pain and neck stiffness.  Skin: Negative.   Neurological: Positive for weakness and numbness.    Social History   Tobacco Use  . Smoking status: Never Smoker  . Smokeless tobacco: Never Used  Substance Use Topics  . Alcohol use: No    Frequency: Never   Objective:   BP 140/70 (BP Location: Left Arm, Patient Position: Sitting, Cuff Size: Normal)   Pulse 63   Temp 98.6 F (37 C) (Oral)   Resp 16    Wt 191 lb 12.8 oz (87 kg)   SpO2 97%   BMI 36.24 kg/m  Vitals:   02/06/18 0939  BP: 140/70  Pulse: 63  Resp: 16  Temp: 98.6 F (37 C)  TempSrc: Oral  SpO2: 97%  Weight: 191 lb 12.8 oz (87 kg)     Physical Exam  Constitutional: She is oriented to person, place, and time. She appears well-developed and well-nourished. No distress.  Cardiovascular: Normal rate, regular rhythm and normal heart sounds. Exam reveals no gallop and no friction rub.  No murmur heard. Pulmonary/Chest: Effort normal and breath sounds normal. No respiratory distress. She has no wheezes. She has no rales. She exhibits mass. She exhibits no tenderness and no crepitus.    Abdominal: Soft. Normal appearance and bowel sounds are normal. She exhibits no distension and no mass. There is no hepatosplenomegaly. There is tenderness in the epigastric area. There is no rebound, no guarding and no CVA tenderness.  Musculoskeletal:       Right shoulder: She exhibits decreased range of motion and tenderness. She exhibits no bony tenderness, no spasm, normal pulse and normal strength.       Left hip: She exhibits decreased range of motion, decreased strength and tenderness. She exhibits no bony tenderness.  Neurological: She is alert and oriented to person, place, and time.  Skin: Skin is warm and dry. She is not diaphoretic.  Vitals reviewed.       Assessment & Plan:     1. Gastroesophageal reflux disease without esophagitis Stop meloxicam. Use tylenol only. Will add omeprazole as below. I will see her back in 2-3 weeks. She is to call if symptoms worsen in the meantime.  - omeprazole (PRILOSEC) 40 MG capsule; Take 1 capsule (40 mg total) by mouth daily.  Dispense: 30 capsule; Refill: 3  2. Chronic left-sided low back pain with left-sided sciatica Stop meloxicam. Secondary to herniated disc in lumbar spine and hip bursitis aggravating the sciatic nerve. Will try gabapentin as below. I will see her back in 2 weeks  and will dose adjust as tolerated.  - gabapentin (NEURONTIN) 100 MG capsule; Take 1 capsule (100 mg total) by mouth 3 (three) times daily.  Dispense: 90 capsule; Refill: 0  3. Bony abnormality Unsure of source. Will get xray to r/o bony abnormality. I will f/u pending results.  - DG Chest 2 View; Future  4. Primary osteoarthritis of right shoulder Continue home exercises. Use tylenol prn.        Mar Daring, PA-C  [facility]

## 2018-02-06 NOTE — Patient Instructions (Signed)
Gabapentin capsules or tablets What is this medicine? GABAPENTIN (GA ba pen tin) is used to control partial seizures in adults with epilepsy. It is also used to treat certain types of nerve pain. This medicine may be used for other purposes; ask your health care provider or pharmacist if you have questions. COMMON BRAND NAME(S): Active-PAC with Gabapentin, Gabarone, Neurontin What should I tell my health care provider before I take this medicine? They need to know if you have any of these conditions: -kidney disease -suicidal thoughts, plans, or attempt; a previous suicide attempt by you or a family member -an unusual or allergic reaction to gabapentin, other medicines, foods, dyes, or preservatives -pregnant or trying to get pregnant -breast-feeding How should I use this medicine? Take this medicine by mouth with a glass of water. Follow the directions on the prescription label. You can take it with or without food. If it upsets your stomach, take it with food.Take your medicine at regular intervals. Do not take it more often than directed. Do not stop taking except on your doctor's advice. If you are directed to break the 600 or 800 mg tablets in half as part of your dose, the extra half tablet should be used for the next dose. If you have not used the extra half tablet within 28 days, it should be thrown away. A special MedGuide will be given to you by the pharmacist with each prescription and refill. Be sure to read this information carefully each time. Talk to your pediatrician regarding the use of this medicine in children. Special care may be needed. Overdosage: If you think you have taken too much of this medicine contact a poison control center or emergency room at once. NOTE: This medicine is only for you. Do not share this medicine with others. What if I miss a dose? If you miss a dose, take it as soon as you can. If it is almost time for your next dose, take only that dose. Do not  take double or extra doses. What may interact with this medicine? Do not take this medicine with any of the following medications: -other gabapentin products This medicine may also interact with the following medications: -alcohol -antacids -antihistamines for allergy, cough and cold -certain medicines for anxiety or sleep -certain medicines for depression or psychotic disturbances -homatropine; hydrocodone -naproxen -narcotic medicines (opiates) for pain -phenothiazines like chlorpromazine, mesoridazine, prochlorperazine, thioridazine This list may not describe all possible interactions. Give your health care provider a list of all the medicines, herbs, non-prescription drugs, or dietary supplements you use. Also tell them if you smoke, drink alcohol, or use illegal drugs. Some items may interact with your medicine. What should I watch for while using this medicine? Visit your doctor or health care professional for regular checks on your progress. You may want to keep a record at home of how you feel your condition is responding to treatment. You may want to share this information with your doctor or health care professional at each visit. You should contact your doctor or health care professional if your seizures get worse or if you have any new types of seizures. Do not stop taking this medicine or any of your seizure medicines unless instructed by your doctor or health care professional. Stopping your medicine suddenly can increase your seizures or their severity. Wear a medical identification bracelet or chain if you are taking this medicine for seizures, and carry a card that lists all your medications. You may get drowsy, dizzy,   or have blurred vision. Do not drive, use machinery, or do anything that needs mental alertness until you know how this medicine affects you. To reduce dizzy or fainting spells, do not sit or stand up quickly, especially if you are an older patient. Alcohol can  increase drowsiness and dizziness. Avoid alcoholic drinks. Your mouth may get dry. Chewing sugarless gum or sucking hard candy, and drinking plenty of water will help. The use of this medicine may increase the chance of suicidal thoughts or actions. Pay special attention to how you are responding while on this medicine. Any worsening of mood, or thoughts of suicide or dying should be reported to your health care professional right away. Women who become pregnant while using this medicine may enroll in the [place] Pregnancy Registry by calling [phone]. This registry collects information about the safety of antiepileptic drug use during pregnancy. What side effects may I notice from receiving this medicine? Side effects that you should report to your doctor or health care professional as soon as possible: -allergic reactions like skin rash, itching or hives, swelling of the face, lips, or tongue -worsening of mood, thoughts or actions of suicide or dying Side effects that usually do not require medical attention (report to your doctor or health care professional if they continue or are bothersome): -constipation -difficulty walking or controlling muscle movements -dizziness -nausea -slurred speech -tiredness -tremors -weight gain This list may not describe all possible side effects. Call your doctor for medical advice about side effects. You may report side effects to FDA at 1-800-FDA-1088. Where should I keep my medicine? Keep out of reach of children. This medicine may cause accidental overdose and death if it taken by other adults, children, or pets. Mix any unused medicine with a substance like cat litter or coffee grounds. Then throw the medicine away in a sealed container like a sealed bag or a coffee can with a lid. Do not use the medicine after the expiration date. Store at room temperature between 15 and 30 degrees C (59 and 86 degrees F). NOTE: This  sheet is a summary. It may not cover all possible information. If you have questions about this medicine, talk to your doctor, pharmacist, or health care provider.  2018 Elsevier/Gold Standard (2013-08-15 15:26:50) Omeprazole tablets (OTC) What is this medicine? OMEPRAZOLE (oh ME pray zol) prevents the production of acid in the stomach. It is used to treat the symptoms of heartburn. You can buy this medicine without a prescription. This product is not for long-term use, unless otherwise directed by your doctor or health care professional. This medicine may be used for other purposes; ask your health care provider or pharmacist if you have questions. COMMON BRAND NAME(S): Prilosec OTC What should I tell my health care provider before I take this medicine? They need to know if you have any of these conditions: -black or bloody stools -chest pain -difficulty swallowing -have had heartburn for over 3 months -have heartburn with dizziness, lightheadedness or sweating -liver disease -lupus -stomach pain -unexplained weight loss -vomiting with blood -wheezing -an unusual or allergic reaction to omeprazole, other medicines, foods, dyes, or preservatives -pregnant or trying to get pregnant -breast-feeding How should I use this medicine? Take this medicine by mouth. Follow the directions on the product label. If you are taking this medicine without a prescription, take one tablet every day. Do not use for longer than 14 days or repeat a course of treatment more often than every 4 months  unless directed by a doctor or healthcare professional. Take your dose at regular intervals every 24 hours. Swallow the tablet whole with a drink of water. Do not crush, break or chew. This medicine works best if taken on an empty stomach 30 minutes before breakfast. If you are using this medicine with the prescription of your doctor or healthcare professional, follow the directions you were given. Do not take your  medicine more often than directed. Talk to your pediatrician regarding the use of this medicine in children. Special care may be needed. Overdosage: If you think you have taken too much of this medicine contact a poison control center or emergency room at once. NOTE: This medicine is only for you. Do not share this medicine with others. What if I miss a dose? If you miss a dose, take it as soon as you can. If it is almost time for your next dose, take only that dose. Do not take double or extra doses. What may interact with this medicine? Do not take this medicine with any of the following medications: -atazanavir -clopidogrel -nelfinavir This medicine may also interact with the following medications: -ampicillin -certain medicines for anxiety or sleep -certain medicines that treat or prevent blood clots like warfarin -cyclosporine -diazepam -digoxin -disulfiram -iron salts -methotrexate -mycophenolate mofetil -phenytoin -prescription medicine for fungal or yeast infection like itraconazole, ketoconazole, voriconazole -saquinavir -tacrolimus This list may not describe all possible interactions. Give your health care provider a list of all the medicines, herbs, non-prescription drugs, or dietary supplements you use. Also tell them if you smoke, drink alcohol, or use illegal drugs. Some items may interact with your medicine. What should I watch for while using this medicine? It can take several days before your heartburn gets better. Check with your doctor or health care professional if your condition does not start to get better, or if it gets worse. Do not treat diarrhea with over the counter products. Contact your doctor if you have diarrhea that lasts more than 2 days or if it is severe and watery. Do not treat yourself for heartburn with this medicine for more than 14 days in a row. You should only use this medicine for a 2-week treatment period once every 4 months. If your symptoms  return shortly after your therapy is complete, or within the 4 month time frame, call your doctor or health care professional. What side effects may I notice from receiving this medicine? Side effects that you should report to your doctor or health care professional as soon as possible: -allergic reactions like skin rash, itching or hives, swelling of the face, lips, or tongue -bone, muscle or joint pain -breathing problems -chest pain or chest tightness -dark yellow or brown urine -diarrhea -dizziness -fast, irregular heartbeat -feeling faint or lightheaded -fever or sore throat -muscle spasm -palpitations -rash on cheeks or arms that gets worse in the sun -redness, blistering, peeling or loosening of the skin, including inside the mouth -seizures -tremors -unusual bleeding or bruising -unusually weak or tired -yellowing of the eyes or skin Side effects that usually do not require medical attention (report to your doctor or health care professional if they continue or are bothersome): -constipation -dry mouth -headache -loose stools -nausea This list may not describe all possible side effects. Call your doctor for medical advice about side effects. You may report side effects to FDA at 1-800-FDA-1088. Where should I keep my medicine? Keep out of the reach of children. Store at room temperature between 20  and 25 degrees C (68 and 77 degrees F). Protect from light and moisture. Throw away any unused medicine after the expiration date. NOTE: This sheet is a summary. It may not cover all possible information. If you have questions about this medicine, talk to your doctor, pharmacist, or health care provider.  2018 Elsevier/Gold Standard (2015-07-22 13:06:31)

## 2018-02-07 ENCOUNTER — Other Ambulatory Visit: Payer: Self-pay | Admitting: Physician Assistant

## 2018-02-07 ENCOUNTER — Telehealth: Payer: Self-pay

## 2018-02-07 DIAGNOSIS — E119 Type 2 diabetes mellitus without complications: Secondary | ICD-10-CM

## 2018-02-07 NOTE — Telephone Encounter (Signed)
-----   Message from Mar Daring, [place] sent at 02/06/2018  4:54 PM EDT ----- CXR appears normal. No abnormality noted on the bony sternum or soft tissue abnormality noted in what could be seen. 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 Lets just monitor the area and if it gets larger let me know and we will do more.

## 2018-02-07 NOTE — Telephone Encounter (Signed)
Patient advised as directed below.  Thanks,  -Benen Weida 

## 2018-02-14 ENCOUNTER — Ambulatory Visit: Payer: Medicare Other | Admitting: Podiatry

## 2018-02-14 ENCOUNTER — Telehealth: Payer: Self-pay | Admitting: Physician Assistant

## 2018-02-14 NOTE — Telephone Encounter (Signed)
Please Review.  Thanks,  -Joseline 

## 2018-02-14 NOTE — Telephone Encounter (Signed)
Pt has an appt wed the [address]  with you.  She called to say that the Gabapentin you prescribed is not helping.   She is still in a lot of pain.  She said she did not know if you wanted to call her before her appt and try to change the medication to something else. She is keeping the appt.   Pt's CB# [phone]  She uses [facility] [address]

## 2018-02-15 NOTE — Telephone Encounter (Signed)
Have her increase to taking 200mg  (2 capsules) three times per day. I will see her next week.

## 2018-02-15 NOTE — Telephone Encounter (Signed)
Patient reports it is her back, hip/leg and arm pain. Patient reports she is tolerating gabapentin. Please advise.

## 2018-02-15 NOTE — Telephone Encounter (Signed)
If she is tolerating the gabapentin we can increase that dose.   I want to confirm this is her back, leg/hip and arm pain she is referring to, not stomach.  Thanks.

## 2018-02-15 NOTE — Telephone Encounter (Signed)
Patient advised as below. Patient verbalizes understanding and is in agreement with treatment plan.  

## 2018-02-18 ENCOUNTER — Other Ambulatory Visit: Payer: Self-pay | Admitting: Physician Assistant

## 2018-02-18 DIAGNOSIS — E119 Type 2 diabetes mellitus without complications: Secondary | ICD-10-CM

## 2018-02-20 ENCOUNTER — Ambulatory Visit (INDEPENDENT_AMBULATORY_CARE_PROVIDER_SITE_OTHER): Payer: Medicare Other | Admitting: Physician Assistant

## 2018-02-20 ENCOUNTER — Encounter: Payer: Self-pay | Admitting: Physician Assistant

## 2018-02-20 VITALS — BP 140/80 | HR 68 | Temp 98.1°F | Resp 16 | Ht 61.0 in | Wt 193.0 lb

## 2018-02-20 DIAGNOSIS — G8929 Other chronic pain: Secondary | ICD-10-CM

## 2018-02-20 DIAGNOSIS — K2100 Gastro-esophageal reflux disease with esophagitis, without bleeding: Secondary | ICD-10-CM

## 2018-02-20 DIAGNOSIS — M5442 Lumbago with sciatica, left side: Secondary | ICD-10-CM

## 2018-02-20 DIAGNOSIS — M5441 Lumbago with sciatica, right side: Secondary | ICD-10-CM

## 2018-02-20 DIAGNOSIS — K219 Gastro-esophageal reflux disease without esophagitis: Secondary | ICD-10-CM | POA: Insufficient documentation

## 2018-02-20 MED ORDER — TRAMADOL HCL 50 MG PO TABS: 50.0000 mg | ORAL_TABLET | Freq: Three times a day (TID) | ORAL | 0 refills | Status: DC | PRN

## 2018-02-20 NOTE — Progress Notes (Signed)
Patient: Kathleen Tran Female    DOB: 1940/09/03   77 y.o.   MRN: [id number] Visit Date: 02/20/2018  Today's Provider: Mar Daring, PA-C   Chief Complaint  Patient presents with  . Follow-up   Subjective:    HPI  Follow up for GERD  The patient was last seen for this 2 weeks ago. Changes made at last visit include start omeprazole.  She reports excellent compliance with treatment. She feels that condition is Improved. She is not having side effects.   ------------------------------------------------------------------------------------   Follow up for lower back pain  The patient was last seen for this 2 weeks ago. Changes made at last visit include start gabapentin.  She reports excellent compliance with treatment. Patient reports taking gabapentin 200 mg TID. She feels that condition is Worse. She is not having side effects.  ------------------------------------------------------------------------------------      Allergies  Allergen Reactions  . Citrus      Current Outpatient Medications:  .  aspirin 81 MG tablet, Take 81 mg by mouth daily., Disp: , Rfl:  .  Blood Glucose Monitoring Suppl (ONETOUCH VERIO IQ SYSTEM) W/DEVICE KIT, Check blood sugar a.c.h.s., Disp: 1 kit, Rfl: 0 .  Calcium Carbonate-Vitamin D 600-125 MG-UNIT TABS, Take by mouth daily., Disp: , Rfl:  .  gabapentin (NEURONTIN) 100 MG capsule, Take 1 capsule (100 mg total) by mouth 3 (three) times daily. (Patient taking differently: Take 200 mg by mouth 3 (three) times daily. ), Disp: 90 capsule, Rfl: 0 .  glipiZIDE (GLUCOTROL) 5 MG tablet, TAKE ONE-HALF TABLET BY MOUTH EVERY DAY BEFORE SUPPER, Disp: 90 tablet, Rfl: 0 .  losartan (COZAAR) 50 MG tablet, TAKE 1 TABLET(50 MG) BY MOUTH DAILY, Disp: 90 tablet, Rfl: 1 .  metFORMIN (GLUCOPHAGE) 1000 MG tablet, TAKE 1 TABLET(1000 MG) BY MOUTH TWICE DAILY WITH A MEAL, Disp: 180 tablet, Rfl: 1 .  Multiple Vitamin (MULTI VITAMIN DAILY PO), Take 1  tablet by mouth daily., Disp: , Rfl:  .  Omega-3 Fatty Acids (FISH OIL) 1200 MG CAPS, Take 1 capsule by mouth 2 (two) times daily., Disp: , Rfl:  .  omeprazole (PRILOSEC) 40 MG capsule, Take 1 capsule (40 mg total) by mouth daily., Disp: 30 capsule, Rfl: 3 .  ONETOUCH DELICA LANCETS FINE MISC, USE TO CHECK BLOOD SUGAR ONCE TO TWICE DAILY, Disp: 100 each, Rfl: 5 .  ONETOUCH VERIO test strip, CHECK BLOOD SUGAR ONCE DAILY, Disp: 100 each, Rfl: 1 .  pravastatin (PRAVACHOL) 80 MG tablet, TAKE 1 TABLET(80 MG) BY MOUTH DAILY, Disp: 90 tablet, Rfl: 1 .  UNABLE TO FIND, OneTouch Delica Lancets 30 gauge, Disp: , Rfl:   Review of Systems  Constitutional: Negative.   Respiratory: Negative.   Cardiovascular: Negative.   Gastrointestinal: Positive for diarrhea (occasionally after breakfast). Negative for abdominal pain, anal bleeding, blood in stool, nausea, rectal pain and vomiting.  Genitourinary: Negative.   Musculoskeletal: Positive for back pain and gait problem.  Neurological: Positive for weakness and numbness.    Social History   Tobacco Use  . Smoking status: Never Smoker  . Smokeless tobacco: Never Used  Substance Use Topics  . Alcohol use: No    Frequency: Never   Objective:   BP 140/80 (BP Location: Left Arm, Patient Position: Sitting, Cuff Size: Large)   Pulse 68   Temp 98.1 F (36.7 C) (Oral)   Resp 16   Ht _0  (1.549 m)   Wt 193 lb (87.5 kg)  SpO2 96%   BMI 36.47 kg/m  Vitals:   02/20/18 1332  BP: 140/80  Pulse: 68  Resp: 16  Temp: 98.1 F (36.7 C)  TempSrc: Oral  SpO2: 96%  Weight: 193 lb (87.5 kg)  Height: _0  (1.549 m)     Physical Exam  Constitutional: She appears well-developed and well-nourished. No distress.  Neck: Normal range of motion. Neck supple.  Cardiovascular: Normal rate, regular rhythm and normal heart sounds. Exam reveals no gallop and no friction rub.  No murmur heard. Pulmonary/Chest: Effort normal and breath sounds normal. No  respiratory distress. She has no wheezes. She has no rales.  Abdominal: Soft. Bowel sounds are normal. She exhibits no distension. There is no tenderness.  Musculoskeletal:       Lumbar back: She exhibits decreased range of motion and tenderness.       Back:  Skin: She is not diaphoretic.  Vitals reviewed.  CLINICAL DATA:  Bilateral leg pain, left worse than right.  EXAM: LUMBAR SPINE - COMPLETE 4+ VIEW  COMPARISON:  None.  FINDINGS: There is no evidence of lumbar spine fracture. Alignment is normal. Multilevel osteoarthritic changes, severe at L4-L5 and L5-S1, and mild at the remainder of the lumbosacral spine levels, with disc space narrowing, discogenic endplate sclerosis, small osteophyte formation. Severe posterior facet arthropathy at L4-L5 and L5-S1, moderate posterior facet arthropathy at L3-L4.  Similar moderate in severity osteoarthritic changes at T11-T12.  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   Electronically Signed   By: Fidela Salisbury M.D.   On: 07/13/2017 12:52    Assessment & Plan:     1. Chronic bilateral low back pain with bilateral sciatica Worsening. Will slowly taper gabapentin as noted on AVS patient instructions. Will start tramadol as below. Continue back exercises. Referral placed to orthopedics for further evaluation and consideration of treatment options.  - Ambulatory referral to Orthopedic Surgery - traMADol (ULTRAM) 50 MG tablet; Take 1 tablet (50 mg total) by mouth every 8 (eight) hours as needed.  Dispense: 30 tablet; Refill: 0  2. Gastroesophageal reflux disease with esophagitis Improving on Omeprazole. Continue x 3 months and may attempt to discontinue.        Mar Daring, PA-C  [facility]

## 2018-02-20 NOTE — Patient Instructions (Signed)
Decrease gabapentin to 100mg  three times daily x 3 days, then decrease to gabapentin 100mg  twice daily x 3 days, then gabapentin 100mg  once daily x 3 days then discontinue the medication

## 2018-02-27 ENCOUNTER — Ambulatory Visit: Payer: Medicare Other | Admitting: Physician Assistant

## 2018-03-07 ENCOUNTER — Other Ambulatory Visit: Payer: Self-pay | Admitting: Physician Assistant

## 2018-03-07 DIAGNOSIS — G8929 Other chronic pain: Secondary | ICD-10-CM

## 2018-03-07 DIAGNOSIS — M5442 Lumbago with sciatica, left side: Principal | ICD-10-CM

## 2018-03-13 ENCOUNTER — Other Ambulatory Visit: Payer: Self-pay | Admitting: Physician Assistant

## 2018-03-13 DIAGNOSIS — E119 Type 2 diabetes mellitus without complications: Secondary | ICD-10-CM

## 2018-04-01 ENCOUNTER — Telehealth: Payer: Self-pay | Admitting: Physician Assistant

## 2018-04-01 DIAGNOSIS — E1140 Type 2 diabetes mellitus with diabetic neuropathy, unspecified: Secondary | ICD-10-CM

## 2018-04-01 MED ORDER — GABAPENTIN 300 MG PO CAPS: 300.0000 mg | ORAL_CAPSULE | Freq: Three times a day (TID) | ORAL | 1 refills | Status: DC

## 2018-04-01 NOTE — Telephone Encounter (Signed)
Needs a new refill on  Gabapentin 100 mg   She is taking 2 pills 3 times a day.  She wants to know if you will up the 100 mg to 300 mg.  She states the 100mg  is not helping   Pt's CB# [phone]  She uses [facility] and [facility]  She ask that the Omeprazole be discontinued because it causes diarrhea  Thanks  teri

## 2018-04-01 NOTE — Telephone Encounter (Signed)
Please Review

## 2018-04-01 NOTE — Telephone Encounter (Signed)
Omeprazole d/c and gabapentin 300mg  sent in

## 2018-04-01 NOTE — Telephone Encounter (Signed)
Patient advised.

## 2018-05-29 ENCOUNTER — Other Ambulatory Visit: Payer: Self-pay | Admitting: Physician Assistant

## 2018-05-29 DIAGNOSIS — I10 Essential (primary) hypertension: Secondary | ICD-10-CM

## 2018-05-29 DIAGNOSIS — E7800 Pure hypercholesterolemia, unspecified: Secondary | ICD-10-CM

## 2018-06-05 ENCOUNTER — Other Ambulatory Visit: Payer: Self-pay | Admitting: Physician Assistant

## 2018-06-05 DIAGNOSIS — E1140 Type 2 diabetes mellitus with diabetic neuropathy, unspecified: Secondary | ICD-10-CM

## 2018-06-05 MED ORDER — GABAPENTIN 300 MG PO CAPS
300.0000 mg | ORAL_CAPSULE | Freq: Three times a day (TID) | ORAL | 1 refills | Status: DC
Start: 2018-06-05 — End: 2018-07-29

## 2018-06-05 NOTE — Telephone Encounter (Signed)
[facility] for the following medications:  gabapentin (NEURONTIN) 300 MG capsule  Qty: 90  Original date written: 06/05/2018 (not sure if this is a typo?)  Last date filled: 05/07/2018  Please advise.

## 2018-07-08 ENCOUNTER — Other Ambulatory Visit: Payer: Self-pay | Admitting: Physician Assistant

## 2018-07-08 DIAGNOSIS — G8929 Other chronic pain: Secondary | ICD-10-CM

## 2018-07-08 DIAGNOSIS — M5442 Lumbago with sciatica, left side: Principal | ICD-10-CM

## 2018-07-10 ENCOUNTER — Telehealth: Payer: Self-pay

## 2018-07-10 NOTE — Telephone Encounter (Signed)
Called pt to schedule an AWV and a man answered the phone and stated that pt was not available at the time and to try back later. Note made.  -MM

## 2018-07-11 NOTE — Telephone Encounter (Signed)
Scheduled AWV and CPE for 08/12/18 @ 1:20 and 2:00 PM. -MM

## 2018-07-18 LAB — HM DIABETES EYE EXAM

## 2018-07-19 ENCOUNTER — Encounter: Payer: Self-pay | Admitting: *Deleted

## 2018-07-29 ENCOUNTER — Other Ambulatory Visit: Payer: Self-pay | Admitting: Physician Assistant

## 2018-07-29 DIAGNOSIS — E1140 Type 2 diabetes mellitus with diabetic neuropathy, unspecified: Secondary | ICD-10-CM

## 2018-08-05 ENCOUNTER — Other Ambulatory Visit: Payer: Self-pay | Admitting: Physician Assistant

## 2018-08-05 DIAGNOSIS — E119 Type 2 diabetes mellitus without complications: Secondary | ICD-10-CM

## 2018-08-12 ENCOUNTER — Encounter: Payer: Self-pay | Admitting: Physician Assistant

## 2018-08-12 ENCOUNTER — Ambulatory Visit (INDEPENDENT_AMBULATORY_CARE_PROVIDER_SITE_OTHER): Payer: Medicare Other | Admitting: Physician Assistant

## 2018-08-12 ENCOUNTER — Ambulatory Visit (INDEPENDENT_AMBULATORY_CARE_PROVIDER_SITE_OTHER): Payer: Medicare Other

## 2018-08-12 VITALS — BP 120/58 | HR 59 | Temp 99.1°F | Ht 61.0 in | Wt 190.4 lb

## 2018-08-12 DIAGNOSIS — M810 Age-related osteoporosis without current pathological fracture: Secondary | ICD-10-CM

## 2018-08-12 DIAGNOSIS — E1169 Type 2 diabetes mellitus with other specified complication: Secondary | ICD-10-CM

## 2018-08-12 DIAGNOSIS — Z1239 Encounter for other screening for malignant neoplasm of breast: Secondary | ICD-10-CM

## 2018-08-12 DIAGNOSIS — E1159 Type 2 diabetes mellitus with other circulatory complications: Secondary | ICD-10-CM | POA: Diagnosis not present

## 2018-08-12 DIAGNOSIS — E7800 Pure hypercholesterolemia, unspecified: Secondary | ICD-10-CM

## 2018-08-12 DIAGNOSIS — M1990 Unspecified osteoarthritis, unspecified site: Secondary | ICD-10-CM

## 2018-08-12 DIAGNOSIS — I10 Essential (primary) hypertension: Secondary | ICD-10-CM

## 2018-08-12 DIAGNOSIS — Z0000 Encounter for general adult medical examination without abnormal findings: Secondary | ICD-10-CM

## 2018-08-12 DIAGNOSIS — E1151 Type 2 diabetes mellitus with diabetic peripheral angiopathy without gangrene: Secondary | ICD-10-CM | POA: Insufficient documentation

## 2018-08-12 DIAGNOSIS — B351 Tinea unguium: Secondary | ICD-10-CM

## 2018-08-12 MED ORDER — GABAPENTIN 400 MG PO CAPS: 400.0000 mg | ORAL_CAPSULE | Freq: Three times a day (TID) | ORAL | 1 refills | Status: DC

## 2018-08-12 NOTE — Patient Instructions (Signed)
Health Maintenance After Age 76 After age 76, you are at a higher risk for certain long-term diseases and infections as well as injuries from falls. Falls are a major cause of broken bones and head injuries in people who are older than age 76. Getting regular preventive care can help to keep you healthy and well. Preventive care includes getting regular testing and making lifestyle changes as recommended by your health care provider. Talk with your health care provider about:  Which screenings and tests you should have. A screening is a test that checks for a disease when you have no symptoms.  A diet and exercise plan that is right for you. What should I know about screenings and tests to prevent falls? Screening and testing are the best ways to find a health problem early. Early diagnosis and treatment give you the best chance of managing medical conditions that are common after age 76. Certain conditions and lifestyle choices may make you more likely to have a fall. Your health care provider may recommend:  Regular vision checks. Poor vision and conditions such as cataracts can make you more likely to have a fall. If you wear glasses, make sure to get your prescription updated if your vision changes.  Medicine review. Work with your health care provider to regularly review all of the medicines you are taking, including over-the-counter medicines. Ask your health care provider about any side effects that may make you more likely to have a fall. Tell your health care provider if any medicines that you take make you feel dizzy or sleepy.  Osteoporosis screening. Osteoporosis is a condition that causes the bones to get weaker. This can make the bones weak and cause them to break more easily.  Blood pressure screening. Blood pressure changes and medicines to control blood pressure can make you feel dizzy.  Strength and balance checks. Your health care provider may recommend certain tests to check your  strength and balance while standing, walking, or changing positions.  Foot health exam. Foot pain and numbness, as well as not wearing proper footwear, can make you more likely to have a fall.  Depression screening. You may be more likely to have a fall if you have a fear of falling, feel emotionally low, or feel unable to do activities that you used to do.  Alcohol use screening. Using too much alcohol can affect your balance and may make you more likely to have a fall. What actions can I take to lower my risk of falls? General instructions  Talk with your health care provider about your risks for falling. Tell your health care provider if: ? You fall. Be sure to tell your health care provider about all falls, even ones that seem minor. ? You feel dizzy, sleepy, or off-balance.  Take over-the-counter and prescription medicines only as told by your health care provider. These include any supplements.  Eat a healthy diet and maintain a healthy weight. A healthy diet includes low-fat dairy products, low-fat (lean) meats, and fiber from whole grains, beans, and lots of fruits and vegetables. Home safety  Remove any tripping hazards, such as rugs, cords, and clutter.  Install safety equipment such as grab bars in bathrooms and safety rails on stairs.  Keep rooms and walkways well-lit. Activity   Follow a regular exercise program to stay fit. This will help you maintain your balance. Ask your health care provider what types of exercise are appropriate for you.  If you need a cane or   walker, use it as recommended by your health care provider.  Wear supportive shoes that have nonskid soles. Lifestyle  Do not drink alcohol if your health care provider tells you not to drink.  If you drink alcohol, limit how much you have: ? 0-1 drink a day for women. ? 0-2 drinks a day for men.  Be aware of how much alcohol is in your drink. In the [place] one drink equals one typical bottle of beer (12  oz), one-half glass of wine (5 oz), or one shot of hard liquor (1 oz).  Do not use any products that contain nicotine or tobacco, such as cigarettes and e-cigarettes. If you need help quitting, ask your health care provider. Summary  Having a healthy lifestyle and getting preventive care can help to protect your health and wellness after age 76.  Screening and testing are the best way to find a health problem early and help you avoid having a fall. Early diagnosis and treatment give you the best chance for managing medical conditions that are more common for people who are older than age 76.  Falls are a major cause of broken bones and head injuries in people who are older than age 76. Take precautions to prevent a fall at home.  Work with your health care provider to learn what changes you can make to improve your health and wellness and to prevent falls. This information is not intended to replace advice given to you by your health care provider. Make sure you discuss any questions you have with your health care provider. Document Released: 05/02/2017 Document Revised: 05/02/2017 Document Reviewed: 05/02/2017 Elsevier Interactive Patient Education  2019 Elsevier Inc.  

## 2018-08-12 NOTE — Progress Notes (Signed)
Patient: Kathleen Tran, Female    DOB: 1941/02/10, 78 y.o.   MRN: [id number] Visit Date: 08/12/2018  Today's Provider: Mar Daring, PA-C   Chief Complaint  Patient presents with  . Annual Exam   Subjective:    AWE w/ [place] was today 08/12/2018.  Complete Physical Kathleen Tran is a 78 y.o. female. She feels well. She reports exercising walking (when able to). She reports she is sleeping well. No complaints today.  -----------------------------------------------------------   Review of Systems  Constitutional: Negative.   HENT: Positive for nosebleeds and sneezing.   Eyes: Positive for photophobia, itching and visual disturbance.  Respiratory: Negative.   Cardiovascular: Negative.   Gastrointestinal: Positive for abdominal pain, constipation and diarrhea.  Endocrine: Positive for cold intolerance and heat intolerance.  Genitourinary: Positive for decreased urine volume and difficulty urinating.  Musculoskeletal: Positive for arthralgias, back pain, myalgias, neck pain and neck stiffness.  Skin: Negative.   Allergic/Immunologic: Positive for food allergies.  Neurological: Positive for dizziness and numbness.  Hematological: Negative.   Psychiatric/Behavioral: Positive for agitation.  Chronic issues.  Social History   Socioeconomic History  . Marital status: Married    Spouse name: Not on file  . Number of children: 6  . Years of education: Not on file  . Highest education level: GED or equivalent  Occupational History  . Occupation: retired  Scientific laboratory technician  . Financial resource strain: Not hard at all  . Food insecurity:    Worry: Never true    Inability: Never true  . Transportation needs:    Medical: No    Non-medical: No  Tobacco Use  . Smoking status: Never Smoker  . Smokeless tobacco: Never Used  Substance and Sexual Activity  . Alcohol use: No    Frequency: Never  . Drug use: No  . Sexual activity: Not on file  Lifestyle  . Physical  activity:    Days per week: 0 days    Minutes per session: 0 min  . Stress: Not at all  Relationships  . Social connections:    Talks on phone: Patient refused    Gets together: Patient refused    Attends religious service: Patient refused    Active member of club or organization: Patient refused    Attends meetings of clubs or organizations: Patient refused    Relationship status: Patient refused  . Intimate partner violence:    Fear of current or ex partner: No    Emotionally abused: No    Physically abused: No    Forced sexual activity: No  Other Topics Concern  . Not on file  Social History Narrative  . Not on file    Past Medical History:  Diagnosis Date  . Arthritis   . Diabetes mellitus without complication ([place])   . GERD (gastroesophageal reflux disease)   . Hyperlipidemia      Patient Active Problem List   Diagnosis Date Noted  . GERD (gastroesophageal reflux disease) 02/20/2018  . Positive colorectal cancer screening using Cologuard test   . Benign neoplasm of cecum   . Benign neoplasm of ascending colon   . Benign neoplasm of descending colon   . External hemorrhoids   . Arthritis 04/12/2015  . Type 2 diabetes mellitus without complication, without long-term current use of insulin ([place]) 04/12/2015  . Osteoporosis 04/12/2015  . Hypercholesterolemia 04/12/2015  . Essential hypertension 04/12/2015    Past Surgical History:  Procedure Laterality Date  . APPENDECTOMY    .  COLONOSCOPY WITH PROPOFOL N/A 09/26/2017   Procedure: COLONOSCOPY WITH PROPOFOL;  Surgeon: Virgel Manifold, MD;  Location: [facility] ENDOSCOPY;  Service: Endoscopy;  Laterality: N/A;  . PARTIAL HIP ARTHROPLASTY Right   . TONSILLECTOMY      Her family history is not on file.   Current Outpatient Medications:  .  aspirin 81 MG tablet, Take 81 mg by mouth daily., Disp: , Rfl:  .  Blood Glucose Monitoring Suppl (ONETOUCH VERIO IQ SYSTEM) W/DEVICE KIT, Check blood sugar a.c.h.s., Disp: 1  kit, Rfl: 0 .  Calcium Carbonate-Vitamin D 600-125 MG-UNIT TABS, Take by mouth daily., Disp: , Rfl:  .  gabapentin (NEURONTIN) 300 MG capsule, TAKE 1 CAPSULE(300 MG) BY MOUTH THREE TIMES DAILY, Disp: 90 capsule, Rfl: 1 .  glipiZIDE (GLUCOTROL) 5 MG tablet, TAKE ONE-HALF TABLET BY MOUTH EVERY DAY BEFORE SUPPER, Disp: 90 tablet, Rfl: 0 .  losartan (COZAAR) 50 MG tablet, TAKE 1 TABLET(50 MG) BY MOUTH DAILY, Disp: 90 tablet, Rfl: 1 .  metFORMIN (GLUCOPHAGE) 1000 MG tablet, TAKE 1 TABLET(1000 MG) BY MOUTH TWICE DAILY WITH A MEAL, Disp: 180 tablet, Rfl: 1 .  Multiple Vitamin (MULTI VITAMIN DAILY PO), Take 1 tablet by mouth daily., Disp: , Rfl:  .  Omega-3 Fatty Acids (FISH OIL) 1200 MG CAPS, Take 1 capsule by mouth 2 (two) times daily., Disp: , Rfl:  .  ONETOUCH DELICA LANCETS FINE MISC, USE TO CHECK BLOOD SUGAR ONCE TO TWICE DAILY, Disp: 100 each, Rfl: 5 .  ONETOUCH VERIO test strip, USE TO CHECK BLOOD SUGAR EVERY DAY, Disp: 100 each, Rfl: 1 .  pravastatin (PRAVACHOL) 80 MG tablet, TAKE 1 TABLET(80 MG) BY MOUTH DAILY, Disp: 90 tablet, Rfl: 1 .  traMADol (ULTRAM) 50 MG tablet, Take 1 tablet (50 mg total) by mouth every 8 (eight) hours as needed., Disp: 30 tablet, Rfl: 0 .  UNABLE TO FIND, OneTouch Delica Lancets 30 gauge, Disp: , Rfl:   Patient Care Team: Rubye Beach as PCP - General (Physician Assistant) Earnestine Leys, MD as Consulting Physician (Specialist) Lovell Sheehan, MD as Consulting Physician (Orthopedic Surgery) [place], [place] (Optometry) Gardiner Barefoot, DPM as Consulting Physician (Podiatry)     Objective:    Vitals: BP (!) 120/58 (BP Location: Right Arm, Patient Position: Sitting, Cuff Size: Normal)   Pulse (!) 59   Temp 99.1 F (37.3 C) (Oral)   Ht _0  (1.549 m)   Wt 190 lb 6.4 oz (86.4 kg)   BMI 35.98 kg/m   Physical Exam Vitals signs reviewed.  Constitutional:      General: She is not in acute distress.    Appearance: She is well-developed. She  is obese. She is not diaphoretic.  HENT:     Head: Normocephalic and atraumatic.     Right Ear: External ear normal.     Left Ear: External ear normal.     Nose: Nose normal.     Mouth/Throat:     Mouth: Mucous membranes are moist.     Pharynx: No oropharyngeal exudate or posterior oropharyngeal erythema.  Eyes:     General: No scleral icterus.       Right eye: No discharge.        Left eye: No discharge.     Conjunctiva/sclera: Conjunctivae normal.     Pupils: Pupils are equal, round, and reactive to light.  Neck:     Musculoskeletal: Normal range of motion and neck supple.     Thyroid: No thyromegaly.  Vascular: No JVD.     Trachea: No tracheal deviation.  Cardiovascular:     Rate and Rhythm: Normal rate and regular rhythm.     Heart sounds: Normal heart sounds. No murmur. No friction rub. No gallop.   Pulmonary:     Effort: Pulmonary effort is normal. No respiratory distress.     Breath sounds: Normal breath sounds. No wheezing or rales.  Chest:     Chest wall: No tenderness.  Abdominal:     General: Bowel sounds are normal. There is no distension.     Palpations: Abdomen is soft. There is no mass.     Tenderness: There is no abdominal tenderness. There is no guarding or rebound.  Musculoskeletal: Normal range of motion.        General: No tenderness.     Right lower leg: Edema (1-2+ pitting) present.     Left lower leg: Edema (1-2+ pitting) present.  Lymphadenopathy:     Cervical: No cervical adenopathy.  Skin:    General: Skin is warm and dry.     Findings: No rash.  Neurological:     Mental Status: She is alert and oriented to person, place, and time.  Psychiatric:        Behavior: Behavior normal.        Thought Content: Thought content normal.        Judgment: Judgment normal.     Activities of Daily Living In your present state of health, do you have any difficulty performing the following activities: 08/12/2018  Hearing? N  Vision? Y  Comment  Scheduled for cataract sx on left eye 09/11/18.  Difficulty concentrating or making decisions? N  Walking or climbing stairs? Y  Comment Due to lower leg pains.   Dressing or bathing? N  Doing errands, shopping? N  Preparing Food and eating ? N  Using the Toilet? N  In the past six months, have you accidently leaked urine? N  Do you have problems with loss of bowel control? N  Managing your Medications? N  Managing your Finances? N  Housekeeping or managing your Housekeeping? N  Some recent data might be hidden    Fall Risk Assessment Fall Risk  08/12/2018 07/13/2017 07/13/2016  Falls in the past year? 0 No No     Depression Screen PHQ 2/9 Scores 08/12/2018 08/12/2018 07/13/2017 07/13/2016  PHQ - 2 Score 0 0 0 0  PHQ- 9 Score 1 - - -    No flowsheet data found.     Assessment & Plan:    Annual Physical Reviewed patient's Family Medical History Reviewed and updated list of patient's medical providers Assessment of cognitive impairment was done Assessed patient's functional ability Established a written schedule for health screening [place] Completed and Reviewed  Exercise Activities and Dietary recommendations Goals    . DIET - REDUCE SUGAR INTAKE     Recommend cutting out sweets in daily diet. Pt to avoid eating desserts to help aid in weight loss and help diabetes.   08/12/18: Continue watching sugar intake and try switching to sugar free yogurts to substitute for sweets.        Immunization History  Administered Date(s) Administered  . Pneumococcal Conjugate-13 05/19/2013  . Pneumococcal Polysaccharide-23 01/05/2006, 05/03/2009  . Td 08/19/2009  . Tdap 08/19/2009  . Zoster 08/19/2009    Health Maintenance  Topic Date Due  . HEMOGLOBIN A1C  01/10/2018  . FOOT EXAM  07/13/2018  . INFLUENZA VACCINE  10/01/2018 (  Originally 01/31/2018)  . OPHTHALMOLOGY EXAM  07/19/2019  . TETANUS/TDAP  08/20/2019  . DEXA SCAN  12/17/2019  . Fecal DNA  (Cologuard)  08/08/2020  . PNA vac Low Risk Adult  Completed     Discussed health benefits of physical activity, and encouraged her to engage in regular exercise appropriate for her age and condition.    1. Annual physical exam Normal physical exam today. Will check labs as below and f/u pending lab results. If labs are stable and WNL she will not need to have these rechecked for one year at her next annual physical exam. She is to call the office in the meantime if she has any acute issue, questions or concerns.  2. Breast cancer screening Patient declines.   3. Type 2 diabetes mellitus with vascular disease ([place]) Has been stable. Continue Glipizide 22m (1/2 tab daily), metformin 100100mBID. Will check labs as below and f/u pending results.  - CBC with Differential/Platelet - Comprehensive metabolic panel - Hemoglobin A1c - Lipid panel - TSH - Ambulatory referral to Podiatry  4. Essential hypertension Stable. Continue losartan 5041mWill check labs as below and f/u pending results. - CBC with Differential/Platelet - Comprehensive metabolic panel - Hemoglobin A1c - Lipid panel - TSH  5. Age-related osteoporosis without current pathological fracture Due for repeat BMD. Last done in 2016.  - DG Bone Density; Future  6. Hypercholesterolemia Stable. Continue pravastatin 34m59mill check labs as below and f/u pending results. - CBC with Differential/Platelet - Comprehensive metabolic panel - Hemoglobin A1c - Lipid panel - TSH  7. Onychomycosis of multiple toenails with type 2 diabetes mellitus and peripheral angiopathy ([facility]) Thick, yellow toe nails. Requesting referral for diabetic nail care. Son sees Dr. ClinCleda Mccreedy she would like to be referred there. Denies pain currently.  - Ambulatory referral to Podiatry  8. Arthritis Worsening. Increase gabapentin to 400mg42m from 300mg 3m  - gabapentin (NEURONTIN) 400 MG capsule; Take 1 capsule (400 mg total) by mouth 3 (three)  times daily.  Dispense: 90 capsule; Refill: 1  ------------------------------------------------------------------------------------------------------------    JennifMar Daring  [facility]

## 2018-08-12 NOTE — Progress Notes (Signed)
Subjective:   Kathleen Tran is a 78 y.o. female who presents for Medicare Annual (Subsequent) preventive examination.  Review of Systems:  N/A Cardiac Risk Factors include: advanced age (>29mn, >>76women);diabetes mellitus;hypertension;obesity (BMI >30kg/m2);dyslipidemia     Objective:     Vitals: BP (!) 120/58 (BP Location: Right Arm)   Pulse (!) 59   Temp 99.1 F (37.3 C) (Oral)   Ht '5\' 1"'$  (1.549 m)   Wt 190 lb 6.4 oz (86.4 kg)   BMI 35.98 kg/m   Body mass index is 35.98 kg/m.  Advanced Directives 08/12/2018 09/26/2017 08/01/2017 07/13/2017 01/10/2017 04/12/2015  Does Patient Have a Medical Advance Directive? Yes No Yes Yes Yes Yes  Type of [place] - - Living will - Living will  Copy of [place]coin Chart? No - copy requested - - - - No - copy requested    Tobacco Social History   Tobacco Use  Smoking Status Never Smoker  Smokeless Tobacco Never Used     Counseling given: Not Answered   Clinical Intake:  Pre-visit preparation completed: Yes  Pain : No/denies pain Pain Score: 0-No pain    Diabetes:  Is the patient diabetic?  Yes type 2 If diabetic, was a CBG obtained today?  No  Did the patient bring in their glucometer from home?  No  How often do you monitor your CBG's? Once daily.   Financial Strains and Diabetes Management:  Are you having any financial strains with the device, your supplies or your medication? No .  Does the patient want to be seen by Chronic Care Management for management of their diabetes?  No  Would the patient like to be referred to a Nutritionist or for Diabetic Management?  No   Diabetic Exams:  Diabetic Eye Exam: Completed 07/18/18.   Diabetic Foot Exam: Completed 07/13/17. Pt has been advised about the importance in completing this exam. Note made to f/u on this at todays CPE.   Nutritional Status: BMI > 30  Obese Nutritional Risks: None   How often  do you need to have someone help you when you read instructions, pamphlets, or other written materials from your doctor or pharmacy?: 1 - Never  Interpreter Needed?: No  [facility]  Past Medical History:  Diagnosis Date  . Arthritis   . Diabetes mellitus without complication [place]   . GERD (gastroesophageal reflux disease)   . Hyperlipidemia    Past Surgical History:  Procedure Laterality Date  . APPENDECTOMY    . COLONOSCOPY WITH PROPOFOL N/A 09/26/2017   Procedure: COLONOSCOPY WITH PROPOFOL;  Surgeon: TVirgel Manifold MD;  Location: [facility] ENDOSCOPY;  Service: Endoscopy;  Laterality: N/A;  . PARTIAL HIP ARTHROPLASTY Right   . TONSILLECTOMY     History reviewed. No pertinent family history. Social History   Socioeconomic History  . Marital status: Married    Spouse name: Not on file  . Number of children: 6  . Years of education: Not on file  . Highest education level: GED or equivalent  Occupational History  . Occupation: retired  SScientific laboratory technician . Financial resource strain: Not hard at all  . Food insecurity:    Worry: Never true    Inability: Never true  . Transportation needs:    Medical: No    Non-medical: No  Tobacco Use  . Smoking status: Never Smoker  . Smokeless tobacco: Never Used  Substance and Sexual Activity  .  Alcohol use: No    Frequency: Never  . Drug use: No  . Sexual activity: Not on file  Lifestyle  . Physical activity:    Days per week: 0 days    Minutes per session: 0 min  . Stress: Not at all  Relationships  . Social connections:    Talks on phone: Patient refused    Gets together: Patient refused    Attends religious service: Patient refused    Active member of club or organization: Patient refused    Attends meetings of clubs or organizations: Patient refused    Relationship status: Patient refused  Other Topics Concern  . Not on file  Social History Narrative  . Not on file    Outpatient  Encounter Medications as of 08/12/2018  Medication Sig  . aspirin 81 MG tablet Take 81 mg by mouth daily.  . Blood Glucose Monitoring Suppl (ONETOUCH VERIO IQ SYSTEM) W/DEVICE KIT Check blood sugar a.c.h.s.  . Calcium Carbonate-Vitamin D 600-125 MG-UNIT TABS Take by mouth daily.  Marland Kitchen gabapentin (NEURONTIN) 300 MG capsule TAKE 1 CAPSULE(300 MG) BY MOUTH THREE TIMES DAILY  . glipiZIDE (GLUCOTROL) 5 MG tablet TAKE ONE-HALF TABLET BY MOUTH EVERY DAY BEFORE SUPPER  . losartan (COZAAR) 50 MG tablet TAKE 1 TABLET(50 MG) BY MOUTH DAILY  . metFORMIN (GLUCOPHAGE) 1000 MG tablet TAKE 1 TABLET(1000 MG) BY MOUTH TWICE DAILY WITH A MEAL  . Multiple Vitamin (MULTI VITAMIN DAILY PO) Take 1 tablet by mouth daily.  . Omega-3 Fatty Acids (FISH OIL) 1200 MG CAPS Take 1 capsule by mouth 2 (two) times daily.  [name] DELICA LANCETS FINE MISC USE TO CHECK BLOOD SUGAR ONCE TO TWICE DAILY  . ONETOUCH VERIO test strip USE TO CHECK BLOOD SUGAR EVERY DAY  . pravastatin (PRAVACHOL) 80 MG tablet TAKE 1 TABLET(80 MG) BY MOUTH DAILY  . traMADol (ULTRAM) 50 MG tablet Take 1 tablet (50 mg total) by mouth every 8 (eight) hours as needed. (Patient not taking: Reported on 08/12/2018)  . UNABLE TO FIND OneTouch Delica Lancets 30 gauge   No facility-administered encounter medications on file as of 08/12/2018.     Activities of Daily Living In your present state of health, do you have any difficulty performing the following activities: 08/12/2018  Hearing? N  Vision? Y  Comment Scheduled for cataract sx on left eye 09/11/18.  Difficulty concentrating or making decisions? N  Walking or climbing stairs? Y  Comment Due to lower leg pains.   Dressing or bathing? N  Doing errands, shopping? N  Preparing Food and eating ? N  Using the Toilet? N  In the past six months, have you accidently leaked urine? N  Do you have problems with loss of bowel control? N  Managing your Medications? N  Managing your Finances? N  Housekeeping or  managing your Housekeeping? N  Some recent data might be hidden    Patient Care Team: Rubye Beach as PCP - General (Physician Assistant) Earnestine Leys, MD as Consulting Physician (Specialist) Lovell Sheehan, MD as Consulting Physician (Orthopedic Surgery) [place], [place] (Optometry) Gardiner Barefoot, DPM as Consulting Physician (Podiatry)    Assessment:   This is a routine wellness examination for Kathleen Tran.  Exercise Activities and Dietary recommendations Current Exercise Habits: Home exercise routine, Type of exercise: stretching, Time (Minutes): 30, Frequency (Times/Week): 7, Weekly Exercise (Minutes/Week): 210, Intensity: Mild, Exercise limited by: None identified  Goals    . DIET - REDUCE SUGAR INTAKE  Recommend cutting out sweets in daily diet. Pt to avoid eating desserts to help aid in weight loss and help diabetes.   08/12/18: Continue watching sugar intake and try switching to sugar free yogurts to substitute for sweets.        Fall Risk Fall Risk  08/12/2018 07/13/2017 07/13/2016  Falls in the past year? 0 No No   FALL RISK PREVENTION PERTAINING TO THE HOME:  Any stairs in or around the home WITH handrails? Yes  Home free of loose throw rugs in walkways, pet beds, electrical cords, etc? Yes  Adequate lighting in your home to reduce risk of falls? Yes   ASSISTIVE DEVICES UTILIZED TO PREVENT FALLS:  Life alert? No  Use of a cane, walker or w/c? Yes  Grab bars in the bathroom? Yes  Shower chair or bench in shower? No  Elevated toilet seat or a handicapped toilet? No    TIMED UP AND GO:  Was the test performed? No .    Depression Screen PHQ 2/9 Scores 08/12/2018 08/12/2018 07/13/2017 07/13/2016  PHQ - 2 Score 0 0 0 0  PHQ- 9 Score 1 - - -     Cognitive Function: Declined today.         Immunization History  Administered Date(s) Administered  . Pneumococcal Conjugate-13 05/19/2013  . Pneumococcal Polysaccharide-23 01/05/2006, 05/03/2009   . Td 08/19/2009  . Tdap 08/19/2009  . Zoster 08/19/2009    Qualifies for Shingles Vaccine? Yes  Zostavax completed 08/19/09. Due for Shingrix. Education has been provided regarding the importance of this vaccine. Pt has been advised to call insurance company to determine out of pocket expense. Advised may also receive vaccine at local pharmacy or Health Dept. Verbalized acceptance and understanding.  Tdap: Up to date  Flu Vaccine: Due for Flu vaccine. Does the patient want to receive this vaccine today?  No . Education has been provided regarding the importance of this vaccine but still declined. Advised may receive this vaccine at local pharmacy or Health Dept. Aware to provide a copy of the vaccination record if obtained from local pharmacy or Health Dept. Verbalized acceptance and understanding.  Pneumococcal Vaccine: Up to date   Screening Tests Health Maintenance  Topic Date Due  . HEMOGLOBIN A1C  01/10/2018  . FOOT EXAM  07/13/2018  . INFLUENZA VACCINE  10/01/2018 (Originally 01/31/2018)  . OPHTHALMOLOGY EXAM  07/19/2019  . TETANUS/TDAP  08/20/2019  . DEXA SCAN  12/17/2019  . Fecal DNA (Cologuard)  08/08/2020  . PNA vac Low Risk Adult  Completed    Cancer Screenings:  Colorectal Screening: Cologuard completed 09/05/17 . Repeat every 3 years.  Mammogram: No longer required.   Bone Density: Completed 12/17/14. Results reflect OSTEOPENIA. Repeat every 5 years.    Lung Cancer Screening: (Low Dose CT Chest recommended if Age 3-80 years, 30 pack-year currently smoking OR have quit w/in 15years.) does not qualify.    Additional Screening:  Vision Screening: Recommended annual ophthalmology exams for early detection of glaucoma and other disorders of the eye.  Dental Screening: Recommended annual dental exams for proper oral hygiene  Community Resource Referral:  CRR required this visit?  No       Plan:  I have personally reviewed and addressed the Medicare Annual  Wellness questionnaire and have noted the following in the patient's chart:  A. Medical and social history B. Use of alcohol, tobacco or illicit drugs  C. Current medications and supplements D. Functional ability and status E.  Nutritional status  F.  Physical activity G. Advance directives H. List of other physicians I.  Hospitalizations, surgeries, and ER visits in previous 12 months J.  [place] such as hearing and vision if needed, cognitive and depression L. Referrals and appointments - none  In addition, I have reviewed and discussed with patient certain preventive protocols, quality metrics, and best practice recommendations. A written personalized care plan for preventive services as well as general preventive health recommendations were provided to patient.  See attached scanned questionnaire for additional information.   Signed,  Fabio Neighbors, LPN Nurse Health Advisor   Nurse Recommendations: Pt needs a diabetic foot exam and Hgb A1c checked today. Pt declined the influenza vaccine.

## 2018-08-12 NOTE — Patient Instructions (Signed)
Kathleen Tran , Thank you for taking time to come for your Medicare Wellness Visit. I appreciate your ongoing commitment to your health goals. Please review the following plan we discussed and let me know if I can assist you in the future.   Screening recommendations/referrals: Colonoscopy: cologuard up to date, due 09/05/20 Mammogram: Up to date, due 09/2018 Bone Density: Up to date, due 12/2019 Recommended yearly ophthalmology/optometry visit for glaucoma screening and checkup Recommended yearly dental visit for hygiene and checkup  Vaccinations: Influenza vaccine: Pt declines today.  Pneumococcal vaccine: Completed series Tdap vaccine: Up to date, due 08/2019 Shingles vaccine: Pt declines today.     Advanced directives: Please bring a copy of your POA (Power of Attorney) and/or Living Will to your next appointment.   Conditions/risks identified: Obesity- continue watching sugar intake and try switching to sugar free yogurts to substitute for sweets.   Next appointment: 2:00 PM today with Kathleen Tran.    Preventive Care 37 Years and Older, Female Preventive care refers to lifestyle choices and visits with your health care provider that can promote health and wellness. What does preventive care include?  A yearly physical exam. This is also called an annual well check.  Dental exams once or twice a year.  Routine eye exams. Ask your health care provider how often you should have your eyes checked.  Personal lifestyle choices, including:  Daily care of your teeth and gums.  Regular physical activity.  Eating a healthy diet.  Avoiding tobacco and drug use.  Limiting alcohol use.  Practicing safe sex.  Taking low-dose aspirin every day.  Taking vitamin and mineral supplements as recommended by your health care provider. What happens during an annual well check? The services and screenings done by your health care provider during your annual well check will depend  on your age, overall health, lifestyle risk factors, and family history of disease. Counseling  Your health care provider may ask you questions about your:  Alcohol use.  Tobacco use.  Drug use.  Emotional well-being.  Home and relationship well-being.  Sexual activity.  Eating habits.  History of falls.  Memory and ability to understand (cognition).  Work and work Statistician.  Reproductive health. Screening  You may have the following tests or measurements:  Height, weight, and BMI.  Blood pressure.  Lipid and cholesterol levels. These may be checked every 5 years, or more frequently if you are over 44 years old.  Skin check.  Lung cancer screening. You may have this screening every year starting at age 34 if you have a 30-pack-year history of smoking and currently smoke or have quit within the past 15 years.  Fecal occult blood test (FOBT) of the stool. You may have this test every year starting at age 46.  Flexible sigmoidoscopy or colonoscopy. You may have a sigmoidoscopy every 5 years or a colonoscopy every 10 years starting at age 71.  Hepatitis C blood test.  Hepatitis B blood test.  Sexually transmitted disease (STD) testing.  Diabetes screening. This is done by checking your blood sugar (glucose) after you have not eaten for a while (fasting). You may have this done every 1-3 years.  Bone density scan. This is done to screen for osteoporosis. You may have this done starting at age 61.  Mammogram. This may be done every 1-2 years. Talk to your health care provider about how often you should have regular mammograms. Talk with your health care provider about your test results, treatment options,  and if necessary, the need for more tests. Vaccines  Your health care provider may recommend certain vaccines, such as:  Influenza vaccine. This is recommended every year.  Tetanus, diphtheria, and acellular pertussis (Tdap, Td) vaccine. You may need a Td  booster every 10 years.  Zoster vaccine. You may need this after age 66.  Pneumococcal 13-valent conjugate (PCV13) vaccine. One dose is recommended after age 55.  Pneumococcal polysaccharide (PPSV23) vaccine. One dose is recommended after age 13. Talk to your health care provider about which screenings and vaccines you need and how often you need them. This information is not intended to replace advice given to you by your health care provider. Make sure you discuss any questions you have with your health care provider. Document Released: 07/16/2015 Document Revised: 03/08/2016 Document Reviewed: 04/20/2015 Elsevier Interactive Patient Education  2017 [place] Prevention in the Home Falls can cause injuries. They can happen to people of all ages. There are many things you can do to make your home safe and to help prevent falls. What can I do on the outside of my home?  Regularly fix the edges of walkways and driveways and fix any cracks.  Remove anything that might make you trip as you walk through a door, such as a raised step or threshold.  Trim any bushes or trees on the path to your home.  Use bright outdoor lighting.  Clear any walking paths of anything that might make someone trip, such as rocks or tools.  Regularly check to see if handrails are loose or broken. Make sure that both sides of any steps have handrails.  Any raised decks and porches should have guardrails on the edges.  Have any leaves, snow, or ice cleared regularly.  Use sand or salt on walking paths during winter.  Clean up any spills in your garage right away. This includes oil or grease spills. What can I do in the bathroom?  Use night lights.  Install grab bars by the toilet and in the tub and shower. Do not use towel bars as grab bars.  Use non-skid mats or decals in the tub or shower.  If you need to sit down in the shower, use a plastic, non-slip stool.  Keep the floor dry. Clean up  any water that spills on the floor as soon as it happens.  Remove soap buildup in the tub or shower regularly.  Attach bath mats securely with double-sided non-slip rug tape.  Do not have throw rugs and other things on the floor that can make you trip. What can I do in the bedroom?  Use night lights.  Make sure that you have a light by your bed that is easy to reach.  Do not use any sheets or blankets that are too big for your bed. They should not hang down onto the floor.  Have a firm chair that has side arms. You can use this for support while you get dressed.  Do not have throw rugs and other things on the floor that can make you trip. What can I do in the kitchen?  Clean up any spills right away.  Avoid walking on wet floors.  Keep items that you use a lot in easy-to-reach places.  If you need to reach something above you, use a strong step stool that has a grab bar.  Keep electrical cords out of the way.  Do not use floor polish or wax that makes floors slippery. If  you must use wax, use non-skid floor wax.  Do not have throw rugs and other things on the floor that can make you trip. What can I do with my stairs?  Do not leave any items on the stairs.  Make sure that there are handrails on both sides of the stairs and use them. Fix handrails that are broken or loose. Make sure that handrails are as long as the stairways.  Check any carpeting to make sure that it is firmly attached to the stairs. Fix any carpet that is loose or worn.  Avoid having throw rugs at the top or bottom of the stairs. If you do have throw rugs, attach them to the floor with carpet tape.  Make sure that you have a light switch at the top of the stairs and the bottom of the stairs. If you do not have them, ask someone to add them for you. What else can I do to help prevent falls?  Wear shoes that:  Do not have high heels.  Have rubber bottoms.  Are comfortable and fit you well.  Are  closed at the toe. Do not wear sandals.  If you use a stepladder:  Make sure that it is fully opened. Do not climb a closed stepladder.  Make sure that both sides of the stepladder are locked into place.  Ask someone to hold it for you, if possible.  Clearly mark and make sure that you can see:  Any grab bars or handrails.  First and last steps.  Where the edge of each step is.  Use tools that help you move around (mobility aids) if they are needed. These include:  Canes.  Walkers.  Scooters.  Crutches.  Turn on the lights when you go into a dark area. Replace any light bulbs as soon as they burn out.  Set up your furniture so you have a clear path. Avoid moving your furniture around.  If any of your floors are uneven, fix them.  If there are any pets around you, be aware of where they are.  Review your medicines with your doctor. Some medicines can make you feel dizzy. This can increase your chance of falling. Ask your doctor what other things that you can do to help prevent falls. This information is not intended to replace advice given to you by your health care provider. Make sure you discuss any questions you have with your health care provider. Document Released: 04/15/2009 Document Revised: 11/25/2015 Document Reviewed: 07/24/2014 Elsevier Interactive Patient Education  2017 [employer].

## 2018-08-13 LAB — HEMOGLOBIN A1C
Est. average glucose Bld gHb Est-mCnc: 146 mg/dL
Hgb A1c MFr Bld: 6.7 % — ABNORMAL HIGH (ref 4.8–5.6)

## 2018-08-13 LAB — COMPREHENSIVE METABOLIC PANEL
A/G RATIO: 1.7 (ref 1.2–2.2)
ALT: 14 IU/L (ref 0–32)
AST: 19 IU/L (ref 0–40)
Albumin: 4 g/dL (ref 3.7–4.7)
Alkaline Phosphatase: 74 IU/L (ref 39–117)
BUN/Creatinine Ratio: 17 (ref 12–28)
BUN: 10 mg/dL (ref 8–27)
Bilirubin Total: 0.4 mg/dL (ref 0.0–1.2)
CO2: 24 mmol/L (ref 20–29)
Calcium: 10.6 mg/dL — ABNORMAL HIGH (ref 8.7–10.3)
Chloride: 103 mmol/L (ref 96–106)
Creatinine, Ser: 0.6 mg/dL (ref 0.57–1.00)
GFR calc Af Amer: 103 mL/min/{1.73_m2} (ref >59)
GFR calc non Af Amer: 89 mL/min/{1.73_m2} (ref >59)
GLOBULIN, TOTAL: 2.4 g/dL (ref 1.5–4.5)
Glucose: 98 mg/dL (ref 65–99)
POTASSIUM: 4.8 mmol/L (ref 3.5–5.2)
Sodium: 142 mmol/L (ref 134–144)
Total Protein: 6.4 g/dL (ref 6.0–8.5)

## 2018-08-13 LAB — CBC WITH DIFFERENTIAL/PLATELET
BASOS ABS: 0 10*3/uL (ref 0.0–0.2)
Basos: 1 %
EOS (ABSOLUTE): 0.4 10*3/uL (ref 0.0–0.4)
Eos: 5 %
Hematocrit: 39 % (ref 34.0–46.6)
Hemoglobin: 12.5 g/dL (ref 11.1–15.9)
IMMATURE GRANULOCYTES: 0 %
Immature Grans (Abs): 0 10*3/uL (ref 0.0–0.1)
LYMPHS ABS: 1.7 10*3/uL (ref 0.7–3.1)
Lymphs: 23 %
MCH: 30.6 pg (ref 26.6–33.0)
MCHC: 32.1 g/dL (ref 31.5–35.7)
MCV: 96 fL (ref 79–97)
MONOS ABS: 0.6 10*3/uL (ref 0.1–0.9)
Monocytes: 8 %
NEUTROS PCT: 63 %
Neutrophils Absolute: 4.5 10*3/uL (ref 1.4–7.0)
PLATELETS: 335 10*3/uL (ref 150–450)
RBC: 4.08 x10E6/uL (ref 3.77–5.28)
RDW: 13.1 % (ref 11.7–15.4)
WBC: 7.2 10*3/uL (ref 3.4–10.8)

## 2018-08-13 LAB — TSH: TSH: 2.68 u[IU]/mL (ref 0.450–4.500)

## 2018-08-13 LAB — LIPID PANEL
CHOL/HDL RATIO: 2.3 ratio (ref 0.0–4.4)
Cholesterol, Total: 163 mg/dL (ref 100–199)
HDL: 70 mg/dL (ref >39)
LDL CALC: 58 mg/dL (ref 0–99)
Triglycerides: 176 mg/dL — ABNORMAL HIGH (ref 0–149)
VLDL Cholesterol Cal: 35 mg/dL (ref 5–40)

## 2018-08-14 ENCOUNTER — Telehealth: Payer: Self-pay

## 2018-08-14 NOTE — Telephone Encounter (Signed)
No answer

## 2018-08-14 NOTE — Telephone Encounter (Signed)
-----   Message from Mar Daring, [place] sent at 08/14/2018  7:49 AM EST ----- Blood count is normal. Kidney and liver function are normal. Calcium is borderline high. I would stop all calcium supplements and foods enriched with calcium at this time. We can recheck in 2-4 weeks to make sure returned to baseline. A1c is up slightly from last year but still under goal at 6.7. Continue lifestyle modifications with limiting carbs and sugars in diet and continue all prescribed medications. Cholesterol normal but triglycerides remain elevated. This is part of cholesterol most closely related to dietary habits so try to also limit fatty foods and red meats.

## 2018-08-16 NOTE — Telephone Encounter (Signed)
Patient advised as directed below. Patient is going to call a few days before she comes in for her labs.

## 2018-08-18 ENCOUNTER — Other Ambulatory Visit: Payer: Self-pay | Admitting: Physician Assistant

## 2018-08-18 DIAGNOSIS — E119 Type 2 diabetes mellitus without complications: Secondary | ICD-10-CM

## 2018-08-26 ENCOUNTER — Encounter: Payer: Self-pay | Admitting: *Deleted

## 2018-09-05 ENCOUNTER — Ambulatory Visit: Payer: Self-pay | Admitting: Orthopedic Surgery

## 2018-09-10 ENCOUNTER — Other Ambulatory Visit: Payer: Self-pay | Admitting: Physician Assistant

## 2018-09-10 DIAGNOSIS — E119 Type 2 diabetes mellitus without complications: Secondary | ICD-10-CM

## 2018-09-10 MED ORDER — TRANEXAMIC ACID-NACL 1000-0.7 MG/100ML-% IV SOLN
1000.0000 mg | INTRAVENOUS | Status: DC
  Filled 2018-09-10: qty 100

## 2018-09-10 MED ORDER — CEFAZOLIN SODIUM-DEXTROSE 2-4 GM/100ML-% IV SOLN: 2.0000 g | INTRAVENOUS | Status: DC

## 2018-09-11 ENCOUNTER — Ambulatory Visit: Payer: Medicare Other | Admitting: Certified Registered"

## 2018-09-11 ENCOUNTER — Ambulatory Visit
Admission: RE | Admit: 2018-09-11 | Discharge: 2018-09-11 | Disposition: A | Discharge status: Home / Self Care | Payer: Medicare Other | Attending: Ophthalmology | Admitting: Ophthalmology

## 2018-09-11 ENCOUNTER — Encounter: Payer: Self-pay | Admitting: *Deleted

## 2018-09-11 ENCOUNTER — Other Ambulatory Visit: Payer: Self-pay

## 2018-09-11 ENCOUNTER — Encounter
Admission: RE | Disposition: A | Discharge status: Home / Self Care | Payer: Self-pay | Source: Home / Self Care | Attending: Ophthalmology

## 2018-09-11 DIAGNOSIS — E7800 Pure hypercholesterolemia, unspecified: Secondary | ICD-10-CM | POA: Diagnosis not present

## 2018-09-11 DIAGNOSIS — R251 Tremor, unspecified: Secondary | ICD-10-CM | POA: Insufficient documentation

## 2018-09-11 DIAGNOSIS — H2512 Age-related nuclear cataract, left eye: Secondary | ICD-10-CM | POA: Diagnosis present

## 2018-09-11 DIAGNOSIS — Z96641 Presence of right artificial hip joint: Secondary | ICD-10-CM | POA: Diagnosis not present

## 2018-09-11 DIAGNOSIS — I10 Essential (primary) hypertension: Secondary | ICD-10-CM | POA: Insufficient documentation

## 2018-09-11 DIAGNOSIS — E785 Hyperlipidemia, unspecified: Secondary | ICD-10-CM | POA: Insufficient documentation

## 2018-09-11 DIAGNOSIS — M1990 Unspecified osteoarthritis, unspecified site: Secondary | ICD-10-CM | POA: Insufficient documentation

## 2018-09-11 DIAGNOSIS — E1136 Type 2 diabetes mellitus with diabetic cataract: Secondary | ICD-10-CM | POA: Insufficient documentation

## 2018-09-11 DIAGNOSIS — K219 Gastro-esophageal reflux disease without esophagitis: Secondary | ICD-10-CM | POA: Diagnosis not present

## 2018-09-11 HISTORY — DX: Essential (primary) hypertension: I10

## 2018-09-11 HISTORY — PX: CATARACT EXTRACTION W/PHACO: SHX586

## 2018-09-11 HISTORY — DX: Tremor, unspecified: R25.1

## 2018-09-11 LAB — GLUCOSE, CAPILLARY: Glucose-Capillary: 150 mg/dL — ABNORMAL HIGH (ref 70–99)

## 2018-09-11 SURGERY — PHACOEMULSIFICATION, CATARACT, WITH IOL INSERTION
Anesthesia: Monitor Anesthesia Care | Site: Eye | Laterality: Left

## 2018-09-11 MED ORDER — MOXIFLOXACIN HCL 0.5 % OP SOLN
1.0000 [drp] | OPHTHALMIC | Status: AC
  Administered 2018-09-11: 1 [drp] via OPHTHALMIC

## 2018-09-11 MED ORDER — POVIDONE-IODINE 5 % OP SOLN
OPHTHALMIC | Status: DC | PRN
  Administered 2018-09-11: 1 via OPHTHALMIC

## 2018-09-11 MED ORDER — FENTANYL CITRATE (PF) 100 MCG/2ML IJ SOLN
INTRAMUSCULAR | Status: DC | PRN
  Administered 2018-09-11: 50 ug via INTRAVENOUS

## 2018-09-11 MED ORDER — EPINEPHRINE PF 1 MG/ML IJ SOLN
INTRAMUSCULAR | Status: AC
  Filled 2018-09-11: qty 1

## 2018-09-11 MED ORDER — MOXIFLOXACIN HCL 0.5 % OP SOLN
OPHTHALMIC | Status: AC
  Administered 2018-09-11: 1 [drp] via OPHTHALMIC
  Filled 2018-09-11: qty 6

## 2018-09-11 MED ORDER — SODIUM CHLORIDE 0.9 % IV SOLN
INTRAVENOUS | Status: DC
  Administered 2018-09-11: 10:00:00 via INTRAVENOUS

## 2018-09-11 MED ORDER — POVIDONE-IODINE 5 % OP SOLN
OPHTHALMIC | Status: AC
  Filled 2018-09-11: qty 30

## 2018-09-11 MED ORDER — NEOMYCIN-POLYMYXIN-DEXAMETH 0.1 % OP OINT
TOPICAL_OINTMENT | OPHTHALMIC | Status: DC | PRN
  Administered 2018-09-11: 1 via OPHTHALMIC

## 2018-09-11 MED ORDER — LIDOCAINE HCL (PF) 4 % IJ SOLN
INTRAMUSCULAR | Status: AC
  Filled 2018-09-11: qty 5

## 2018-09-11 MED ORDER — MIDAZOLAM HCL 2 MG/2ML IJ SOLN
INTRAMUSCULAR | Status: AC
  Filled 2018-09-11: qty 2

## 2018-09-11 MED ORDER — NA HYALUR & NA CHOND-NA HYALUR 0.55-0.5 ML IO KIT
PACK | INTRAOCULAR | Status: AC
  Filled 2018-09-11: qty 1.05

## 2018-09-11 MED ORDER — MOXIFLOXACIN HCL 0.5 % OP SOLN
1.0000 [drp] | OPHTHALMIC | Status: DC | PRN
  Administered 2018-09-11 (×2): 1 [drp] via OPHTHALMIC

## 2018-09-11 MED ORDER — EPINEPHRINE PF 1 MG/ML IJ SOLN
INTRAOCULAR | Status: DC | PRN
  Administered 2018-09-11: 1 mL via OPHTHALMIC

## 2018-09-11 MED ORDER — ARMC OPHTHALMIC DILATING DROPS
OPHTHALMIC | Status: AC
  Administered 2018-09-11: 1 via OPHTHALMIC
  Filled 2018-09-11: qty 0.5

## 2018-09-11 MED ORDER — TETRACAINE HCL 0.5 % OP SOLN
1.0000 [drp] | OPHTHALMIC | Status: AC | PRN
  Administered 2018-09-11 (×3): 1 [drp] via OPHTHALMIC

## 2018-09-11 MED ORDER — LIDOCAINE HCL (PF) 4 % IJ SOLN
INTRAOCULAR | Status: DC | PRN
  Administered 2018-09-11: 2 mL via OPHTHALMIC

## 2018-09-11 MED ORDER — MIDAZOLAM HCL 2 MG/2ML IJ SOLN
INTRAMUSCULAR | Status: DC | PRN
  Administered 2018-09-11: 1 mg via INTRAVENOUS

## 2018-09-11 MED ORDER — ARMC OPHTHALMIC DILATING DROPS
1.0000 "application " | OPHTHALMIC | Status: AC
  Administered 2018-09-11 (×3): 1 via OPHTHALMIC

## 2018-09-11 MED ORDER — NEOMYCIN-POLYMYXIN-DEXAMETH 3.5-10000-0.1 OP OINT
TOPICAL_OINTMENT | OPHTHALMIC | Status: AC
  Filled 2018-09-11: qty 3.5

## 2018-09-11 MED ORDER — TETRACAINE HCL 0.5 % OP SOLN
OPHTHALMIC | Status: AC
  Administered 2018-09-11: 1 [drp] via OPHTHALMIC
  Filled 2018-09-11: qty 4

## 2018-09-11 MED ORDER — NA HYALUR & NA CHOND-NA HYALUR 0.4-0.35 ML IO KIT
PACK | INTRAOCULAR | Status: DC | PRN
  Administered 2018-09-11: 1 mL via INTRAOCULAR

## 2018-09-11 MED ORDER — CARBACHOL 0.01 % IO SOLN
INTRAOCULAR | Status: DC | PRN
  Administered 2018-09-11: .5 mL via INTRAOCULAR

## 2018-09-11 MED ORDER — FENTANYL CITRATE (PF) 100 MCG/2ML IJ SOLN
INTRAMUSCULAR | Status: AC
  Filled 2018-09-11: qty 2

## 2018-09-11 SURGICAL SUPPLY — 18 items
GLOVE BIO SURGEON STRL SZ8 (GLOVE) ×2 IMPLANT
GLOVE BIOGEL M 6.5 STRL (GLOVE) ×2 IMPLANT
GLOVE SURG LX 7.5 STRW (GLOVE) ×1
GLOVE SURG LX STRL 7.5 STRW (GLOVE) ×1 IMPLANT
GOWN STRL REUS W/ TWL LRG LVL3 (GOWN DISPOSABLE) ×2 IMPLANT
GOWN STRL REUS W/TWL LRG LVL3 (GOWN DISPOSABLE) ×2
LABEL CATARACT MEDS ST (LABEL) ×2 IMPLANT
LENS IOL TECNIS ITEC 22.5 (Intraocular Lens) ×1 IMPLANT
NDL HPO THNWL 1X22GA REG BVL (NEEDLE) ×1 IMPLANT
NEEDLE SAFETY 22GX1 (NEEDLE) ×1
PACK CATARACT (MISCELLANEOUS) ×2 IMPLANT
PACK CATARACT BRASINGTON LX (MISCELLANEOUS) ×2 IMPLANT
PACK EYE AFTER SURG (MISCELLANEOUS) ×2 IMPLANT
SOL BSS BAG (MISCELLANEOUS) ×2
SOLUTION BSS BAG (MISCELLANEOUS) ×1 IMPLANT
SYR 5ML LL (SYRINGE) ×2 IMPLANT
WATER STERILE IRR 250ML POUR (IV SOLUTION) ×2 IMPLANT
WIPE NON LINTING 3.25X3.25 (MISCELLANEOUS) ×2 IMPLANT

## 2018-09-11 NOTE — Anesthesia Preprocedure Evaluation (Signed)
Anesthesia Evaluation  Patient identified by MRN, date of birth, ID band Patient awake    Reviewed: Allergy & Precautions, H&P , NPO status , Patient's Chart, lab work & pertinent test results  History of Anesthesia Complications Negative for: history of anesthetic complications  Airway Mallampati: III  TM Distance: <3 FB Neck ROM: limited    Dental  (+) Missing, Poor Dentition, Edentulous Upper, Edentulous Lower, Upper Dentures, Lower Dentures   Pulmonary neg pulmonary ROS, neg shortness of breath,           Cardiovascular Exercise Tolerance: Good hypertension, (-) angina(-) Past MI and (-) DOE      Neuro/Psych negative neurological ROS  negative psych ROS   GI/Hepatic Neg liver ROS, GERD  Controlled and Medicated,  Endo/Other  diabetes, Type 2  Renal/GU negative Renal ROS  negative genitourinary   Musculoskeletal  (+) Arthritis ,   Abdominal   Peds  Hematology negative hematology ROS (+)   Anesthesia Other Findings Past Medical History: No date: Arthritis No date: Diabetes mellitus without complication (HCC) No date: GERD (gastroesophageal reflux disease) No date: Hyperlipidemia  Past Surgical History: No date: APPENDECTOMY No date: PARTIAL HIP ARTHROPLASTY; Right No date: TONSILLECTOMY  BMI    Body Mass Index:  36.66 kg/m      Reproductive/Obstetrics negative OB ROS                             Anesthesia Physical  Anesthesia Plan  ASA: III  Anesthesia Plan: MAC   Post-op Pain Management:    Induction: Intravenous  PONV Risk Score and Plan:   Airway Management Planned: Nasal Cannula  Additional Equipment:   Intra-op Plan:   Post-operative Plan:   Informed Consent: I have reviewed the patients History and Physical, chart, labs and discussed the procedure including the risks, benefits and alternatives for the proposed anesthesia with the patient or authorized  representative who has indicated his/her understanding and acceptance.     Dental Advisory Given  Plan Discussed with: Anesthesiologist, CRNA and Surgeon  Anesthesia Plan Comments: (Patient consented for risks of anesthesia including but not limited to:  - adverse reactions to medications - risk of intubation if required - damage to teeth, lips or other oral mucosa - sore throat or hoarseness - Damage to heart, brain, lungs or loss of life  Patient voiced understanding.)        Anesthesia Quick Evaluation

## 2018-09-11 NOTE — Anesthesia Postprocedure Evaluation (Signed)
Anesthesia Post Note  Patient: Dentist  Procedure(s) Performed: CATARACT EXTRACTION PHACO AND INTRAOCULAR LENS PLACEMENT (IOC) LEFT, DIABETIC (Left Eye)  Patient location during evaluation: PACU Anesthesia Type: MAC Level of consciousness: awake and alert and oriented Pain management: pain level controlled Vital Signs Assessment: post-procedure vital signs reviewed and stable Respiratory status: spontaneous breathing Cardiovascular status: blood pressure returned to baseline Anesthetic complications: no     Last Vitals:  Vitals:   09/11/18 0912 09/11/18 1039  BP: 140/64 134/64  Pulse: (!) 57 68  Resp: 16 18  Temp: 36.8 C 36.9 C  SpO2: 98% 100%    Last Pain:  Vitals:   09/11/18 1039  TempSrc: Tympanic  PainSc: 0-No pain                 Akylah Hascall

## 2018-09-11 NOTE — Op Note (Signed)
OPERATIVE NOTE  Kathleen Tran [id number] 09/11/2018   PREOPERATIVE DIAGNOSIS:  Nuclear sclerotic cataract left eye. H25.12   POSTOPERATIVE DIAGNOSIS:    Nuclear sclerotic cataract left eye.     PROCEDURE:  Phacoemusification with posterior chamber intraocular lens placement of the left eye   LENS:   Implant Name Type Inv. Item Serial No. Manufacturer Lot No. LRB No. Used  LENS IOL DIOP 22.5 - [id number] Intraocular Lens LENS IOL DIOP 22.5 [id number] AMO  Left 1        ULTRASOUND TIME: 8 % of 1 minutes, 3 seconds.  CDE 6.9   SURGEON:  Wyonia Hough, MD   ANESTHESIA:  Topical with tetracaine drops and 2% Xylocaine jelly, augmented with 1% preservative-free intracameral lidocaine.    COMPLICATIONS:  None.   DESCRIPTION OF PROCEDURE:  The patient was identified in the holding room and transported to the operating room and placed in the supine position under the operating microscope.  The left eye was identified as the operative eye and it was prepped and draped in the usual sterile ophthalmic fashion.   A 1 millimeter clear-corneal paracentesis was made at the 1:30 position. 0.5 ml of preservative-free 1% lidocaine was injected into the anterior chamber.  The anterior chamber was filled with Viscoat viscoelastic.  A 2.4 millimeter keratome was used to make a near-clear corneal incision at the 10:30 position.  .  A curvilinear capsulorrhexis was made with a cystotome and capsulorrhexis forceps.  Balanced salt solution was used to hydrodissect and hydrodelineate the nucleus.   Phacoemulsification was then used in stop and chop fashion to remove the lens nucleus and epinucleus.  The remaining cortex was then removed using the irrigation and aspiration handpiece. Provisc was then placed into the capsular bag to distend it for lens placement.  A lens was then injected into the capsular bag.  The remaining viscoelastic was aspirated.   Wounds were hydrated with balanced salt  solution.  The anterior chamber was inflated to a physiologic pressure with balanced salt solution. Topical with tetracaine drops and 2% Xylocaine jelly. Vigamox 0.2 ml of a 1mg  per ml solution was injected into the anterior chamber for a dose of 0.2 mg of intracameral antibiotic at the completion of the case.  Miostat was placed into the anterior chamber to constrict the pupil.  No wound leaks were noted.  Topical Vigamox drops and Maxitrol ointment were applied to the eye.  The patient was taken to the recovery room in stable condition without complications of anesthesia or surgery  Laretta Pyatt 09/11/2018, 10:37 AM

## 2018-09-11 NOTE — Transfer of Care (Signed)
Immediate Anesthesia Transfer of Care Note  Patient: Kathleen Tran  Procedure(s) Performed: CATARACT EXTRACTION PHACO AND INTRAOCULAR LENS PLACEMENT (IOC) LEFT, DIABETIC (Left Eye)  Patient Location: PACU  Anesthesia Type:MAC  Level of Consciousness: awake, alert  and oriented  Airway & Oxygen Therapy: Patient Spontanous Breathing  Post-op Assessment: Report given to RN and Post -op Vital signs reviewed and stable  Post vital signs: Reviewed and stable  Last Vitals:  Vitals Value Taken Time  BP    Temp    Pulse    Resp    SpO2      Last Pain:  Vitals:   09/11/18 0912  TempSrc: Oral  PainSc: 0-No pain         Complications: No apparent anesthesia complications

## 2018-09-11 NOTE — H&P (Signed)

## 2018-09-11 NOTE — Discharge Instructions (Signed)
Eye Surgery Discharge Instructions    Expect mild scratchy sensation or mild soreness. DO NOT RUB YOUR EYE!  The day of surgery:  Minimal physical activity, but bed rest is not required  No reading, computer work, or close hand work  No bending, lifting, or straining.  May watch TV  For 24 hours:  No driving, legal decisions, or alcoholic beverages  Safety precautions  Eat anything you prefer: It is better to start with liquids, then soup then solid foods.  _____ Eye patch should be worn until postoperative exam tomorrow.  ____ Solar shield eyeglasses should be worn for comfort in the sunlight/patch while sleeping  Resume all regular medications including aspirin or Coumadin if these were discontinued prior to surgery. You may shower, bathe, shave, or wash your hair. Tylenol may be taken for mild discomfort.  Call your doctor if you experience significant pain, nausea, or vomiting, fever > 101 or other signs of infection. [phone] or [phone] Specific instructions:  Follow-up Information    Leandrew Koyanagi, MD Follow up.   Specialty:  Ophthalmology Why:  3-12 at [address] information: [address]   [place] [address] [phone]

## 2018-09-11 NOTE — Anesthesia Post-op Follow-up Note (Signed)
Anesthesia QCDR form completed.        

## 2018-09-12 ENCOUNTER — Encounter: Payer: Self-pay | Admitting: Ophthalmology

## 2018-10-03 ENCOUNTER — Other Ambulatory Visit: Payer: Medicare Other

## 2018-10-09 ENCOUNTER — Ambulatory Visit: Admit: 2018-10-09 | Payer: Medicare Other | Admitting: Ophthalmology

## 2018-10-09 SURGERY — PHACOEMULSIFICATION, CATARACT, WITH IOL INSERTION
Anesthesia: Choice | Laterality: Right

## 2018-10-16 ENCOUNTER — Other Ambulatory Visit: Payer: Self-pay | Admitting: Physician Assistant

## 2018-10-16 DIAGNOSIS — M1990 Unspecified osteoarthritis, unspecified site: Secondary | ICD-10-CM

## 2018-10-24 ENCOUNTER — Inpatient Hospital Stay: Admission: RE | Admit: 2018-10-24 | Payer: Medicare Other | Source: Ambulatory Visit

## 2018-11-06 ENCOUNTER — Ambulatory Visit: Admit: 2018-11-06 | Payer: Medicare Other | Admitting: Orthopedic Surgery

## 2018-11-06 SURGERY — ARTHROPLASTY, HIP, TOTAL,POSTERIOR APPROACH
Anesthesia: General | Laterality: Left

## 2018-11-19 ENCOUNTER — Other Ambulatory Visit: Payer: Medicare Other

## 2018-11-20 ENCOUNTER — Other Ambulatory Visit: Payer: Self-pay

## 2018-11-20 ENCOUNTER — Ambulatory Visit
Admission: RE | Admit: 2018-11-20 | Discharge: 2018-11-20 | Disposition: A | Discharge status: Home / Self Care | Payer: Medicare Other | Source: Ambulatory Visit | Attending: Physician Assistant | Admitting: Physician Assistant

## 2018-11-20 DIAGNOSIS — M810 Age-related osteoporosis without current pathological fracture: Secondary | ICD-10-CM | POA: Diagnosis not present

## 2018-11-27 ENCOUNTER — Telehealth: Payer: Self-pay

## 2018-11-27 NOTE — Telephone Encounter (Signed)
-----   Message from Mar Daring, [place] sent at 11/26/2018  1:13 PM EDT ----- Bone density does show some osteopenia (thinning of the bones) but not osteoporotic yet. It is recommended to take OTC Calcium 1200mg  and Vit D 800 IU daily. Also doing weight bearing exercises such as walking and yoga will help bone strength. We can repeat in 3-5 years if desired.

## 2018-11-27 NOTE — Telephone Encounter (Signed)
No answer; unable to LM.

## 2018-11-28 ENCOUNTER — Other Ambulatory Visit: Payer: Self-pay | Admitting: Physician Assistant

## 2018-11-28 DIAGNOSIS — I10 Essential (primary) hypertension: Secondary | ICD-10-CM

## 2018-11-28 DIAGNOSIS — E7800 Pure hypercholesterolemia, unspecified: Secondary | ICD-10-CM

## 2018-12-02 NOTE — Telephone Encounter (Signed)
Patient was notified of results. Expressed understanding.  

## 2018-12-02 NOTE — Telephone Encounter (Signed)
No answer

## 2018-12-08 ENCOUNTER — Other Ambulatory Visit: Payer: Self-pay | Admitting: Physician Assistant

## 2018-12-08 DIAGNOSIS — M1990 Unspecified osteoarthritis, unspecified site: Secondary | ICD-10-CM

## 2018-12-26 ENCOUNTER — Encounter: Payer: Self-pay | Admitting: Physician Assistant

## 2018-12-26 ENCOUNTER — Other Ambulatory Visit: Payer: Self-pay

## 2018-12-26 ENCOUNTER — Ambulatory Visit (INDEPENDENT_AMBULATORY_CARE_PROVIDER_SITE_OTHER): Payer: Medicare Other | Admitting: Physician Assistant

## 2018-12-26 VITALS — BP 129/69 | HR 65 | Temp 98.6°F | Resp 16 | Ht 60.0 in | Wt 184.0 lb

## 2018-12-26 DIAGNOSIS — Z01818 Encounter for other preprocedural examination: Secondary | ICD-10-CM

## 2018-12-26 NOTE — Progress Notes (Signed)
Patient: Derek Huneycutt Female    DOB: [birth year]   78 y.o.   MRN: [id number] Visit Date: 12/31/2018  Today's Provider: Mar Daring, PA-C   Chief Complaint  Patient presents with  . Pre-op Exam   Subjective:     HPI    Cyndie Woodbeck has been scheduled for Lt THA anterior Hip. Surgery scheduled at [facility] on 01/08/19 by Dr. Kurtis Bushman. The patient has been seen and labs reviewed.  There are no changes in the patient's condition to prevent proceeding with the planned procedure. No previous issue with anesthesia in the past.  Recent labs:  Lab Results  Component Value Date   WBC 7.2 08/12/2018   HGB 12.5 08/12/2018   HCT 39.0 08/12/2018   PLT 335 08/12/2018   GLUCOSE 98 08/12/2018   CHOL 163 08/12/2018   TRIG 176 (H) 08/12/2018   HDL 70 08/12/2018   LDLCALC 58 08/12/2018   ALT 14 08/12/2018   AST 19 08/12/2018   NA 142 08/12/2018   K 4.8 08/12/2018   CL 103 08/12/2018   CREATININE 0.60 08/12/2018   BUN 10 08/12/2018   CO2 24 08/12/2018   TSH 2.680 08/12/2018   HGBA1C 6.7 (H) 08/12/2018   MICROALBUR 20 07/16/2015     Allergies  Allergen Reactions  . Citrus Other (See Comments)    Rash in mouth and on arms     Current Outpatient Medications:  .  acetaminophen (TYLENOL) 500 MG tablet, Take 1,000 mg by mouth daily as needed for moderate pain or headache., Disp: , Rfl:  .  aspirin 81 MG tablet, Take 81 mg by mouth daily. , Disp: , Rfl:  .  Blood Glucose Monitoring Suppl (ONETOUCH VERIO IQ SYSTEM) W/DEVICE KIT, Check blood sugar a.c.h.s., Disp: 1 kit, Rfl: 0 .  gabapentin (NEURONTIN) 400 MG capsule, TAKE 1 CAPSULE(400 MG) BY MOUTH THREE TIMES DAILY (Patient taking differently: Take 400 mg by mouth 3 (three) times daily. ), Disp: 90 capsule, Rfl: 1 .  glipiZIDE (GLUCOTROL) 5 MG tablet, TAKE 1/2 TABLET BY MOUTH EVERY DAY BEFORE SUPPER (Patient taking differently: Take 2.5 mg by mouth every evening. ), Disp: 90 tablet, Rfl: 1 .  losartan (COZAAR) 50 MG  tablet, Take 1 tablet (50 mg total) by mouth every evening., Disp: 90 tablet, Rfl: 1 .  metFORMIN (GLUCOPHAGE) 1000 MG tablet, TAKE 1 TABLET(1000 MG) BY MOUTH TWICE DAILY WITH A MEAL (Patient taking differently: Take 1,000 mg by mouth 2 (two) times daily. ), Disp: 180 tablet, Rfl: 1 .  Multiple Vitamin (MULTI VITAMIN DAILY PO), Take 2 tablets by mouth daily. , Disp: , Rfl:  .  ONETOUCH DELICA LANCETS FINE MISC, USE TO CHECK BLOOD SUGAR ONCE TO TWICE DAILY, Disp: 100 each, Rfl: 5 .  ONETOUCH VERIO test strip, USE TO CHECK BLOOD SUGAR EVERY DAY, Disp: 100 each, Rfl: 1 .  pravastatin (PRAVACHOL) 80 MG tablet, Take 1 tablet (80 mg total) by mouth every evening., Disp: 90 tablet, Rfl: 1 .  vitamin B-12 (CYANOCOBALAMIN) 1000 MCG tablet, Take 1,000 mcg by mouth daily after breakfast., Disp: , Rfl:  .  Calcium Citrate-Vitamin D (CALCIUM + D PO), Take 3 tablets by mouth daily., Disp: , Rfl:  .  Omega-3 Fatty Acids (FISH OIL PO), Take 1,600 mg by mouth daily., Disp: , Rfl:   Review of Systems  Constitutional: Negative for appetite change, chills, diaphoresis, fatigue and fever.  Eyes: Negative for visual disturbance.  Respiratory: Negative for chest  tightness, shortness of breath and wheezing.   Cardiovascular: Negative for chest pain, palpitations and leg swelling.  Gastrointestinal: Negative for abdominal distention, abdominal pain, constipation, diarrhea and nausea.  Musculoskeletal: Positive for arthralgias and gait problem (antalgic).  Neurological: Negative for dizziness, weakness, light-headedness, numbness and headaches.    Social History   Tobacco Use  . Smoking status: Never Smoker  . Smokeless tobacco: Never Used  Substance Use Topics  . Alcohol use: No    Frequency: Never      Objective:   BP 129/69 (BP Location: Left Arm, Patient Position: Sitting, Cuff Size: Large)   Pulse 65   Temp 98.6 F (37 C) (Oral)   Resp 16   Ht 5' (1.524 m)   Wt 184 lb (83.5 kg)   BMI 35.94 kg/m   Vitals:   12/26/18 1331  BP: 129/69  Pulse: 65  Resp: 16  Temp: 98.6 F (37 C)  TempSrc: Oral  Weight: 184 lb (83.5 kg)  Height: 5' (1.524 m)     Physical Exam Vitals signs reviewed.  Constitutional:      General: She is not in acute distress.    Appearance: Normal appearance. She is well-developed. She is obese. She is not ill-appearing or diaphoretic.  HENT:     Head: Normocephalic and atraumatic.     Mouth/Throat:     Mouth: Mucous membranes are moist.  Eyes:     General: No scleral icterus.    Extraocular Movements: Extraocular movements intact.  Neck:     Musculoskeletal: Normal range of motion and neck supple.     Thyroid: No thyromegaly.     Vascular: No JVD.     Trachea: No tracheal deviation.  Cardiovascular:     Rate and Rhythm: Normal rate and regular rhythm.     Pulses: Normal pulses.     Heart sounds: Normal heart sounds. No murmur. No friction rub. No gallop.   Pulmonary:     Effort: Pulmonary effort is normal. No respiratory distress.     Breath sounds: Normal breath sounds. No wheezing or rales.  Abdominal:     General: Bowel sounds are normal.     Palpations: Abdomen is soft. There is no mass.     Tenderness: There is no abdominal tenderness.  Lymphadenopathy:     Cervical: No cervical adenopathy.  Skin:    Capillary Refill: Capillary refill takes less than 2 seconds.  Neurological:     General: No focal deficit present.     Mental Status: She is alert. Mental status is at baseline.     Cranial Nerves: No cranial nerve deficit.     Motor: No weakness.     Gait: Gait abnormal (antalgic and slowed due to hip pain).  Psychiatric:        Mood and Affect: Mood normal.        Behavior: Behavior normal.        Thought Content: Thought content normal.        Judgment: Judgment normal.     No results found for any visits on 12/26/18.     Assessment & Plan    1. Preoperative clearance UA normal. EKG shows NSR rate of 63 with nonspecific T wave  abnormality. No chest pain or SOB. Labs from February were normal. A1c is 6.7. Using the SORT calculator for risk, patient's risk is low for post-op complications at 3.90%. Highest risk would be post op infection due to obesity. She is medically cleared for surgery.  -  POCT urinalysis dipstick - EKG 12-Lead     Mar Daring, PA-C  [facility]

## 2018-12-30 ENCOUNTER — Ambulatory Visit: Payer: Self-pay | Admitting: Orthopedic Surgery

## 2018-12-31 ENCOUNTER — Inpatient Hospital Stay: Admission: RE | Admit: 2018-12-31 | Payer: Medicare Other | Source: Ambulatory Visit

## 2018-12-31 LAB — POCT URINALYSIS DIPSTICK
Appearance: NORMAL
Bilirubin, UA: NEGATIVE
Blood, UA: NEGATIVE
Glucose, UA: NEGATIVE
Ketones, UA: NEGATIVE
Leukocytes, UA: NEGATIVE
Nitrite, UA: NEGATIVE
Protein, UA: NEGATIVE
Spec Grav, UA: 1.02 (ref 1.010–1.025)
Urobilinogen, UA: 0.2 E.U./dL
pH, UA: 6 (ref 5.0–8.0)

## 2019-01-01 ENCOUNTER — Encounter
Admission: RE | Admit: 2019-01-01 | Discharge: 2019-01-01 | Disposition: A | Discharge status: Home / Self Care | Payer: Medicare Other | Source: Ambulatory Visit | Attending: Orthopedic Surgery | Admitting: Orthopedic Surgery

## 2019-01-01 ENCOUNTER — Other Ambulatory Visit: Payer: Self-pay

## 2019-01-01 DIAGNOSIS — Z01812 Encounter for preprocedural laboratory examination: Secondary | ICD-10-CM | POA: Diagnosis not present

## 2019-01-01 DIAGNOSIS — Z1159 Encounter for screening for other viral diseases: Secondary | ICD-10-CM | POA: Diagnosis not present

## 2019-01-01 HISTORY — DX: Dyspnea, unspecified: R06.00

## 2019-01-01 LAB — BASIC METABOLIC PANEL
Anion gap: 10 (ref 5–15)
BUN: 10 mg/dL (ref 8–23)
CO2: 28 mmol/L (ref 22–32)
Calcium: 9.9 mg/dL (ref 8.9–10.3)
Chloride: 104 mmol/L (ref 98–111)
Creatinine, Ser: 0.58 mg/dL (ref 0.44–1.00)
GFR calc Af Amer: >60 mL/min (ref >60)
GFR calc non Af Amer: >60 mL/min (ref >60)
Glucose, Bld: 129 mg/dL — ABNORMAL HIGH (ref 70–99)
Potassium: 4.3 mmol/L (ref 3.5–5.1)
Sodium: 142 mmol/L (ref 135–145)

## 2019-01-01 LAB — URINALYSIS, ROUTINE W REFLEX MICROSCOPIC
Bilirubin Urine: NEGATIVE
Glucose, UA: 50 mg/dL — AB
Hgb urine dipstick: NEGATIVE
Ketones, ur: NEGATIVE mg/dL
Leukocytes,Ua: NEGATIVE
Nitrite: NEGATIVE
Protein, ur: NEGATIVE mg/dL
Specific Gravity, Urine: 1.016 (ref 1.005–1.030)
pH: 7 (ref 5.0–8.0)

## 2019-01-01 LAB — CBC
HCT: 40.3 % (ref 36.0–46.0)
Hemoglobin: 12.7 g/dL (ref 12.0–15.0)
MCH: 30.4 pg (ref 26.0–34.0)
MCHC: 31.5 g/dL (ref 30.0–36.0)
MCV: 96.4 fL (ref 80.0–100.0)
Platelets: 314 10*3/uL (ref 150–400)
RBC: 4.18 MIL/uL (ref 3.87–5.11)
RDW: 13.6 % (ref 11.5–15.5)
WBC: 5.8 10*3/uL (ref 4.0–10.5)
nRBC: 0 % (ref 0.0–0.2)

## 2019-01-01 LAB — TYPE AND SCREEN
ABO/RH(D): A POS
Antibody Screen: NEGATIVE

## 2019-01-01 LAB — PROTIME-INR
INR: 0.9 (ref 0.8–1.2)
Prothrombin Time: 12.2 seconds (ref 11.4–15.2)

## 2019-01-01 LAB — APTT: aPTT: 29 seconds (ref 24–36)

## 2019-01-01 LAB — SURGICAL PCR SCREEN
MRSA, PCR: NEGATIVE
Staphylococcus aureus: POSITIVE — AB

## 2019-01-01 NOTE — Patient Instructions (Signed)
Your procedure is scheduled on: 01/08/2019 Wed Report to Same Day Surgery 2nd floor medical mall [facility] Entrance-take elevator on left to 2nd floor.  Check in with surgery information desk.) To find out your arrival time please call [phone] between 1PM - 3PM on 01/07/2019 Tues  Remember: Instructions that are not followed completely may result in serious medical risk, up to and including death, or upon the discretion of your surgeon and anesthesiologist your surgery may need to be rescheduled.    _x___ 1. Do not eat food after midnight the night before your procedure. You may drink clear liquids up to 2 hours before you are scheduled to arrive at the hospital for your procedure.  Do not drink clear liquids within 2 hours of your scheduled arrival to the hospital.  Clear liquids include  --Water or Apple juice without pulp  --Clear carbohydrate beverage such as ClearFast or Gatorade  --Black Coffee or Clear Tea (No milk, no creamers, do not add anything to                  the coffee or Tea Type 1 and type 2 diabetics should only drink water.   ____Ensure clear carbohydrate drink on the way to the hospital for bariatric patients  _x___Ensure clear carbohydrate drink 3 hours before surgery.  No gum chewing or hard candies.     __x__ 2. No Alcohol for 24 hours before or after surgery.   __x__3. No Smoking or e-cigarettes for 24 prior to surgery.  Do not use any chewable tobacco products for at least 6 hour prior to surgery   ____  4. Bring all medications with you on the day of surgery if instructed.    __x__ 5. Notify your doctor if there is any change in your medical condition     (cold, fever, infections).    x___6. On the morning of surgery brush your teeth with toothpaste and water.  You may rinse your mouth with mouth wash if you wish.  Do not swallow any toothpaste or mouthwash.   Do not wear jewelry, make-up, hairpins, clips or nail polish.  Do not wear lotions,  powders, or perfumes. You may wear deodorant.  Do not shave 48 hours prior to surgery. Men may shave face and neck.  Do not bring valuables to the hospital.    [facility] is not responsible for any belongings or valuables.               Contacts, dentures or bridgework may not be worn into surgery.  Leave your suitcase in the car. After surgery it may be brought to your room.  For patients admitted to the hospital, discharge time is determined by your                       treatment team.  _  Patients discharged the day of surgery will not be allowed to drive home.  You will need someone to drive you home and stay with you the night of your procedure.    Please read over the following fact sheets that you were given:   [facility] Preparing for Surgery and or MRSA Information   _x___ Take anti-hypertensive listed below, cardiac, seizure, asthma,     anti-reflux and psychiatric medicines. These include:  1. gabapentin (NEURONTIN) 400 MG capsule  2.  3.  4.  5.  6.  ____Fleets enema or Magnesium Citrate as directed.   _x___ Use  CHG Soap or sage wipes as directed on instruction sheet   ____ Use inhalers on the day of surgery and bring to hospital day of surgery  __x__ Stop Metformin and Janumet 2 days prior to surgery.    ____ Take 1/2 of usual insulin dose the night before surgery and none on the morning     surgery.   _x___ Follow recommendations from Cardiologist, Pulmonologist or PCP regarding          stopping Aspirin, Coumadin, Plavix ,Eliquis, Effient, or Pradaxa, and Pletal. To stop aspirin today per Dr Harlow Mares.  X____Stop Anti-inflammatories such as Advil, Aleve, Ibuprofen, Motrin, Naproxen, Naprosyn, Goodies powders or aspirin products. OK to take Tylenol and                          Celebrex.   _x___ Stop supplements until after surgery.  But may continue Vitamin D, Vitamin B,       and multivitamin.  To stop fish oils today   ____ Bring C-Pap to the hospital.

## 2019-01-01 NOTE — Pre-Procedure Instructions (Signed)
Medical clearance on chart. 

## 2019-01-03 ENCOUNTER — Other Ambulatory Visit
Admission: RE | Admit: 2019-01-03 | Discharge: 2019-01-03 | Disposition: A | Discharge status: Home / Self Care | Payer: Medicare Other | Source: Ambulatory Visit | Attending: Orthopedic Surgery | Admitting: Orthopedic Surgery

## 2019-01-03 ENCOUNTER — Other Ambulatory Visit: Payer: Self-pay

## 2019-01-03 DIAGNOSIS — Z01812 Encounter for preprocedural laboratory examination: Secondary | ICD-10-CM | POA: Diagnosis not present

## 2019-01-04 LAB — SARS CORONAVIRUS 2 (TAT 6-24 HRS): SARS Coronavirus 2: NEGATIVE

## 2019-01-07 MED ORDER — TRANEXAMIC ACID-NACL 1000-0.7 MG/100ML-% IV SOLN
1000.0000 mg | INTRAVENOUS | Status: AC
  Administered 2019-01-08: 1000 mg via INTRAVENOUS
  Filled 2019-01-07: qty 100

## 2019-01-07 MED ORDER — CEFAZOLIN SODIUM-DEXTROSE 2-4 GM/100ML-% IV SOLN
2.0000 g | INTRAVENOUS | Status: AC
  Administered 2019-01-08: 2 g via INTRAVENOUS

## 2019-01-08 ENCOUNTER — Encounter
Admission: RE | Disposition: A | Discharge status: Home Health | Payer: Self-pay | Source: Home / Self Care | Attending: Orthopedic Surgery

## 2019-01-08 ENCOUNTER — Encounter: Payer: Self-pay | Admitting: *Deleted

## 2019-01-08 ENCOUNTER — Inpatient Hospital Stay: Payer: Medicare Other

## 2019-01-08 ENCOUNTER — Inpatient Hospital Stay: Payer: Medicare Other | Admitting: Certified Registered"

## 2019-01-08 ENCOUNTER — Other Ambulatory Visit: Payer: Self-pay

## 2019-01-08 ENCOUNTER — Inpatient Hospital Stay
Admission: RE | Admit: 2019-01-08 | Discharge: 2019-01-10 | DRG: 470 | Disposition: A | Discharge status: Home Health | Payer: Medicare Other | Attending: Orthopedic Surgery | Admitting: Orthopedic Surgery

## 2019-01-08 DIAGNOSIS — Z91018 Allergy to other foods: Secondary | ICD-10-CM | POA: Diagnosis not present

## 2019-01-08 DIAGNOSIS — M25752 Osteophyte, left hip: Secondary | ICD-10-CM | POA: Diagnosis present

## 2019-01-08 DIAGNOSIS — Z96641 Presence of right artificial hip joint: Secondary | ICD-10-CM | POA: Diagnosis present

## 2019-01-08 DIAGNOSIS — Z79891 Long term (current) use of opiate analgesic: Secondary | ICD-10-CM | POA: Diagnosis not present

## 2019-01-08 DIAGNOSIS — M1611 Unilateral primary osteoarthritis, right hip: Secondary | ICD-10-CM | POA: Diagnosis present

## 2019-01-08 DIAGNOSIS — Z79899 Other long term (current) drug therapy: Secondary | ICD-10-CM

## 2019-01-08 DIAGNOSIS — Z7982 Long term (current) use of aspirin: Secondary | ICD-10-CM | POA: Diagnosis not present

## 2019-01-08 DIAGNOSIS — Z79890 Hormone replacement therapy (postmenopausal): Secondary | ICD-10-CM

## 2019-01-08 DIAGNOSIS — K219 Gastro-esophageal reflux disease without esophagitis: Secondary | ICD-10-CM | POA: Diagnosis present

## 2019-01-08 DIAGNOSIS — E119 Type 2 diabetes mellitus without complications: Secondary | ICD-10-CM | POA: Diagnosis present

## 2019-01-08 DIAGNOSIS — I10 Essential (primary) hypertension: Secondary | ICD-10-CM | POA: Diagnosis present

## 2019-01-08 DIAGNOSIS — Z1159 Encounter for screening for other viral diseases: Secondary | ICD-10-CM

## 2019-01-08 DIAGNOSIS — Z09 Encounter for follow-up examination after completed treatment for conditions other than malignant neoplasm: Secondary | ICD-10-CM

## 2019-01-08 DIAGNOSIS — E785 Hyperlipidemia, unspecified: Secondary | ICD-10-CM | POA: Diagnosis present

## 2019-01-08 DIAGNOSIS — M1612 Unilateral primary osteoarthritis, left hip: Principal | ICD-10-CM | POA: Diagnosis present

## 2019-01-08 DIAGNOSIS — Z419 Encounter for procedure for purposes other than remedying health state, unspecified: Secondary | ICD-10-CM

## 2019-01-08 HISTORY — PX: TOTAL HIP ARTHROPLASTY: SHX124

## 2019-01-08 LAB — ABO/RH: ABO/RH(D): A POS

## 2019-01-08 LAB — GLUCOSE, CAPILLARY
Glucose-Capillary: 144 mg/dL — ABNORMAL HIGH (ref 70–99)
Glucose-Capillary: 142 mg/dL — ABNORMAL HIGH (ref 70–99)

## 2019-01-08 SURGERY — ARTHROPLASTY, HIP, TOTAL, ANTERIOR APPROACH
Anesthesia: Spinal | Site: Hip | Laterality: Left

## 2019-01-08 MED ORDER — MAGNESIUM CITRATE PO SOLN
1.0000 | Freq: Once | ORAL | Status: DC | PRN
  Filled 2019-01-08: qty 296

## 2019-01-08 MED ORDER — CALCIUM CITRATE-VITAMIN D 500-500 MG-UNIT PO CHEW
1.0000 | CHEWABLE_TABLET | Freq: Every day | ORAL | Status: DC
  Administered 2019-01-09 – 2019-01-10 (×2): 1 via ORAL
  Filled 2019-01-08 (×3): qty 1

## 2019-01-08 MED ORDER — LOSARTAN POTASSIUM 50 MG PO TABS
50.0000 mg | ORAL_TABLET | Freq: Every evening | ORAL | Status: DC
  Administered 2019-01-09: 50 mg via ORAL
  Filled 2019-01-08: qty 1

## 2019-01-08 MED ORDER — FENTANYL CITRATE (PF) 100 MCG/2ML IJ SOLN: 25.0000 ug | INTRAMUSCULAR | Status: DC | PRN

## 2019-01-08 MED ORDER — PROPOFOL 500 MG/50ML IV EMUL
INTRAVENOUS | Status: DC | PRN
  Administered 2019-01-08: 30 ug/kg/min via INTRAVENOUS

## 2019-01-08 MED ORDER — ONDANSETRON HCL 4 MG PO TABS
4.0000 mg | ORAL_TABLET | Freq: Four times a day (QID) | ORAL | Status: DC | PRN
  Administered 2019-01-09: 01:00:00 4 mg via ORAL
  Filled 2019-01-08: qty 1

## 2019-01-08 MED ORDER — CALCIUM CITRATE-VITAMIN D 315-200 MG-UNIT PO TABS: 1.0000 | ORAL_TABLET | Freq: Every day | ORAL | Status: DC

## 2019-01-08 MED ORDER — FAMOTIDINE 20 MG PO TABS
ORAL_TABLET | ORAL | Status: AC
  Administered 2019-01-08: 20 mg via ORAL
  Filled 2019-01-08: qty 1

## 2019-01-08 MED ORDER — FENTANYL CITRATE (PF) 100 MCG/2ML IJ SOLN
INTRAMUSCULAR | Status: DC | PRN
  Administered 2019-01-08: 50 ug via INTRAVENOUS

## 2019-01-08 MED ORDER — BISACODYL 10 MG RE SUPP
10.0000 mg | Freq: Every day | RECTAL | Status: DC | PRN
  Administered 2019-01-09: 10 mg via RECTAL
  Filled 2019-01-08: qty 1

## 2019-01-08 MED ORDER — ACETAMINOPHEN 500 MG PO TABS
500.0000 mg | ORAL_TABLET | Freq: Four times a day (QID) | ORAL | Status: AC
  Administered 2019-01-08 – 2019-01-09 (×4): 500 mg via ORAL
  Filled 2019-01-08 (×4): qty 1

## 2019-01-08 MED ORDER — PHENYLEPHRINE HCL (PRESSORS) 10 MG/ML IV SOLN
INTRAVENOUS | Status: DC | PRN
  Administered 2019-01-08: 50 ug via INTRAVENOUS
  Administered 2019-01-08: 100 ug via INTRAVENOUS

## 2019-01-08 MED ORDER — DOCUSATE SODIUM 100 MG PO CAPS
100.0000 mg | ORAL_CAPSULE | Freq: Two times a day (BID) | ORAL | Status: DC
  Administered 2019-01-08 – 2019-01-10 (×4): 100 mg via ORAL
  Filled 2019-01-08 (×4): qty 1

## 2019-01-08 MED ORDER — BUPIVACAINE HCL (PF) 0.5 % IJ SOLN
INTRAMUSCULAR | Status: DC | PRN
  Administered 2019-01-08: 3 mL

## 2019-01-08 MED ORDER — POVIDONE-IODINE 10 % EX SWAB: 2.0000 "application " | Freq: Once | CUTANEOUS | Status: DC

## 2019-01-08 MED ORDER — LACTATED RINGERS IV SOLN
INTRAVENOUS | Status: DC
  Administered 2019-01-08: 12:00:00 via INTRAVENOUS

## 2019-01-08 MED ORDER — ONDANSETRON HCL 4 MG/2ML IJ SOLN: 4.0000 mg | Freq: Four times a day (QID) | INTRAMUSCULAR | Status: DC | PRN

## 2019-01-08 MED ORDER — METFORMIN HCL 500 MG PO TABS
1000.0000 mg | ORAL_TABLET | Freq: Two times a day (BID) | ORAL | Status: DC
  Administered 2019-01-08 – 2019-01-10 (×4): 1000 mg via ORAL
  Filled 2019-01-08 (×4): qty 2

## 2019-01-08 MED ORDER — ACETAMINOPHEN 500 MG PO TABS
1000.0000 mg | ORAL_TABLET | Freq: Once | ORAL | Status: AC
  Administered 2019-01-08: 07:00:00 1000 mg via ORAL

## 2019-01-08 MED ORDER — METOCLOPRAMIDE HCL 5 MG/ML IJ SOLN: 5.0000 mg | Freq: Three times a day (TID) | INTRAMUSCULAR | Status: DC | PRN

## 2019-01-08 MED ORDER — HYDROCODONE-ACETAMINOPHEN 5-325 MG PO TABS
1.0000 | ORAL_TABLET | ORAL | Status: DC | PRN
  Administered 2019-01-08 – 2019-01-09 (×2): 1 via ORAL
  Filled 2019-01-08 (×3): qty 1

## 2019-01-08 MED ORDER — BUPIVACAINE-EPINEPHRINE (PF) 0.25% -1:200000 IJ SOLN
INTRAMUSCULAR | Status: DC | PRN
  Administered 2019-01-08: 20 mL via PERINEURAL

## 2019-01-08 MED ORDER — VITAMIN B-12 1000 MCG PO TABS
1000.0000 ug | ORAL_TABLET | Freq: Every day | ORAL | Status: DC
  Administered 2019-01-09 – 2019-01-10 (×2): 1000 ug via ORAL
  Filled 2019-01-08 (×2): qty 1

## 2019-01-08 MED ORDER — TRAMADOL HCL 50 MG PO TABS
50.0000 mg | ORAL_TABLET | Freq: Four times a day (QID) | ORAL | Status: DC
  Administered 2019-01-08 – 2019-01-10 (×7): 50 mg via ORAL
  Filled 2019-01-08 (×8): qty 1

## 2019-01-08 MED ORDER — PROPOFOL 10 MG/ML IV BOLUS
INTRAVENOUS | Status: DC | PRN
  Administered 2019-01-08: 20 mg via INTRAVENOUS

## 2019-01-08 MED ORDER — GABAPENTIN 400 MG PO CAPS
400.0000 mg | ORAL_CAPSULE | Freq: Three times a day (TID) | ORAL | Status: DC
  Administered 2019-01-08 – 2019-01-10 (×6): 400 mg via ORAL
  Filled 2019-01-08 (×6): qty 1

## 2019-01-08 MED ORDER — CELECOXIB 200 MG PO CAPS
ORAL_CAPSULE | ORAL | Status: AC
  Administered 2019-01-08: 400 mg via ORAL
  Filled 2019-01-08: qty 2

## 2019-01-08 MED ORDER — PROPOFOL 500 MG/50ML IV EMUL
INTRAVENOUS | Status: AC
  Filled 2019-01-08: qty 50

## 2019-01-08 MED ORDER — KETOROLAC TROMETHAMINE 15 MG/ML IJ SOLN
7.5000 mg | Freq: Four times a day (QID) | INTRAMUSCULAR | Status: AC
  Administered 2019-01-08 – 2019-01-09 (×4): 7.5 mg via INTRAVENOUS
  Filled 2019-01-08 (×4): qty 1

## 2019-01-08 MED ORDER — BUPIVACAINE-EPINEPHRINE (PF) 0.25% -1:200000 IJ SOLN
INTRAMUSCULAR | Status: AC
  Filled 2019-01-08: qty 30

## 2019-01-08 MED ORDER — SODIUM CHLORIDE 0.9 % IR SOLN
Status: DC | PRN
  Administered 2019-01-08 (×2): 1000 mL

## 2019-01-08 MED ORDER — MENTHOL 3 MG MT LOZG
1.0000 | LOZENGE | OROMUCOSAL | Status: DC | PRN
  Filled 2019-01-08: qty 9

## 2019-01-08 MED ORDER — ASPIRIN 81 MG PO CHEW
81.0000 mg | CHEWABLE_TABLET | Freq: Two times a day (BID) | ORAL | Status: DC
  Administered 2019-01-08 – 2019-01-10 (×4): 81 mg via ORAL
  Filled 2019-01-08 (×4): qty 1

## 2019-01-08 MED ORDER — PHENOL 1.4 % MT LIQD
1.0000 | OROMUCOSAL | Status: DC | PRN
  Filled 2019-01-08: qty 177

## 2019-01-08 MED ORDER — CHLORHEXIDINE GLUCONATE 4 % EX LIQD: 60.0000 mL | Freq: Once | CUTANEOUS | Status: DC

## 2019-01-08 MED ORDER — HYDROCODONE-ACETAMINOPHEN 7.5-325 MG PO TABS: 1.0000 | ORAL_TABLET | ORAL | Status: DC | PRN

## 2019-01-08 MED ORDER — CEFAZOLIN SODIUM-DEXTROSE 2-4 GM/100ML-% IV SOLN
INTRAVENOUS | Status: AC
  Filled 2019-01-08: qty 100

## 2019-01-08 MED ORDER — CELECOXIB 200 MG PO CAPS
400.0000 mg | ORAL_CAPSULE | Freq: Once | ORAL | Status: AC
  Administered 2019-01-08: 07:00:00 400 mg via ORAL

## 2019-01-08 MED ORDER — GLIPIZIDE 5 MG PO TABS
2.5000 mg | ORAL_TABLET | Freq: Every evening | ORAL | Status: DC
  Administered 2019-01-08 – 2019-01-09 (×2): 2.5 mg via ORAL
  Filled 2019-01-08 (×3): qty 0.5

## 2019-01-08 MED ORDER — MAGNESIUM HYDROXIDE 400 MG/5ML PO SUSP: 30.0000 mL | Freq: Every day | ORAL | Status: DC | PRN

## 2019-01-08 MED ORDER — SODIUM CHLORIDE 0.9 % IV SOLN
INTRAVENOUS | Status: DC
  Administered 2019-01-08 (×2): via INTRAVENOUS

## 2019-01-08 MED ORDER — ACETAMINOPHEN 500 MG PO TABS
ORAL_TABLET | ORAL | Status: AC
  Administered 2019-01-08: 1000 mg via ORAL
  Filled 2019-01-08: qty 2

## 2019-01-08 MED ORDER — FENTANYL CITRATE (PF) 100 MCG/2ML IJ SOLN
INTRAMUSCULAR | Status: AC
  Filled 2019-01-08: qty 2

## 2019-01-08 MED ORDER — PRAVASTATIN SODIUM 20 MG PO TABS
80.0000 mg | ORAL_TABLET | Freq: Every evening | ORAL | Status: DC
  Administered 2019-01-09: 80 mg via ORAL
  Filled 2019-01-08: qty 4

## 2019-01-08 MED ORDER — METOCLOPRAMIDE HCL 10 MG PO TABS: 5.0000 mg | ORAL_TABLET | Freq: Three times a day (TID) | ORAL | Status: DC | PRN

## 2019-01-08 MED ORDER — MORPHINE SULFATE (PF) 2 MG/ML IV SOLN: 0.5000 mg | INTRAVENOUS | Status: DC | PRN

## 2019-01-08 MED ORDER — ACETAMINOPHEN 325 MG PO TABS: 325.0000 mg | ORAL_TABLET | Freq: Four times a day (QID) | ORAL | Status: DC | PRN

## 2019-01-08 MED ORDER — FAMOTIDINE 20 MG PO TABS
20.0000 mg | ORAL_TABLET | Freq: Once | ORAL | Status: AC
  Administered 2019-01-08: 07:00:00 20 mg via ORAL

## 2019-01-08 MED ORDER — EPHEDRINE SULFATE 50 MG/ML IJ SOLN
INTRAMUSCULAR | Status: DC | PRN
  Administered 2019-01-08: 5 mg via INTRAVENOUS

## 2019-01-08 MED ORDER — CEFAZOLIN SODIUM-DEXTROSE 2-4 GM/100ML-% IV SOLN
2.0000 g | Freq: Four times a day (QID) | INTRAVENOUS | Status: AC
  Administered 2019-01-08: 18:00:00 2 g via INTRAVENOUS
  Filled 2019-01-08 (×2): qty 100

## 2019-01-08 SURGICAL SUPPLY — 53 items
BIT DRILL STRL 25MM (BIT) IMPLANT
BLADE SAGITTAL WIDE XTHICK NO (BLADE) ×3 IMPLANT
BRUSH SCRUB EZ  4% CHG (MISCELLANEOUS) ×4
BRUSH SCRUB EZ 4% CHG (MISCELLANEOUS) ×2 IMPLANT
CHLORAPREP W/TINT 26 (MISCELLANEOUS) ×3 IMPLANT
COVER HOLE (Hips) ×2 IMPLANT
COVER WAND RF STERILE (DRAPES) ×3 IMPLANT
DRAPE C-ARM 42X72 X-RAY (DRAPES) ×3 IMPLANT
DRAPE SHEET LG 3/4 BI-LAMINATE (DRAPES) ×3 IMPLANT
DRAPE STERI IOBAN 125X83 (DRAPES) ×2 IMPLANT
DRILL BIT STERILE 25MM (BIT) ×3
DRSG AQUACEL AG ADV 3.5X 6 (GAUZE/BANDAGES/DRESSINGS) ×2 IMPLANT
DRSG AQUACEL AG ADV 3.5X10 (GAUZE/BANDAGES/DRESSINGS) ×2 IMPLANT
DRSG AQUACEL AG ADV 3.5X14 (GAUZE/BANDAGES/DRESSINGS) IMPLANT
ELECT BLADE 6.5 EXT (BLADE) ×3 IMPLANT
ELECT REM PT RETURN 9FT ADLT (ELECTROSURGICAL) ×3
ELECTRODE REM PT RTRN 9FT ADLT (ELECTROSURGICAL) ×1 IMPLANT
GAUZE XEROFORM 1X8 LF (GAUZE/BANDAGES/DRESSINGS) ×4 IMPLANT
GLOVE INDICATOR 8.0 STRL GRN (GLOVE) ×3 IMPLANT
GLOVE SURG ORTHO 8.0 STRL STRW (GLOVE) ×6 IMPLANT
GOWN STRL REUS W/ TWL LRG LVL3 (GOWN DISPOSABLE) ×1 IMPLANT
GOWN STRL REUS W/ TWL XL LVL3 (GOWN DISPOSABLE) ×1 IMPLANT
GOWN STRL REUS W/TWL LRG LVL3 (GOWN DISPOSABLE) ×2
GOWN STRL REUS W/TWL XL LVL3 (GOWN DISPOSABLE) ×2
HEAD OXINIUM PLUS 0 32MM (Hips) ×2 IMPLANT
HOOD PEEL AWAY FLYTE STAYCOOL (MISCELLANEOUS) ×9 IMPLANT
IV NS 1000ML (IV SOLUTION) ×2
IV NS 1000ML BAXH (IV SOLUTION) ×1 IMPLANT
KIT PATIENT CARE HANA TABLE (KITS) ×3 IMPLANT
KIT TURNOVER CYSTO (KITS) ×3 IMPLANT
LINER 3H HEMI SHELL 48MM (Liner) ×2 IMPLANT
LINER ACETABULAR 32X48 (Liner) ×2 IMPLANT
MAT ABSORB  FLUID 56X50 GRAY (MISCELLANEOUS) ×2
MAT ABSORB FLUID 56X50 GRAY (MISCELLANEOUS) ×1 IMPLANT
NDL SAFETY ECLIPSE 18X1.5 (NEEDLE) ×2 IMPLANT
NDL SPNL 20GX3.5 QUINCKE YW (NEEDLE) ×1 IMPLANT
NEEDLE HYPO 18GX1.5 SHARP (NEEDLE) ×8
NEEDLE HYPO 22GX1.5 SAFETY (NEEDLE) ×3 IMPLANT
NEEDLE SPNL 20GX3.5 QUINCKE YW (NEEDLE) ×3 IMPLANT
PACK HIP PROSTHESIS (MISCELLANEOUS) ×3 IMPLANT
PADDING CAST BLEND 4X4 NS (MISCELLANEOUS) ×6 IMPLANT
PILLOW ABDUCTION MEDIUM (MISCELLANEOUS) ×3 IMPLANT
PULSAVAC PLUS IRRIG FAN TIP (DISPOSABLE) ×3
SCREW 6.5X25MM (Screw) ×2 IMPLANT
STAPLER SKIN PROX 35W (STAPLE) ×5 IMPLANT
STEM STD COLLAR SZ1 POLARSTEM (Stem) ×2 IMPLANT
SUT BONE WAX W31G (SUTURE) ×3 IMPLANT
SUT DVC 2 QUILL PDO  T11 36X36 (SUTURE) ×2
SUT DVC 2 QUILL PDO T11 36X36 (SUTURE) ×1 IMPLANT
SUT VIC AB 2-0 CT1 18 (SUTURE) ×3 IMPLANT
SYR 10ML LL (SYRINGE) ×4 IMPLANT
SYR 20CC LL (SYRINGE) ×3 IMPLANT
TIP FAN IRRIG PULSAVAC PLUS (DISPOSABLE) ×1 IMPLANT

## 2019-01-08 NOTE — Progress Notes (Signed)
Bladder scan performed, 165ml present.  Patient also given sprite to drink.

## 2019-01-08 NOTE — Evaluation (Signed)
Physical Therapy Evaluation Patient Details Name: Kathleen Tran MRN: [id number] DOB: 03/31/1941 Today's Date: 01/08/2019   History of Present Illness  Pt is a 78 y.o. 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  PMH includes DM type 2, h/o R partial hip arthroplasty, htn, PVD.  Clinical Impression  Prior to hospital admission, pt was modified independent with ambulation using cane.  Pt lives with her husband and son in multi-story home with steps to 2nd floor.  Currently pt is min assist semi-supine to sit; CGA with transfers using walker; and CGA ambulating 10 feet and then 5 feet with youth sized RW (limited distance ambulating d/t L hip pain).  L hip pain 5/10 at rest beginning of session; increased to 10/10 with activity; decreased to 8/10 end of session sitting in recliner (ice pack applied and nurse notified of pt's request for pain meds).  Pt would benefit from skilled PT to address noted impairments and functional limitations (see below for any additional details).  Upon hospital discharge, recommend pt discharge with therapy per surgeon's recommendation.    Follow Up Recommendations Follow surgeon's recommendation for DC plan and follow-up therapies    Equipment Recommendations  Rolling walker with 5" wheels;3in1 (PT)(youth sized)    Recommendations for Other Services OT consult     Precautions / Restrictions Precautions Precautions: Anterior Hip;Fall Precaution Booklet Issued: Yes (comment) Restrictions Weight Bearing Restrictions: Yes LLE Weight Bearing: Weight bearing as tolerated      Mobility  Bed Mobility Overal bed mobility: Needs Assistance Bed Mobility: Supine to Sit     Supine to sit: Min assist;HOB elevated     General bed mobility comments: increased effort/time for pt to perform; use of bed rail; assist for L LE  Transfers Overall transfer level: Needs assistance Equipment used: Rolling walker (2 wheeled)(youth sized) Transfers: Sit to/from Stand Sit to  Stand: Min guard         General transfer comment: mild increased effort to stand from bed and toilet (with use of grab bar); vc's for UE/LE placement  Ambulation/Gait Ambulation/Gait assistance: Min guard Gait Distance (Feet): (10 feet bed to toilet (to bathroom); 5 feet toilet to recliner) Assistive device: Rolling walker (2 wheeled)(youth sized) Gait Pattern/deviations: Step-to pattern Gait velocity: decreased   General Gait Details: vc's to increase UE support through RW to offweight L LE when advancing R LE; decreased B LE step length; antalgic  Stairs            Wheelchair Mobility    Modified Rankin (Stroke Patients Only)       Balance Overall balance assessment: Needs assistance Sitting-balance support: No upper extremity supported;Feet supported Sitting balance-Leahy Scale: Good Sitting balance - Comments: steady sitting reaching within BOS   Standing balance support: No upper extremity supported Standing balance-Leahy Scale: Good Standing balance comment: steady standing washing hands at sink                             Pertinent Vitals/Pain Pain Assessment: 0-10 Pain Score: 8  Pain Location: L hip Pain Descriptors / Indicators: Sore;Burning Pain Intervention(s): Limited activity within patient's tolerance;Monitored during session;Repositioned;Patient requesting pain meds-RN notified;Ice applied  Vitals (HR and O2 on room air) stable and WFL throughout treatment session.    Home Living Family/patient expects to be discharged to:: Private residence Living Arrangements: Spouse/significant other;Children(pt's husband and son) Available Help at Discharge: Family Type of Home: Other(Comment)(Townhome) Home Access: Level entry     Home  Layout: Two level;Bed/bath upstairs;1/2 bath on main level(can sleep in recliner on main level) Home Equipment: Cane - quad;Cane - single point      Prior Function Level of Independence: Independent with  assistive device(s)         Comments: Ambulatory with cane prior to admission.     Hand Dominance        Extremity/Trunk Assessment   Upper Extremity Assessment Upper Extremity Assessment: Overall WFL for tasks assessed    Lower Extremity Assessment Lower Extremity Assessment: RLE deficits/detail;LLE deficits/detail RLE Deficits / Details: strength and ROM WFL LLE Deficits / Details: hip flexion at least 2+/5 (limited d/t hip pain); knee flexion/extension and DF at least 3/5 AROM LLE: Unable to fully assess due to pain    Cervical / Trunk Assessment Cervical / Trunk Assessment: Normal  Communication   Communication: No difficulties  Cognition Arousal/Alertness: Awake/alert Behavior During Therapy: WFL for tasks assessed/performed Overall Cognitive Status: Within Functional Limits for tasks assessed                                        General Comments General comments (skin integrity, edema, etc.): L hip dressing intact; minimal drainage noted.  Nursing cleared pt for participation in physical therapy.  Pt agreeable to PT session.    Exercises  Deferred d/t pt needing to toilet upon PT arrival.   Assessment/Plan    PT Assessment Patient needs continued PT services  PT Problem List Decreased strength;Decreased activity tolerance;Decreased balance;Decreased mobility;Decreased knowledge of use of DME;Decreased knowledge of precautions;Pain;Decreased skin integrity       PT Treatment Interventions DME instruction;Gait training;Stair training;Functional mobility training;Therapeutic activities;Therapeutic exercise;Balance training;Patient/family education    PT Goals (Current goals can be found in the Care Plan section)  Acute Rehab PT Goals Patient Stated Goal: to go home and have less L hip pain PT Goal Formulation: With patient Time For Goal Achievement: 01/22/19 Potential to Achieve Goals: Good    Frequency BID   Barriers to discharge         Co-evaluation               AM-PAC PT "6 Clicks" Mobility  Outcome Measure Help needed turning from your back to your side while in a flat bed without using bedrails?: A Little Help needed moving from lying on your back to sitting on the side of a flat bed without using bedrails?: A Little Help needed moving to and from a bed to a chair (including a wheelchair)?: A Little Help needed standing up from a chair using your arms (e.g., wheelchair or bedside chair)?: A Little Help needed to walk in hospital room?: A Little Help needed climbing 3-5 steps with a railing? : A Little 6 Click Score: 18    End of Session Equipment Utilized During Treatment: Gait belt Activity Tolerance: Patient limited by pain Patient left: in chair;with call bell/phone within reach;with chair alarm set;with SCD's reapplied(pillows between pt's knees and B heels floating via pillows) Nurse Communication: Mobility status;Patient requests pain meds;Precautions;Weight bearing status(via secure text (nurse not available by phone)) PT Visit Diagnosis: Other abnormalities of gait and mobility (R26.89);Muscle weakness (generalized) (M62.81);Difficulty in walking, not elsewhere classified (R26.2);Pain Pain - part of body: Hip    Time: 5176-1607 PT Time Calculation (min) (ACUTE ONLY): 41 min   Charges:   PT Evaluation $PT Eval Low Complexity: 1 Low PT Treatments $Therapeutic Activity:  23-37 mins       Leitha Bleak, PT 01/08/19, 4:13 PM [phone]

## 2019-01-08 NOTE — Anesthesia Post-op Follow-up Note (Signed)
Anesthesia QCDR form completed.        

## 2019-01-08 NOTE — Anesthesia Preprocedure Evaluation (Signed)
Anesthesia Evaluation  Patient identified by MRN, date of birth, ID band Patient awake    Reviewed: Allergy & Precautions, H&P , NPO status , Patient's Chart, lab work & pertinent test results  History of Anesthesia Complications Negative for: history of anesthetic complications  Airway Mallampati: III  TM Distance: <3 FB Neck ROM: limited    Dental  (+) Missing, Poor Dentition, Edentulous Upper, Edentulous Lower, Upper Dentures, Lower Dentures   Pulmonary neg pulmonary ROS, neg shortness of breath,           Cardiovascular Exercise Tolerance: Good hypertension, (-) angina+ Peripheral Vascular Disease  (-) Past MI and (-) DOE      Neuro/Psych negative neurological ROS  negative psych ROS   GI/Hepatic Neg liver ROS, GERD  Controlled and Medicated,  Endo/Other  diabetes, Type 2  Renal/GU negative Renal ROS  negative genitourinary   Musculoskeletal  (+) Arthritis ,   Abdominal   Peds  Hematology negative hematology ROS (+)   Anesthesia Other Findings Past Medical History: No date: Arthritis No date: Diabetes mellitus without complication (HCC) No date: GERD (gastroesophageal reflux disease) No date: Hyperlipidemia  Past Surgical History: No date: APPENDECTOMY No date: PARTIAL HIP ARTHROPLASTY; Right No date: TONSILLECTOMY  BMI    Body Mass Index:  36.66 kg/m      Reproductive/Obstetrics negative OB ROS                             Anesthesia Physical  Anesthesia Plan  ASA: III  Anesthesia Plan: Spinal   Post-op Pain Management:    Induction: Intravenous  PONV Risk Score and Plan: Propofol infusion and TIVA  Airway Management Planned: Natural Airway and Simple Face Mask  Additional Equipment:   Intra-op Plan:   Post-operative Plan:   Informed Consent: I have reviewed the patients History and Physical, chart, labs and discussed the procedure including the risks,  benefits and alternatives for the proposed anesthesia with the patient or authorized representative who has indicated his/her understanding and acceptance.     Dental Advisory Given  Plan Discussed with: Anesthesiologist, CRNA and Surgeon  Anesthesia Plan Comments: (Patient consented for risks of anesthesia including but not limited to:  - adverse reactions to medications - risk of intubation if required - damage to teeth, lips or other oral mucosa - sore throat or hoarseness - Damage to heart, brain, lungs or loss of life  Patient voiced understanding.)        Anesthesia Quick Evaluation

## 2019-01-08 NOTE — Op Note (Addendum)
01/08/2019  9:54 AM  PATIENT:  Orlinda Slomski   MRN: [id number]  PRE-OPERATIVE DIAGNOSIS:  Osteoarthritis left hip   POST-OPERATIVE DIAGNOSIS: Same  Procedure: left Total Hip Replacement  Surgeon: Elyn Aquas. Harlow Mares, MD   Assist: Floyce Stakes, RFNA Assist: Carlynn Spry, PA-C  Anesthesia: Spinal   EBL: 100 mL   Specimens: None   Drains: None   Components used: A size 1 Polarstem [employer], R3 size 48 mm shell, and a 32 mm +0 mm head    Description of the procedure in detail: After informed consent was obtained and the appropriate extremity marked in the pre-operative holding area, the patient was taken to the operating room and placed in the supine position on the fracture table. All pressure points were well padded and bilateral lower extremities were place in traction spars. The hip was prepped and draped in standard sterile fashion. A spinal anesthetic had been delivered by the anesthesia team. The skin and subcutaneous tissues were injected with a mixture of Marcaine with epinephrine for post-operative pain. A longitudinal incision approximately 10 cm in length was carried out from the anterior superior iliac spine to the greater trochanter. The tensor fascia was divided and blunt dissection was taken down to the level of the joint capsule. The lateral circumflex vessels were cauterized. Deep retractors were placed and a portion of the anterior capsule was excised. Using fluoroscopy the neck cut was planned and carried out with a sagittal saw. The head was passed from the field with use of a corkscrew and hip skid. Deep retractors were placed along the acetabulum and the degenerative labrum and large osteophytes were removed with a Rongeur. The cup was sequentially reamed to a size 48 mm. The wound was irrigated and using fluoroscopy the size 48 mm cup was impacted in to anatomic position. A single screw was placed followed by a threaded hole cover. The final liner was impacted  in to position. Attention was then turned to the proximal femur. The leg was placed in extension and external rotation. The canal was opened and sequentially broached to a size 1. The trial components were placed and the hip relocated. The components were found to be in good position using fluoroscopy. The hip was dislocated and the trial components removed. The final components were impacted in to position and the hip relocated. The final components were again check with fluoroscopy and found to be in good position. Hemostasis was achieved with electrocautery. The deep capsule was injected with Marcaine and epinephrine. The wound was irrigated with bacitracin laced normal saline and the tensor fascia closed with #2 Quill suture. The subcutaneous tissues were closed with 2-0 vicryl and staples for the skin. A sterile dressing was applied and an abduction pillow. Patient tolerated the procedure well and there were no apparent complication. Patient was taken to the recovery room in good condition.   Kurtis Bushman, MD

## 2019-01-08 NOTE — H&P (Signed)
The patient has been re-examined, and the chart reviewed, and there have been no interval changes to the documented history and physical.  Plan a left total hip replacement today.  Anesthesia is not consulted regarding a peripheral nerve block for post-operative pain.  The risks, benefits, and alternatives have been discussed at length, and the patient is willing to proceed.

## 2019-01-08 NOTE — Anesthesia Procedure Notes (Signed)
Spinal  Patient location during procedure: OR Start time: 01/08/2019 7:50 AM Staffing Anesthesiologist: Martha Clan, MD Resident/CRNA: Kadeen Sroka, Einar Grad, CRNA Performed: resident/CRNA  Preanesthetic Checklist Completed: patient identified, site marked, surgical consent, pre-op evaluation, timeout performed, IV checked, risks and benefits discussed and monitors and equipment checked Spinal Block Patient position: sitting Prep: DuraPrep Patient monitoring: heart rate, cardiac monitor, continuous pulse ox and blood pressure Approach: midline Location: L3-4 Injection technique: single-shot Needle Needle type: Sprotte  Needle gauge: 24 G Needle length: 9 cm Assessment Sensory level: T4

## 2019-01-08 NOTE — Progress Notes (Signed)
15 minute call to floor. 

## 2019-01-08 NOTE — Transfer of Care (Signed)
Immediate Anesthesia Transfer of Care Note  Patient: Kathleen Tran  Procedure(s) Performed: TOTAL HIP ARTHROPLASTY ANTERIOR APPROACH (Left Hip)  Patient Location: PACU  Anesthesia Type:Spinal  Level of Consciousness: awake, alert  and oriented  Airway & Oxygen Therapy: Patient Spontanous Breathing  Post-op Assessment: Report given to RN and Post -op Vital signs reviewed and stable  Post vital signs: Reviewed and stable  Last Vitals:  Vitals Value Taken Time  BP    Temp    Pulse    Resp    SpO2      Last Pain:  Vitals:   01/08/19 0613  TempSrc: Oral  PainSc: 10-Worst pain ever      Patients Stated Pain Goal: 3 (48/54/62 7035)  Complications: No apparent anesthesia complications

## 2019-01-09 ENCOUNTER — Encounter: Payer: Self-pay | Admitting: Orthopedic Surgery

## 2019-01-09 LAB — BASIC METABOLIC PANEL
Anion gap: 4 — ABNORMAL LOW (ref 5–15)
BUN: 15 mg/dL (ref 8–23)
CO2: 29 mmol/L (ref 22–32)
Calcium: 8.8 mg/dL — ABNORMAL LOW (ref 8.9–10.3)
Chloride: 106 mmol/L (ref 98–111)
Creatinine, Ser: 0.47 mg/dL (ref 0.44–1.00)
GFR calc Af Amer: >60 mL/min (ref >60)
GFR calc non Af Amer: >60 mL/min (ref >60)
Glucose, Bld: 128 mg/dL — ABNORMAL HIGH (ref 70–99)
Potassium: 4.2 mmol/L (ref 3.5–5.1)
Sodium: 139 mmol/L (ref 135–145)

## 2019-01-09 LAB — SURGICAL PATHOLOGY

## 2019-01-09 LAB — CBC
HCT: 33.2 % — ABNORMAL LOW (ref 36.0–46.0)
Hemoglobin: 10.5 g/dL — ABNORMAL LOW (ref 12.0–15.0)
MCH: 30.7 pg (ref 26.0–34.0)
MCHC: 31.6 g/dL (ref 30.0–36.0)
MCV: 97.1 fL (ref 80.0–100.0)
Platelets: 215 10*3/uL (ref 150–400)
RBC: 3.42 MIL/uL — ABNORMAL LOW (ref 3.87–5.11)
RDW: 13.3 % (ref 11.5–15.5)
WBC: 6.3 10*3/uL (ref 4.0–10.5)
nRBC: 0 % (ref 0.0–0.2)

## 2019-01-09 NOTE — Progress Notes (Signed)
Physical Therapy Treatment Patient Details Name: Kathleen Tran MRN: [id number] DOB: 12/02/1940 Today's Date: 01/09/2019    History of Present Illness Pt is a 78 y.o. 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  PMH includes DM type 2, h/o R partial hip arthroplasty, htn, PVD.    PT Comments    Pt reporting 3/10 L hip pain at rest beginning of session and 0/10 at rest end of session.  Tolerated LE ex's in bed fairly well.  Able to stand with CGA up to walker and progress to ambulating 50 feet with youth sized RW.  Limited distance ambulating d/t pt feeling drowsy (pt reporting d/t combination of pain meds and not sleeping well last night).  Will continue to focus on progressing pt with strengthening, trial stairs when appropriate, and increasing ambulation distance per pt tolerance.   Follow Up Recommendations  Follow surgeon's recommendation for DC plan and follow-up therapies     Equipment Recommendations  Rolling walker with 5" wheels;3in1 (PT)    Recommendations for Other Services OT consult     Precautions / Restrictions Precautions Precautions: Anterior Hip;Fall Precaution Booklet Issued: Yes (comment) Restrictions Weight Bearing Restrictions: Yes LLE Weight Bearing: Weight bearing as tolerated    Mobility  Bed Mobility Overal bed mobility: Needs Assistance Bed Mobility: Supine to Sit     Supine to sit: Min assist     General bed mobility comments: bed flat; increased effort to perform; min assist for trunk  Transfers Overall transfer level: Needs assistance Equipment used: Rolling walker (2 wheeled)(youth sized) Transfers: Sit to/from Stand Sit to Stand: Min guard         General transfer comment: vc's required for UE/LE placement; fairly strong stand from bed; controlled descent sitting in chair with vc's to reach back for chair arms when sitting  Ambulation/Gait Ambulation/Gait assistance: Min guard Gait Distance (Feet): 50 Feet Assistive device: Rolling  walker (2 wheeled)(youth sized) Gait Pattern/deviations: Step-to pattern Gait velocity: decreased   General Gait Details: vc's to increase UE support through RW to offweight L LE when advancing R LE; decreased B LE step length; antalgic   Stairs             Wheelchair Mobility    Modified Rankin (Stroke Patients Only)       Balance Overall balance assessment: Needs assistance Sitting-balance support: No upper extremity supported;Feet supported Sitting balance-Leahy Scale: Good Sitting balance - Comments: steady sitting reaching within BOS   Standing balance support: No upper extremity supported Standing balance-Leahy Scale: Good Standing balance comment: steady standing reaching within BOS                            Cognition Arousal/Alertness: Awake/alert Behavior During Therapy: WFL for tasks assessed/performed Overall Cognitive Status: Within Functional Limits for tasks assessed                                        Exercises Total Joint Exercises Ankle Circles/Pumps: AROM;Strengthening;Both;10 reps;Supine Quad Sets: AROM;Strengthening;Left;10 reps;Supine Gluteal Sets: AROM;Strengthening;Both;10 reps;Supine Towel Squeeze: AROM;Strengthening;Both;10 reps;Supine(pillow between pt's knees) Short Arc Quad: AROM;Strengthening;Left;10 reps;Supine Heel Slides: AAROM;Strengthening;Left;10 reps;Supine    General Comments General comments (skin integrity, edema, etc.): L hip dressing intact; minimal drainage noted.  Pt agreeable to PT session.      Pertinent Vitals/Pain Pain Assessment: 0-10 Pain Score: 0-No pain Pain Location: L hip Pain  Intervention(s): Limited activity within patient's tolerance;Monitored during session;Repositioned  HR WFL during session's activities.    Home Living                      Prior Function            PT Goals (current goals can now be found in the care plan section) Acute Rehab PT  Goals Patient Stated Goal: to go home and have less L hip pain PT Goal Formulation: With patient Time For Goal Achievement: 01/22/19 Potential to Achieve Goals: Good Progress towards PT goals: Progressing toward goals    Frequency    BID      PT Plan Current plan remains appropriate    Co-evaluation              AM-PAC PT "6 Clicks" Mobility   Outcome Measure  Help needed turning from your back to your side while in a flat bed without using bedrails?: A Little Help needed moving from lying on your back to sitting on the side of a flat bed without using bedrails?: A Little Help needed moving to and from a bed to a chair (including a wheelchair)?: A Little Help needed standing up from a chair using your arms (e.g., wheelchair or bedside chair)?: A Little Help needed to walk in hospital room?: A Little Help needed climbing 3-5 steps with a railing? : A Little 6 Click Score: 18    End of Session Equipment Utilized During Treatment: Gait belt Activity Tolerance: Patient tolerated treatment well Patient left: in chair;with call bell/phone within reach;with chair alarm set;with SCD's reapplied(B heels floating via pillows) Nurse Communication: Mobility status;Precautions;Weight bearing status(via white board) PT Visit Diagnosis: Other abnormalities of gait and mobility (R26.89);Muscle weakness (generalized) (M62.81);Difficulty in walking, not elsewhere classified (R26.2);Pain Pain - Right/Left: Left Pain - part of body: Hip     Time: 2355-7322 PT Time Calculation (min) (ACUTE ONLY): 31 min  Charges:  $Gait Training: 8-22 mins $Therapeutic Exercise: 8-22 mins                     Leitha Bleak, PT 01/09/19, 11:50 AM [phone]

## 2019-01-09 NOTE — Progress Notes (Signed)
  Subjective:  Patient reports pain as mild.    Objective:   VITALS:   Vitals:   01/08/19 2319 01/09/19 0414 01/09/19 0724 01/09/19 1158  BP: 138/63 (!) 130/57 101/63 126/67  Pulse: 66 64 (!) 59 (!) 57  Resp: 18 19    Temp: 98 F (36.7 C) 97.7 F (36.5 C) 97.6 F (36.4 C) 97.6 F (36.4 C)  TempSrc: Oral Oral Oral Oral  SpO2: 98% 97% 97% 99%  Weight:      Height:        PHYSICAL EXAM:  Neurovascular intact Dorsiflexion/Plantar flexion intact Incision: dressing C/D/I No cellulitis present Compartment soft  LABS  Results for orders placed or performed during the hospital encounter of 01/08/19 (from the past 24 hour(s))  CBC     Status: Abnormal   Collection Time: 01/09/19  5:16 AM  Result Value Ref Range   WBC 6.3 4.0 - 10.5 K/uL   RBC 3.42 (L) 3.87 - 5.11 MIL/uL   Hemoglobin 10.5 (L) 12.0 - 15.0 g/dL   HCT 33.2 (L) 36.0 - 46.0 %   MCV 97.1 80.0 - 100.0 fL   MCH 30.7 26.0 - 34.0 pg   MCHC 31.6 30.0 - 36.0 g/dL   RDW 13.3 11.5 - 15.5 %   Platelets 215 150 - 400 K/uL   nRBC 0.0 0.0 - 0.2 %  Basic metabolic panel     Status: Abnormal   Collection Time: 01/09/19  5:16 AM  Result Value Ref Range   Sodium 139 135 - 145 mmol/L   Potassium 4.2 3.5 - 5.1 mmol/L   Chloride 106 98 - 111 mmol/L   CO2 29 22 - 32 mmol/L   Glucose, Bld 128 (H) 70 - 99 mg/dL   BUN 15 8 - 23 mg/dL   Creatinine, Ser 0.47 0.44 - 1.00 mg/dL   Calcium 8.8 (L) 8.9 - 10.3 mg/dL   GFR calc non Af Amer >60 >60 mL/min   GFR calc Af Amer >60 >60 mL/min   Anion gap 4 (L) 5 - 15    Dg Pelvis Portable  Result Date: 01/08/2019 CLINICAL DATA:  Total left hip arthroplasty. EXAM: PORTABLE PELVIS 1-2 VIEWS COMPARISON:  None. 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 No complicating features. Remote total right hip arthroplasty. IMPRESSION: Well seated components of a total left hip arthroplasty without complicating features. Electronically Signed   By: Marijo Sanes M.D.   On:  01/08/2019 10:45   Dg Hip Operative Unilat W Or W/o Pelvis Left  Result Date: 01/08/2019 CLINICAL DATA:  Total left hip replacement EXAM: OPERATIVE left HIP (WITH PELVIS IF PERFORMED) 2 VIEWS fluoroscopic time 19 seconds TECHNIQUE: Fluoroscopic spot image(s) were submitted for interpretation post-operatively. COMPARISON:  None. FINDINGS: Two fluoroscopic images demonstrate total left hip replacement without malalignment. IMPRESSION: Left hip replacement without malalignment. Electronically Signed   By: Abelardo Diesel M.D.   On: 01/08/2019 09:43    Assessment/Plan: 1 Day Post-Op   Active Problems:   Osteoarthritis of right hip   Up with therapy Plan for discharge tomorrow   Lovell Sheehan , MD 01/09/2019, 12:34 PM

## 2019-01-09 NOTE — TOC Initial Note (Signed)
Transition of Care [facility]) - Initial/Assessment Note    Patient Details  Name: Kathleen Tran MRN: [id number] Date of Birth: 1941/05/09  [facility] CM/SW Contact:    Su Hilt, RN Phone Number: 01/09/2019, 10:33 AM  Clinical Narrative:                 Met with the patient to discuss DC plan and needs She lives at home with her spouse and son, her friend provides her with transportation She needs a RW and BSC, notified Brad with [employer] She wants to go to Outpatient PT and OT and already has appointments scheduled She can afford her medications and is up to date with her PCP No further needs  Expected Discharge Plan: [place] Barriers to Discharge: Barriers Resolved   Patient Goals and CMS Choice Patient states their goals for this hospitalization and ongoing recovery are:: go home   Choice offered to / list presented to : Patient  Expected Discharge Plan and Services Expected Discharge Plan: [place]   Discharge Planning Services: CM Consult Post Acute Care Choice: [place] arrangements for the past 2 months: Single Family Home                 DME Arranged: 3-N-1, [facility] [employer] DME Agency: [employer] Date DME Agency Contacted: 01/09/19 Time DME Agency Contacted: [phone] Representative spoke with at DME Agency: [facility] HH Arranged: Refused HH          Prior Living Arrangements/Services Living arrangements for the past 2 months: Single Family Home Lives with:: Spouse, Adult Children Patient language and need for interpreter reviewed:: No Do you feel safe going back to the place where you live?: Yes      Need for Family Participation in Patient Care: No (Comment) Care giver support system in place?: Yes (comment)   Criminal Activity/Legal Involvement Pertinent to Current Situation/Hospitalization: No - Comment as needed  Activities of Daily Living Home Assistive Devices/Equipment: Cane (specify quad or  straight) ADL Screening (condition at time of admission) Patient's cognitive ability adequate to safely complete daily activities?: Yes Is the patient deaf or have difficulty hearing?: Yes Does the patient have difficulty seeing, even when wearing glasses/contacts?: No Does the patient have difficulty concentrating, remembering, or making decisions?: No Patient able to express need for assistance with ADLs?: Yes Does the patient have difficulty dressing or bathing?: No Independently performs ADLs?: Yes (appropriate for developmental age) Does the patient have difficulty walking or climbing stairs?: Yes Weakness of Legs: Left Weakness of Arms/Hands: None  Permission Sought/Granted Permission sought to share information with : Case Manager                Emotional Assessment Appearance:: Appears stated age Attitude/Demeanor/Rapport: Engaged, Gracious Affect (typically observed): Accepting, Pleasant, Hopeful, Happy Orientation: : Oriented to Self, Oriented to Place, Oriented to  Time, Oriented to Situation Alcohol / Substance Use: Not Applicable Psych Involvement: No (comment)  Admission diagnosis:  M16.12 unilateral primary osteoarthirits left hip Patient Active Problem List   Diagnosis Date Noted  . Osteoarthritis of right hip 01/08/2019  . Onychomycosis of multiple toenails with type 2 diabetes mellitus and peripheral angiopathy ([place]) 08/12/2018  . GERD (gastroesophageal reflux disease) 02/20/2018  . Positive colorectal cancer screening using Cologuard test   . Benign neoplasm of cecum   . Benign neoplasm of ascending colon   . Benign neoplasm of descending colon   . External hemorrhoids   .  Arthritis 04/12/2015  . Type 2 diabetes mellitus with vascular disease ([place]) 04/12/2015  . Osteoporosis 04/12/2015  . Hypercholesterolemia 04/12/2015  . Essential hypertension 04/12/2015   PCP:  Mar Daring, PA-C Pharmacy:   [id number] [address], [place] [address] Phone: [phone] Fax: [phone]     Social Determinants of Health (SDOH) Interventions    Readmission Risk Interventions No flowsheet data found.

## 2019-01-09 NOTE — Anesthesia Postprocedure Evaluation (Signed)
Anesthesia Post Note  Patient: Dentist  Procedure(s) Performed: TOTAL HIP ARTHROPLASTY ANTERIOR APPROACH (Left Hip)  Patient location during evaluation: Nursing Unit Anesthesia Type: Spinal Level of consciousness: awake, awake and alert and patient cooperative Pain management: pain level controlled Vital Signs Assessment: post-procedure vital signs reviewed and stable Respiratory status: spontaneous breathing and respiratory function stable Cardiovascular status: stable Postop Assessment: no headache, no backache, patient able to bend at knees, adequate PO intake and no apparent nausea or vomiting Anesthetic complications: no     Last Vitals:  Vitals:   01/09/19 0414 01/09/19 0724  BP: (!) 130/57 101/63  Pulse: 64 (!) 59  Resp: 19   Temp: 36.5 C 36.4 C  SpO2: 97% 97%    Last Pain:  Vitals:   01/09/19 0724  TempSrc: Oral  PainSc:                  Ricki Miller

## 2019-01-09 NOTE — Progress Notes (Signed)
Physical Therapy Treatment Patient Details Name: Kathleen Tran MRN: [id number] DOB: Aug 18, 1940 Today's Date: 01/09/2019    History of Present Illness Pt is a 78 y.o. 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  PMH includes DM type 2, h/o R partial hip arthroplasty, htn, PVD.    PT Comments    Pt reporting no pain at rest but L hip pain increased to 8/10 with activity; decreased to 0/10 at rest in bed end of session.  Pt able to progress to ambulating 100 feet with youth sized RW CGA but towards end of ambulation (when pt was almost to bed), pt's B knees buckled requiring min assist of therapist to maintain standing (pt using walker to also maintain upright posture).  Pt reports h/o B knees buckling (although never knows when it is going to happen) but never falling. Will continue to focus on strengthening, increasing ambulation distance, and will attempt stairs next session as appropriate.   Follow Up Recommendations  Follow surgeon's recommendation for DC plan and follow-up therapies     Equipment Recommendations  Rolling walker with 5" wheels;3in1 (PT)(youth sized)    Recommendations for Other Services OT consult     Precautions / Restrictions Precautions Precautions: Anterior Hip;Fall Precaution Booklet Issued: Yes (comment) Restrictions Weight Bearing Restrictions: Yes LLE Weight Bearing: Weight bearing as tolerated    Mobility  Bed Mobility Overal bed mobility: Needs Assistance Bed Mobility: Sit to Supine       Sit to supine: Mod assist;Max assist   General bed mobility comments: bed flat; assist for trunk and B LE's d/t pt fatigue post ambulation  Transfers Overall transfer level: Needs assistance Equipment used: Rolling walker (2 wheeled) Transfers: Sit to/from [facility] Sit to Stand: Min guard Stand pivot transfers: Min guard       General transfer comment: vc's required for UE/LE placement; fairly strong stand from recliner and BSC over toilet;  controlled descent sitting on BSC and on bed with UE support  Ambulation/Gait Ambulation/Gait assistance: Min guard;Min assist Gait Distance (Feet): 100 Feet Assistive device: Rolling walker (2 wheeled)(youth sized) Gait Pattern/deviations: Step-to pattern Gait velocity: decreased   General Gait Details: initial vc's to increase UE support through RW to offweight L LE when advancing R LE; decreased B LE step length; antalgic; limited distance d/t fatigue; pt's B knees buckling end of ambulation after just getting close to bed requiring min assist to maintain upright   Stairs             Wheelchair Mobility    Modified Rankin (Stroke Patients Only)       Balance Overall balance assessment: Needs assistance Sitting-balance support: No upper extremity supported;Feet supported Sitting balance-Leahy Scale: Good Sitting balance - Comments: steady sitting reaching within BOS   Standing balance support: No upper extremity supported Standing balance-Leahy Scale: Good Standing balance comment: steady standing washing hands at sink                            Cognition Arousal/Alertness: Awake/alert Behavior During Therapy: WFL for tasks assessed/performed Overall Cognitive Status: Within Functional Limits for tasks assessed                                        Exercises General Exercises - Lower Extremity Long Arc Quad: AROM;Strengthening;Both;10 reps;Seated Hip Flexion/Marching: AROM;Right;AAROM;Left;10 reps;Strengthening;Seated    General Comments  Pt agreeable to PT session.      Pertinent Vitals/Pain Pain Assessment: 0-10 Pain Score: 0-No pain(8/10 with activity) Pain Location: L hip Pain Intervention(s): Limited activity within patient's tolerance;Monitored during session;Repositioned  HR WFL beginning/end of session.    Home Living                      Prior Function            PT Goals (current goals can now be  found in the care plan section) Acute Rehab PT Goals Patient Stated Goal: to go home and have less L hip pain PT Goal Formulation: With patient Time For Goal Achievement: 01/22/19 Potential to Achieve Goals: Good Progress towards PT goals: Progressing toward goals    Frequency    BID      PT Plan Current plan remains appropriate    Co-evaluation              AM-PAC PT "6 Clicks" Mobility   Outcome Measure  Help needed turning from your back to your side while in a flat bed without using bedrails?: A Little Help needed moving from lying on your back to sitting on the side of a flat bed without using bedrails?: A Little Help needed moving to and from a bed to a chair (including a wheelchair)?: A Little Help needed standing up from a chair using your arms (e.g., wheelchair or bedside chair)?: A Little Help needed to walk in hospital room?: A Little Help needed climbing 3-5 steps with a railing? : A Little 6 Click Score: 18    End of Session Equipment Utilized During Treatment: Gait belt Activity Tolerance: Patient limited by fatigue Patient left: in bed;with call bell/phone within reach;with bed alarm set;with SCD's reapplied(B heels floating via pillows) Nurse Communication: Mobility status;Precautions;Weight bearing status(via white board) PT Visit Diagnosis: Other abnormalities of gait and mobility (R26.89);Muscle weakness (generalized) (M62.81);Difficulty in walking, not elsewhere classified (R26.2);Pain Pain - Right/Left: Left Pain - part of body: Hip     Time: 1520-1601 PT Time Calculation (min) (ACUTE ONLY): 41 min  Charges:  $Gait Training: 8-22 mins $Therapeutic Exercise: 8-22 mins $Therapeutic Activity: 8-22 mins                    Leitha Bleak, PT 01/09/19, 4:18 PM [phone]

## 2019-01-09 NOTE — Progress Notes (Signed)
Pt accidentally pulled out IV access. This RN offered to insert a new IV and explained the benefits of a readily accessible peripheral line. Despite of, pt refused for a new IV and stated that "I really don't want it if there is no more going in it."

## 2019-01-10 LAB — CBC
HCT: 32.2 % — ABNORMAL LOW (ref 36.0–46.0)
Hemoglobin: 10.3 g/dL — ABNORMAL LOW (ref 12.0–15.0)
MCH: 30.8 pg (ref 26.0–34.0)
MCHC: 32 g/dL (ref 30.0–36.0)
MCV: 96.4 fL (ref 80.0–100.0)
Platelets: 209 10*3/uL (ref 150–400)
RBC: 3.34 MIL/uL — ABNORMAL LOW (ref 3.87–5.11)
RDW: 13.3 % (ref 11.5–15.5)
WBC: 8 10*3/uL (ref 4.0–10.5)
nRBC: 0 % (ref 0.0–0.2)

## 2019-01-10 MED ORDER — TRAMADOL HCL 50 MG PO TABS: 50.0000 mg | ORAL_TABLET | Freq: Four times a day (QID) | ORAL | 0 refills | Status: DC

## 2019-01-10 MED ORDER — DOCUSATE SODIUM 100 MG PO CAPS: 100.0000 mg | ORAL_CAPSULE | Freq: Two times a day (BID) | ORAL | 0 refills | Status: DC

## 2019-01-10 MED ORDER — ASPIRIN 81 MG PO CHEW: 81.0000 mg | CHEWABLE_TABLET | Freq: Two times a day (BID) | ORAL | 0 refills | Status: DC

## 2019-01-10 MED ORDER — ONDANSETRON HCL 4 MG PO TABS: 4.0000 mg | ORAL_TABLET | Freq: Four times a day (QID) | ORAL | 0 refills | Status: DC | PRN

## 2019-01-10 NOTE — Discharge Summary (Addendum)
Physician Discharge Summary  Patient ID: Kathleen Tran MRN: [id number] DOB/AGE: Aug 15, 1940 78 y.o.  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 Arthritis   . Diabetes mellitus without complication ([place])   . Dyspnea   . GERD (gastroesophageal reflux disease)   . Hyperlipidemia   . Hypertension   . Tremors of nervous system    HEAD    Surgeries: Procedure(s): LEFT TOTAL HIP ARTHROPLASTY ANTERIOR APPROACH on 01/08/2019   Consultants (if any):   Discharged Condition: Improved  Hospital Course: Kathleen Tran is an 78 y.o. 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    She was given perioperative antibiotics:  Anti-infectives (From admission, onward)   Start     Dose/Rate Route Frequency Ordered Stop   01/08/19 1115  ceFAZolin (ANCEF) IVPB 2g/100 mL premix     2 g 200 mL/hr over 30 Minutes Intravenous Every 6 hours 01/08/19 1103 01/08/19 2314   01/08/19 0723  50,000 units bacitracin in 0.9% normal saline 250 mL irrigation  Status:  Discontinued       As needed 01/08/19 0724 01/08/19 0952   01/08/19 0631  ceFAZolin (ANCEF) 2-4 GM/100ML-% IVPB    Note to Pharmacy: Sylvester Harder   : cabinet override      01/08/19 0631 01/08/19 0812   01/08/19 0600  ceFAZolin (ANCEF) IVPB 2g/100 mL premix     2 g 200 mL/hr over 30 Minutes Intravenous On call to O.R. 01/07/19 2145 01/08/19 2706    .  She was given sequential compression devices, early ambulation, and aspirin for DVT prophylaxis.  She benefited maximally from the hospital stay and  there were no complications.    Recent vital signs:  Vitals:   01/09/19 2312 01/10/19 0746  BP: (!) 131/45 (!) 118/53  Pulse: 78 84  Resp: 18 17  Temp: 98.9 F (37.2 C) 98.3 F (36.8 C)  SpO2: 98% 96%    Recent laboratory studies:  Lab Results  Component Value Date   HGB 10.3 (L) 01/10/2019   HGB 10.5 (L) 01/09/2019   HGB 12.7 01/01/2019   Lab Results  Component Value Date   WBC 8.0 01/10/2019   PLT 209 01/10/2019   Lab Results  Component Value Date   INR 0.9 01/01/2019   Lab Results  Component Value Date   NA 139 01/09/2019   K 4.2 01/09/2019   CL 106 01/09/2019   CO2 29 01/09/2019   BUN 15 01/09/2019   CREATININE 0.47 01/09/2019   GLUCOSE 128 (H) 01/09/2019    Discharge Medications:   Allergies as of 01/10/2019      Reactions   Citrus Other (See Comments)   Rash in mouth and on arms      Medication List    STOP taking these medications   aspirin 81 MG tablet Replaced by: aspirin 81 MG chewable tablet     TAKE these medications   acetaminophen 500 MG tablet Commonly known as: TYLENOL Take 1,000 mg by mouth daily as needed for moderate pain or headache.   aspirin 81 MG chewable tablet Chew 1 tablet (81 mg total) by mouth 2 (  two) times daily. Replaces: aspirin 81 MG tablet   CALCIUM + D PO Take 3 tablets by mouth daily. 1000 mg   docusate sodium 100 MG capsule Commonly known as: COLACE Take 1 capsule (100 mg total) by mouth 2 (two) times daily.   FISH OIL PO Take 1,600 mg by mouth daily.   gabapentin 400 MG capsule Commonly known as: NEURONTIN TAKE 1 CAPSULE(400 MG) BY MOUTH THREE TIMES DAILY What changed: See the new instructions.   glipiZIDE 5 MG tablet Commonly known as: GLUCOTROL TAKE 1/2 TABLET BY MOUTH EVERY DAY BEFORE SUPPER What changed: See the new instructions.   losartan 50 MG tablet Commonly known as: COZAAR Take 1 tablet (50 mg total) by mouth every evening.   metFORMIN 1000 MG tablet Commonly known as:  GLUCOPHAGE TAKE 1 TABLET(1000 MG) BY MOUTH TWICE DAILY WITH A MEAL What changed: See the new instructions.   MULTI VITAMIN DAILY PO Take 2 tablets by mouth daily.   ondansetron 4 MG tablet Commonly known as: ZOFRAN Take 1 tablet (4 mg total) by mouth every 6 (six) hours as needed for nausea.   OneTouch Delica Lancets Fine Misc USE TO CHECK BLOOD SUGAR ONCE TO TWICE DAILY   OneTouch Verio IQ System w/Device Kit Check blood sugar a.c.h.s.   OneTouch Verio test strip Generic drug: glucose blood USE TO CHECK BLOOD SUGAR EVERY DAY   pravastatin 80 MG tablet Commonly known as: PRAVACHOL Take 1 tablet (80 mg total) by mouth every evening.   traMADol 50 MG tablet Commonly known as: ULTRAM Take 1 tablet (50 mg total) by mouth every 6 (six) hours.   vitamin B-12 1000 MCG tablet Commonly known as: CYANOCOBALAMIN Take 1,000 mcg by mouth daily after breakfast.            Durable Medical Equipment  (From admission, onward)         Start     Ordered   01/09/19 1227  For home use only DME Walker rolling  Once    Question:  Patient needs a walker to treat with the following condition  Answer:  Status post THR (total hip replacement)   01/09/19 1226   01/09/19 1227  For home use only DME Bedside commode  Once    Question:  Patient needs a bedside commode to treat with the following condition  Answer:  Status post THR (total hip replacement)   01/09/19 1226          Diagnostic Studies: Dg Pelvis Portable  Result Date: 01/08/2019 CLINICAL DATA:  Total left hip arthroplasty. EXAM: PORTABLE PELVIS 1-2 VIEWS COMPARISON:  None. 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 No complicating features. Remote total right hip arthroplasty. IMPRESSION: Well seated components of a total left hip arthroplasty without complicating features. Electronically Signed   By: Marijo Sanes M.D.   On: 01/08/2019 10:45   Dg Hip Operative Unilat W Or W/o Pelvis Left  Result Date:  01/08/2019 CLINICAL DATA:  Total left hip replacement EXAM: OPERATIVE left HIP (WITH PELVIS IF PERFORMED) 2 VIEWS fluoroscopic time 19 seconds TECHNIQUE: Fluoroscopic spot image(s) were submitted for interpretation post-operatively. COMPARISON:  None. FINDINGS: Two fluoroscopic images demonstrate total left hip replacement without malalignment. IMPRESSION: Left hip replacement without malalignment. Electronically Signed   By: Abelardo Diesel M.D.   On: 01/08/2019 09:43    Disposition: Discharge disposition: 01-Home or Self Care            Signed: Carlynn Spry ,PA-C 01/10/2019, 8:59  AM    

## 2019-01-10 NOTE — Discharge Instructions (Signed)

## 2019-01-10 NOTE — Progress Notes (Signed)
Physical Therapy Treatment Patient Details Name: Verdean Murin MRN: [id number] DOB: December 24, 1940 Today's Date: 01/10/2019    History of Present Illness Pt is a 78 y.o. 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  PMH includes DM type 2, h/o R partial hip arthroplasty, htn, PVD.    PT Comments    Upon PT arrival, pt sitting in recliner dressed and reporting ready to go home.  Pt CGA with transfers and ambulation 60 feet with youth sized RW.  No knee buckling noted during session's activities and pt appearing stronger in general compared to earlier session.  Limited distance ambulating per pt request (to not overdo it prior to discharge home) and also d/t general fatigue.  No drowsiness noted during session.  Pt requesting to discharge home.  Educated pt on activity modification and energy conservation strategies; pt issued gait belt (educated on proper use) and pt educated on recommendation for 1 assist with ambulation for safety (with walker use) and 2nd assist for chair follow (pt reported not having a good chair for this so educated pt on appropriate BSC height adjustment for pt and that pt could use that as a portable chair)--pt reports her son can assist her with walking and husband can bring BSC to use as needed for sitting rest break.  Pt has level entry home and plans to stay on main floor so she does not have any stairs to do (will sleep in recliner).  Pt reports having 24/7 assist and has no further questions for physical therapist prior to hospital discharge today and pt verbalizing appropriate understanding to above education.  Pt's youth sized walker height adjusted for appropriate fit for discharge home.    Follow Up Recommendations  Follow surgeon's recommendation for DC plan and follow-up therapies;Supervision/Assistance - 24 hour     Equipment Recommendations  Rolling walker with 5" wheels;3in1 (PT)(youth sized)    Recommendations for Other Services OT consult     Precautions /  Restrictions Precautions Precautions: Anterior Hip;Fall Precaution Booklet Issued: Yes (comment) Restrictions Weight Bearing Restrictions: Yes LLE Weight Bearing: Weight bearing as tolerated    Mobility  Bed Mobility               General bed mobility comments: Deferred (pt sitting in chair beginning/end of session)  Transfers Overall transfer level: Needs assistance Equipment used: Rolling walker (2 wheeled) Transfers: Sit to/from [facility] Sit to Stand: Min guard Stand pivot transfers: Min guard       General transfer comment: x2 sit to/from stand trials from recliner and x1 sit to/from stand trial from [facility] (all with youth sized RW use); stand step turn recliner to/from [facility] with CGA and youth sized RW use; no knee buckling noted  Ambulation/Gait Ambulation/Gait assistance: Min guard(chair follow for safety) Gait Distance (Feet): 60 Feet Assistive device: Rolling walker (2 wheeled)(youth sized)   Gait velocity: decreased   General Gait Details: partial step through gait pattern; steady with walker use; no knee buckling noted; limited distance d/t fatigue and pt not wanting to overdo it prior to hospital discharge home   Stairs             Wheelchair Mobility    Modified Rankin (Stroke Patients Only)       Balance Overall balance assessment: Needs assistance Sitting-balance support: No upper extremity supported;Feet supported Sitting balance-Leahy Scale: Normal Sitting balance - Comments: steady sitting reaching outside BOS   Standing balance support: No upper extremity supported Standing balance-Leahy Scale: Good Standing balance comment:  steady standing reaching within BOS                            Cognition Arousal/Alertness: Awake/alert Behavior During Therapy: WFL for tasks assessed/performed Overall Cognitive Status: Within Functional Limits for tasks assessed                                         Exercises      General Comments  Pt agreeable to PT session.      Pertinent Vitals/Pain Pain Assessment: 0-10 Pain Score: 2  Pain Location: L hip Pain Descriptors / Indicators: Sore Pain Intervention(s): Limited activity within patient's tolerance;Monitored during session;Premedicated before session;Repositioned    Home Living                      Prior Function            PT Goals (current goals can now be found in the care plan section) Acute Rehab PT Goals Patient Stated Goal: to go home and have less L hip pain PT Goal Formulation: With patient Time For Goal Achievement: 01/22/19 Potential to Achieve Goals: Good Progress towards PT goals: Progressing toward goals    Frequency    BID      PT Plan Current plan remains appropriate    Co-evaluation              AM-PAC PT "6 Clicks" Mobility   Outcome Measure  Help needed turning from your back to your side while in a flat bed without using bedrails?: A Little Help needed moving from lying on your back to sitting on the side of a flat bed without using bedrails?: A Little Help needed moving to and from a bed to a chair (including a wheelchair)?: A Little Help needed standing up from a chair using your arms (e.g., wheelchair or bedside chair)?: A Little Help needed to walk in hospital room?: A Little Help needed climbing 3-5 steps with a railing? : A Little 6 Click Score: 18    End of Session Equipment Utilized During Treatment: Gait belt Activity Tolerance: Patient limited by fatigue Patient left: in chair;with call bell/phone within reach;with chair alarm set Nurse Communication: Mobility status;Precautions;Weight bearing status PT Visit Diagnosis: Other abnormalities of gait and mobility (R26.89);Muscle weakness (generalized) (M62.81);Difficulty in walking, not elsewhere classified (R26.2);Pain Pain - Right/Left: Left Pain - part of body: Hip     Time: 2878-6767 PT Time Calculation  (min) (ACUTE ONLY): 30 min  Charges:  $Therapeutic Activity: 23-37 mins                    Leitha Bleak, PT 01/10/19, 4:41 PM [phone]

## 2019-01-10 NOTE — Evaluation (Signed)
Occupational Therapy Evaluation Patient Details Name: Kathleen Tran MRN: [id number] DOB: [birth year] Today's Date: 01/10/2019    History of Present Illness Pt is a 78 y.o. 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  PMH includes DM type 2, h/o R partial hip arthroplasty, htn, PVD.   Clinical Impression   Pt seen for OT evaluation this date, POD#1 from above surgery. Pt was independent in all ADL prior to surgery and is eager to return to PLOF with less pain and improved safety and independence. Pt currently requires minimal assist for LB dressing and bathing while in seated position due to pain and limited AROM of R hip. Pt instructed in self care skills, falls prevention strategies, home/routines modifications, DME/AE for LB bathing and dressing tasks, and compression stocking mgt strategies. Handout provided. Pt would benefit from additional instruction in self care skills and techniques to help maintain precautions with or without assistive devices to support recall and carryover prior to discharge.      Follow Up Recommendations  No OT follow up    Equipment Recommendations  3 in 1 bedside commode    Recommendations for Other Services       Precautions / Restrictions Precautions Precautions: Anterior Hip;Fall Precaution Booklet Issued: Yes (comment) Restrictions Weight Bearing Restrictions: Yes LLE Weight Bearing: Weight bearing as tolerated      Mobility Bed Mobility Overal bed mobility: Needs Assistance Bed Mobility: Supine to Sit     Supine to sit: Min assist     General bed mobility comments: deferred, received in recliner  Transfers Overall transfer level: Needs assistance Equipment used: Rolling walker (2 wheeled)(youth walker) Transfers: Sit to/from [employer] Transfers Sit to Stand: Min guard Stand pivot transfers: Min guard;Min assist       General transfer comment: fairly strong stand from bed x2 trials and from [facility] x1 trial; CGA stand step turn bed  to recliner with walker and initially CGA recliner to bed with walker but pt's knees buckled 2x's once getting closer to bed requiring min assist to maintain upright    Balance Overall balance assessment: Needs assistance Sitting-balance support: No upper extremity supported;Feet supported Sitting balance-Leahy Scale: Good Sitting balance - Comments: steady sitting reaching within BOS   Standing balance support: Single extremity supported Standing balance-Leahy Scale: Good Standing balance comment: steady standing performing hygiene post toileting on BSC with single UE support                           ADL either performed or assessed with clinical judgement   ADL Overall ADL's : Needs assistance/impaired                                       General ADL Comments: Min A for LB ADL, CGA for toilet transfer to [facility] with RW     Vision Patient Visual Report: No change from baseline       Perception     Praxis      Pertinent Vitals/Pain Pain Assessment: 0-10 Pain Score: 1  Pain Location: L hip Pain Descriptors / Indicators: Sore;Aching Pain Intervention(s): Limited activity within patient's tolerance;Monitored during session     Hand Dominance     Extremity/Trunk Assessment Upper Extremity Assessment Upper Extremity Assessment: Overall WFL for tasks assessed   Lower Extremity Assessment Lower Extremity Assessment: Defer to PT evaluation;RLE deficits/detail;LLE deficits/detail RLE Deficits / Details:  strength and ROM WFL LLE Deficits / Details: hip flexion at least 2+/5 (limited d/t hip pain); knee flexion/extension and DF at least 3/5 AROM LLE: Unable to fully assess due to pain   Cervical / Trunk Assessment Cervical / Trunk Assessment: Normal   Communication Communication Communication: No difficulties   Cognition Arousal/Alertness: Awake/alert Behavior During Therapy: WFL for tasks assessed/performed Overall Cognitive Status: Within  Functional Limits for tasks assessed                                     General Comments       Exercises Other Exercises Other Exercises: Pt instructed in home/routines modifications, falls prevention strategies, AE/DME; handout provided   Shoulder Instructions      Home Living Family/patient expects to be discharged to:: Private residence Living Arrangements: Spouse/significant other;Children(pt's husband and son) Available Help at Discharge: Family Type of Home: Other(Comment)(Townhome) Home Access: Level entry     Home Layout: Two level;Bed/bath upstairs;1/2 bath on main level(can sleep in recliner on main level) Alternate Level Stairs-Number of Steps: 14 steps Alternate Level Stairs-Rails: Right Bathroom Shower/Tub: Teacher, early years/pre: Standard     Home Equipment: Cane - quad;Cane - single point          Prior Functioning/Environment Level of Independence: Independent with assistive device(s)        Comments: Ambulatory with cane prior to admission.        OT Problem List: Decreased strength;Pain;Decreased range of motion      OT Treatment/Interventions: Self-care/ADL training;Therapeutic exercise;Therapeutic activities;DME and/or AE instruction;Patient/family education    OT Goals(Current goals can be found in the care plan section) Acute Rehab OT Goals Patient Stated Goal: to go home and have less L hip pain OT Goal Formulation: With patient Time For Goal Achievement: 01/24/19 Potential to Achieve Goals: Good  OT Frequency: Min 1X/week   Barriers to D/C:            Co-evaluation              AM-PAC OT "6 Clicks" Daily Activity     Outcome Measure Help from another person eating meals?: None Help from another person taking care of personal grooming?: None Help from another person toileting, which includes using toliet, bedpan, or urinal?: A Little Help from another person bathing (including washing, rinsing,  drying)?: A Little Help from another person to put on and taking off regular upper body clothing?: None Help from another person to put on and taking off regular lower body clothing?: A Little 6 Click Score: 21   End of Session    Activity Tolerance: Patient tolerated treatment well Patient left: in chair;with call bell/phone within reach;with chair alarm set  OT Visit Diagnosis: Other abnormalities of gait and mobility (R26.89)                Time: 9449-6759 OT Time Calculation (min): 10 min Charges:  OT General Charges $OT Visit: 1 Visit OT Evaluation $OT Eval Low Complexity: 1 Low  Jeni Salles, MPH, MS, OTR/L ascom [phone] 01/10/19, 12:14 PM

## 2019-01-10 NOTE — Progress Notes (Signed)
  Subjective:  Patient reports pain as mild.    Objective:   VITALS:   Vitals:   01/09/19 1158 01/09/19 1613 01/09/19 2312 01/10/19 0746  BP: 126/67 137/81 (!) 131/45 (!) 118/53  Pulse: (!) 57 68 78 84  Resp:   18 17  Temp: 97.6 F (36.4 C) 98 F (36.7 C) 98.9 F (37.2 C) 98.3 F (36.8 C)  TempSrc: Oral Oral Oral Oral  SpO2: 99% 95% 98% 96%  Weight:      Height:        PHYSICAL EXAM:  Neurologically intact ABD soft Neurovascular intact Sensation intact distally Intact pulses distally Dorsiflexion/Plantar flexion intact Incision: dressing C/D/I No cellulitis present Compartment soft  LABS  Results for orders placed or performed during the hospital encounter of 01/08/19 (from the past 24 hour(s))  CBC     Status: Abnormal   Collection Time: 01/10/19  5:00 AM  Result Value Ref Range   WBC 8.0 4.0 - 10.5 K/uL   RBC 3.34 (L) 3.87 - 5.11 MIL/uL   Hemoglobin 10.3 (L) 12.0 - 15.0 g/dL   HCT 32.2 (L) 36.0 - 46.0 %   MCV 96.4 80.0 - 100.0 fL   MCH 30.8 26.0 - 34.0 pg   MCHC 32.0 30.0 - 36.0 g/dL   RDW 13.3 11.5 - 15.5 %   Platelets 209 150 - 400 K/uL   nRBC 0.0 0.0 - 0.2 %    Dg Pelvis Portable  Result Date: 01/08/2019 CLINICAL DATA:  Total left hip arthroplasty. EXAM: PORTABLE PELVIS 1-2 VIEWS COMPARISON:  None. 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 No complicating features. Remote total right hip arthroplasty. IMPRESSION: Well seated components of a total left hip arthroplasty without complicating features. Electronically Signed   By: Marijo Sanes M.D.   On: 01/08/2019 10:45   Dg Hip Operative Unilat W Or W/o Pelvis Left  Result Date: 01/08/2019 CLINICAL DATA:  Total left hip replacement EXAM: OPERATIVE left HIP (WITH PELVIS IF PERFORMED) 2 VIEWS fluoroscopic time 19 seconds TECHNIQUE: Fluoroscopic spot image(s) were submitted for interpretation post-operatively. COMPARISON:  None. FINDINGS: Two fluoroscopic images demonstrate total left  hip replacement without malalignment. IMPRESSION: Left hip replacement without malalignment. Electronically Signed   By: Abelardo Diesel M.D.   On: 01/08/2019 09:43    Assessment/Plan: 2 Days Post-Op   Active Problems:   Osteoarthritis of right hip   Advance diet Up with therapy  Discharge to outpatient PT Follow up in 2 weeks in office.  Please call to confirm [place] , PA-C 01/10/2019, 8:50 AM

## 2019-01-10 NOTE — Progress Notes (Signed)
Patient will be discharged from unit today to home. Home Health to follow at home. DME present in room for patient to take home. Dressing to left hip intact with no noted drainage.   Patient offers no c/o discomfort at this time. All personal belongings with patient.  All discharge instructions reviewed with patient. Emphasis on follow up appts and pt to pick up prescriptions at [facility] in [place].  No distress noted.

## 2019-01-10 NOTE — Progress Notes (Signed)
Physical Therapy Treatment Patient Details Name: Kathleen Tran MRN: [id number] DOB: Aug 04, 1940 Today's Date: 01/10/2019    History of Present Illness Pt is a 78 y.o. 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  PMH includes DM type 2, h/o R partial hip arthroplasty, htn, PVD.    PT Comments    Pt appearing drowsy beginning/end of session but alert with activity.  Pt requesting to toilet (BSC brought next to bed d/t NT reporting pt's knees buckling multiple times this morning walking to/from bathroom--no fall).  Pt able to transfer to [facility] with CGA and walker use but on way back to bed pt's B knees buckling 2x's requiring min assist of therapist to maintain upright (pt also using UE support through walker to maintain upright).  Extra assist obtained for ambulation d/t knee buckling concerns (2nd CGA and close chair follow) but no knee buckling noted with ambulation (although limited distance ambulating to 60 feet d/t fatigue).  CM notified of need for youth sized walker (d/t regular height walker delivered to pt's room).  OT consult ordered to address ADL's and for safe discharge.  Nurse notified of pt's knee buckling and reports of drowsiness; also plan for afternoon PT session d/t above concerns.  Will continue to focus on strengthening and progressive ambulation per pt tolerance.    Follow Up Recommendations  Follow surgeon's recommendation for DC plan and follow-up therapies;Supervision/Assistance - 24 hour     Equipment Recommendations  Rolling walker with 5" wheels;3in1 (PT)(youth sized)    Recommendations for Other Services OT consult     Precautions / Restrictions Precautions Precautions: Anterior Hip;Fall Precaution Booklet Issued: Yes (comment) Restrictions Weight Bearing Restrictions: Yes LLE Weight Bearing: Weight bearing as tolerated    Mobility  Bed Mobility Overal bed mobility: Needs Assistance Bed Mobility: Supine to Sit     Supine to sit: Min assist     General bed  mobility comments: bed flat; assist for trunk  Transfers Overall transfer level: Needs assistance Equipment used: Rolling walker (2 wheeled)(youth sized) Transfers: Sit to/from [employer] Transfers Sit to Stand: Min guard Stand pivot transfers: Min guard;Min assist       General transfer comment: fairly strong stand from bed x2 trials and from [facility] x1 trial; CGA stand step turn bed to recliner with walker and initially CGA recliner to bed with walker but pt's knees buckled 2x's once getting closer to bed requiring min assist to maintain upright  Ambulation/Gait Ambulation/Gait assistance: Min guard;+2 safety/equipment(2nd CGA for safety and 3rd assist for chair follow for safety) Gait Distance (Feet): 60 Feet Assistive device: Rolling walker (2 wheeled)(youth sized) Gait Pattern/deviations: Step-to pattern Gait velocity: decreased   General Gait Details: initial vc's to increase UE support through RW to offweight L LE when advancing R LE and staying closer to walker; decreased B LE step length; antalgic; limited distance d/t fatigue; no knee buckling noted   Stairs             Wheelchair Mobility    Modified Rankin (Stroke Patients Only)       Balance Overall balance assessment: Needs assistance Sitting-balance support: No upper extremity supported;Feet supported Sitting balance-Leahy Scale: Good Sitting balance - Comments: steady sitting reaching within BOS   Standing balance support: Single extremity supported Standing balance-Leahy Scale: Good Standing balance comment: steady standing performing hygiene post toileting on BSC with single UE support  Cognition Arousal/Alertness: (Drowsy beginning/end of session but alert with activities) Behavior During Therapy: WFL for tasks assessed/performed Overall Cognitive Status: Within Functional Limits for tasks assessed                                         Exercises      General Comments  Pt agreeable to PT session.      Pertinent Vitals/Pain Pain Score: 0-No pain Pain Location: L hip Pain Intervention(s): Limited activity within patient's tolerance;Monitored during session;Premedicated before session;Repositioned  Vitals stable and WFL end of treatment session (BP 130/57; HR 80 bpm; and O2 sats 97% on room air).    Home Living                      Prior Function            PT Goals (current goals can now be found in the care plan section) Acute Rehab PT Goals Patient Stated Goal: to go home and have less L hip pain PT Goal Formulation: With patient Time For Goal Achievement: 01/22/19 Potential to Achieve Goals: Good Progress towards PT goals: Progressing toward goals    Frequency    BID      PT Plan Current plan remains appropriate    Co-evaluation              AM-PAC PT "6 Clicks" Mobility   Outcome Measure  Help needed turning from your back to your side while in a flat bed without using bedrails?: A Little Help needed moving from lying on your back to sitting on the side of a flat bed without using bedrails?: A Little Help needed moving to and from a bed to a chair (including a wheelchair)?: A Little Help needed standing up from a chair using your arms (e.g., wheelchair or bedside chair)?: A Little Help needed to walk in hospital room?: A Little Help needed climbing 3-5 steps with a railing? : A Little 6 Click Score: 18    End of Session Equipment Utilized During Treatment: Gait belt Activity Tolerance: Patient limited by fatigue Patient left: in chair;with call bell/phone within reach;with chair alarm set;with SCD's reapplied(B heels floating via pillow) Nurse Communication: Mobility status;Precautions;Weight bearing status;Other (comment)(pt's knee buckling and drowsiness) PT Visit Diagnosis: Other abnormalities of gait and mobility (R26.89);Muscle weakness (generalized) (M62.81);Difficulty  in walking, not elsewhere classified (R26.2);Pain Pain - Right/Left: Left Pain - part of body: Hip     Time: 4132-4401 PT Time Calculation (min) (ACUTE ONLY): 55 min  Charges:  $Gait Training: 8-22 mins $Therapeutic Activity: 38-52 mins                    Khalon Cansler, PT 01/10/19, 11:45 AM [phone]

## 2019-01-20 ENCOUNTER — Other Ambulatory Visit: Payer: Self-pay | Admitting: Physician Assistant

## 2019-01-20 ENCOUNTER — Other Ambulatory Visit: Payer: Self-pay

## 2019-01-20 ENCOUNTER — Ambulatory Visit
Admission: RE | Admit: 2019-01-20 | Discharge: 2019-01-20 | Disposition: A | Discharge status: Home / Self Care | Payer: Medicare Other | Source: Ambulatory Visit | Attending: Physician Assistant | Admitting: Physician Assistant

## 2019-01-20 DIAGNOSIS — R2242 Localized swelling, mass and lump, left lower limb: Secondary | ICD-10-CM | POA: Diagnosis present

## 2019-01-27 ENCOUNTER — Other Ambulatory Visit: Payer: Self-pay | Admitting: Physician Assistant

## 2019-01-27 DIAGNOSIS — E119 Type 2 diabetes mellitus without complications: Secondary | ICD-10-CM

## 2019-01-28 ENCOUNTER — Other Ambulatory Visit: Payer: Self-pay | Admitting: Physician Assistant

## 2019-01-28 DIAGNOSIS — M1990 Unspecified osteoarthritis, unspecified site: Secondary | ICD-10-CM

## 2019-02-06 ENCOUNTER — Encounter: Payer: Self-pay | Admitting: *Deleted

## 2019-02-07 ENCOUNTER — Other Ambulatory Visit: Payer: Self-pay

## 2019-02-07 ENCOUNTER — Other Ambulatory Visit
Admission: RE | Admit: 2019-02-07 | Discharge: 2019-02-07 | Disposition: A | Discharge status: Home / Self Care | Payer: Medicare Other | Source: Ambulatory Visit | Attending: Ophthalmology | Admitting: Ophthalmology

## 2019-02-07 DIAGNOSIS — Z01812 Encounter for preprocedural laboratory examination: Secondary | ICD-10-CM | POA: Insufficient documentation

## 2019-02-07 DIAGNOSIS — H2511 Age-related nuclear cataract, right eye: Secondary | ICD-10-CM | POA: Insufficient documentation

## 2019-02-07 DIAGNOSIS — Z20828 Contact with and (suspected) exposure to other viral communicable diseases: Secondary | ICD-10-CM | POA: Diagnosis not present

## 2019-02-08 LAB — SARS CORONAVIRUS 2 (TAT 6-24 HRS): SARS Coronavirus 2: NEGATIVE

## 2019-02-11 ENCOUNTER — Ambulatory Visit
Admission: RE | Admit: 2019-02-11 | Discharge: 2019-02-11 | Disposition: A | Discharge status: Home / Self Care | Payer: Medicare Other | Attending: Ophthalmology | Admitting: Ophthalmology

## 2019-02-11 ENCOUNTER — Ambulatory Visit: Payer: Medicare Other | Admitting: Anesthesiology

## 2019-02-11 ENCOUNTER — Other Ambulatory Visit: Payer: Self-pay

## 2019-02-11 ENCOUNTER — Encounter
Admission: RE | Disposition: A | Discharge status: Home / Self Care | Payer: Self-pay | Source: Home / Self Care | Attending: Ophthalmology

## 2019-02-11 DIAGNOSIS — H2511 Age-related nuclear cataract, right eye: Secondary | ICD-10-CM | POA: Diagnosis not present

## 2019-02-11 DIAGNOSIS — Z79899 Other long term (current) drug therapy: Secondary | ICD-10-CM | POA: Insufficient documentation

## 2019-02-11 DIAGNOSIS — E7800 Pure hypercholesterolemia, unspecified: Secondary | ICD-10-CM | POA: Insufficient documentation

## 2019-02-11 DIAGNOSIS — E1136 Type 2 diabetes mellitus with diabetic cataract: Secondary | ICD-10-CM | POA: Diagnosis not present

## 2019-02-11 DIAGNOSIS — Z7982 Long term (current) use of aspirin: Secondary | ICD-10-CM | POA: Diagnosis not present

## 2019-02-11 DIAGNOSIS — Z96642 Presence of left artificial hip joint: Secondary | ICD-10-CM | POA: Insufficient documentation

## 2019-02-11 DIAGNOSIS — E1151 Type 2 diabetes mellitus with diabetic peripheral angiopathy without gangrene: Secondary | ICD-10-CM | POA: Insufficient documentation

## 2019-02-11 DIAGNOSIS — Z793 Long term (current) use of hormonal contraceptives: Secondary | ICD-10-CM | POA: Insufficient documentation

## 2019-02-11 DIAGNOSIS — I10 Essential (primary) hypertension: Secondary | ICD-10-CM | POA: Insufficient documentation

## 2019-02-11 HISTORY — PX: CATARACT EXTRACTION W/PHACO: SHX586

## 2019-02-11 HISTORY — DX: Personal history of other diseases of the musculoskeletal system and connective tissue: Z87.39

## 2019-02-11 HISTORY — DX: Inflammatory liver disease, unspecified: K75.9

## 2019-02-11 HISTORY — DX: Personal history of other diseases of the digestive system: Z87.19

## 2019-02-11 LAB — GLUCOSE, CAPILLARY
Glucose-Capillary: 127 mg/dL — ABNORMAL HIGH (ref 70–99)
Glucose-Capillary: 120 mg/dL — ABNORMAL HIGH (ref 70–99)

## 2019-02-11 SURGERY — PHACOEMULSIFICATION, CATARACT, WITH IOL INSERTION
Anesthesia: Monitor Anesthesia Care | Site: Eye | Laterality: Right

## 2019-02-11 MED ORDER — MIDAZOLAM HCL 2 MG/2ML IJ SOLN
INTRAMUSCULAR | Status: AC
  Filled 2019-02-11: qty 2

## 2019-02-11 MED ORDER — EPINEPHRINE PF 1 MG/ML IJ SOLN
INTRAOCULAR | Status: DC | PRN
  Administered 2019-02-11: 09:00:00 1 mL via OPHTHALMIC

## 2019-02-11 MED ORDER — NEOMYCIN-POLYMYXIN-DEXAMETH 3.5-10000-0.1 OP OINT
TOPICAL_OINTMENT | OPHTHALMIC | Status: AC
  Filled 2019-02-11: qty 3.5

## 2019-02-11 MED ORDER — TETRACAINE HCL 0.5 % OP SOLN
1.0000 [drp] | Freq: Once | OPHTHALMIC | Status: AC
  Administered 2019-02-11 (×2): 1 [drp] via OPHTHALMIC

## 2019-02-11 MED ORDER — ARMC OPHTHALMIC DILATING DROPS
OPHTHALMIC | Status: AC
  Administered 2019-02-11: 1 via OPHTHALMIC
  Filled 2019-02-11: qty 0.5

## 2019-02-11 MED ORDER — TRYPAN BLUE 0.06 % OP SOLN
OPHTHALMIC | Status: AC
  Filled 2019-02-11: qty 0.5

## 2019-02-11 MED ORDER — ONDANSETRON HCL 4 MG/2ML IJ SOLN: 4.0000 mg | Freq: Once | INTRAMUSCULAR | Status: DC | PRN

## 2019-02-11 MED ORDER — FENTANYL CITRATE (PF) 100 MCG/2ML IJ SOLN: 25.0000 ug | INTRAMUSCULAR | Status: DC | PRN

## 2019-02-11 MED ORDER — MIDAZOLAM HCL 2 MG/2ML IJ SOLN
INTRAMUSCULAR | Status: DC | PRN
  Administered 2019-02-11 (×2): 1 mg via INTRAVENOUS

## 2019-02-11 MED ORDER — NA CHONDROIT SULF-NA HYALURON 40-30 MG/ML IO SOLN
INTRAOCULAR | Status: AC
  Filled 2019-02-11: qty 0.5

## 2019-02-11 MED ORDER — ARMC OPHTHALMIC DILATING DROPS
1.0000 "application " | OPHTHALMIC | Status: AC
  Administered 2019-02-11 (×3): 1 via OPHTHALMIC

## 2019-02-11 MED ORDER — NA HYALUR & NA CHOND-NA HYALUR 0.55-0.5 ML IO KIT
PACK | INTRAOCULAR | Status: AC
  Filled 2019-02-11: qty 1.05

## 2019-02-11 MED ORDER — NEOMYCIN-POLYMYXIN-DEXAMETH 0.1 % OP OINT
TOPICAL_OINTMENT | OPHTHALMIC | Status: DC | PRN
  Administered 2019-02-11: 1 via OPHTHALMIC

## 2019-02-11 MED ORDER — MOXIFLOXACIN HCL 0.5 % OP SOLN: 1.0000 [drp] | Freq: Once | OPHTHALMIC | Status: DC

## 2019-02-11 MED ORDER — NA HYALUR & NA CHOND-NA HYALUR 0.4-0.35 ML IO KIT
PACK | INTRAOCULAR | Status: DC | PRN
  Administered 2019-02-11: 1 mL via INTRAOCULAR

## 2019-02-11 MED ORDER — POVIDONE-IODINE 5 % OP SOLN
OPHTHALMIC | Status: DC | PRN
  Administered 2019-02-11: 1 via OPHTHALMIC

## 2019-02-11 MED ORDER — LIDOCAINE HCL (PF) 4 % IJ SOLN
INTRAMUSCULAR | Status: AC
  Filled 2019-02-11: qty 5

## 2019-02-11 MED ORDER — ATROPINE SULFATE 1 % OP SOLN
OPHTHALMIC | Status: AC
  Filled 2019-02-11: qty 2

## 2019-02-11 MED ORDER — MOXIFLOXACIN HCL 0.5 % OP SOLN
1.0000 [drp] | OPHTHALMIC | Status: AC | PRN
  Administered 2019-02-11 (×3): 1 [drp] via OPHTHALMIC

## 2019-02-11 MED ORDER — TETRACAINE HCL 0.5 % OP SOLN
OPHTHALMIC | Status: AC
  Administered 2019-02-11: 07:00:00 1 [drp] via OPHTHALMIC
  Filled 2019-02-11: qty 4

## 2019-02-11 MED ORDER — EPINEPHRINE PF 1 MG/ML IJ SOLN
INTRAMUSCULAR | Status: AC
  Filled 2019-02-11: qty 1

## 2019-02-11 MED ORDER — MOXIFLOXACIN HCL 0.5 % OP SOLN
OPHTHALMIC | Status: AC
  Administered 2019-02-11: 08:00:00 1 [drp] via OPHTHALMIC
  Filled 2019-02-11: qty 3

## 2019-02-11 MED ORDER — SODIUM CHLORIDE 0.9 % IV SOLN
INTRAVENOUS | Status: DC
  Administered 2019-02-11: 07:00:00 via INTRAVENOUS

## 2019-02-11 MED ORDER — POVIDONE-IODINE 5 % OP SOLN
OPHTHALMIC | Status: AC
  Filled 2019-02-11: qty 30

## 2019-02-11 MED ORDER — LIDOCAINE HCL (PF) 4 % IJ SOLN
INTRAOCULAR | Status: DC | PRN
  Administered 2019-02-11: 2.25 mL via OPHTHALMIC

## 2019-02-11 MED ORDER — CARBACHOL 0.01 % IO SOLN
INTRAOCULAR | Status: DC | PRN
  Administered 2019-02-11: .5 mL via INTRAOCULAR

## 2019-02-11 SURGICAL SUPPLY — 18 items
GLOVE BIO SURGEON STRL SZ8 (GLOVE) ×3 IMPLANT
GLOVE BIOGEL M 6.5 STRL (GLOVE) ×3 IMPLANT
GLOVE SURG LX 7.5 STRW (GLOVE) ×2
GLOVE SURG LX STRL 7.5 STRW (GLOVE) ×1 IMPLANT
GOWN STRL REUS W/ TWL LRG LVL3 (GOWN DISPOSABLE) ×2 IMPLANT
GOWN STRL REUS W/TWL LRG LVL3 (GOWN DISPOSABLE) ×4
LABEL CATARACT MEDS ST (LABEL) ×3 IMPLANT
LENS IOL TECNIS ITEC 22.5 (Intraocular Lens) ×3 IMPLANT
NDL HPO THNWL 1X22GA REG BVL (NEEDLE) ×1 IMPLANT
NEEDLE SAFETY 22GX1 (NEEDLE) ×2
PACK CATARACT (MISCELLANEOUS) ×3 IMPLANT
PACK CATARACT BRASINGTON LX (MISCELLANEOUS) ×3 IMPLANT
PACK EYE AFTER SURG (MISCELLANEOUS) ×3 IMPLANT
SOL BSS BAG (MISCELLANEOUS) ×3
SOLUTION BSS BAG (MISCELLANEOUS) ×1 IMPLANT
SYR 5ML LL (SYRINGE) ×3 IMPLANT
WATER STERILE IRR 250ML POUR (IV SOLUTION) ×3 IMPLANT
WIPE NON LINTING 3.25X3.25 (MISCELLANEOUS) ×3 IMPLANT

## 2019-02-11 NOTE — Discharge Instructions (Signed)

## 2019-02-11 NOTE — Transfer of Care (Signed)
Immediate Anesthesia Transfer of Care Note  Patient: Kathleen Tran  Procedure(s) Performed: CATARACT EXTRACTION PHACO AND INTRAOCULAR LENS PLACEMENT (IOC) RIGHT, DIABETIC (Right Eye)  Patient Location: Short Stay  Anesthesia Type:MAC  Level of Consciousness: awake, alert  and oriented  Airway & Oxygen Therapy: Patient Spontanous Breathing  Post-op Assessment: Report given to RN and Post -op Vital signs reviewed and stable  Post vital signs: Reviewed  Last Vitals:  Vitals Value Taken Time  BP 152/90 02/11/19 0905  Temp 36.8 C 02/11/19 0905  Pulse 72 02/11/19 0905  Resp 18 02/11/19 0905  SpO2 99 % 02/11/19 0905    Last Pain:  Vitals:   02/11/19 0652  TempSrc: Tympanic  PainSc: 0-No pain         Complications: No apparent anesthesia complications

## 2019-02-11 NOTE — Anesthesia Postprocedure Evaluation (Signed)
Anesthesia Post Note  Patient: Dentist  Procedure(s) Performed: CATARACT EXTRACTION PHACO AND INTRAOCULAR LENS PLACEMENT (IOC) RIGHT, DIABETIC (Right Eye)  Patient location during evaluation: Short Stay Anesthesia Type: MAC Level of consciousness: awake and alert Pain management: pain level controlled Vital Signs Assessment: post-procedure vital signs reviewed and stable Respiratory status: spontaneous breathing, nonlabored ventilation, respiratory function stable and patient connected to nasal cannula oxygen Cardiovascular status: stable and blood pressure returned to baseline Postop Assessment: no apparent nausea or vomiting Anesthetic complications: no     Last Vitals:  Vitals:   02/11/19 0652 02/11/19 0905  BP: (!) 158/64 (!) 152/90  Pulse: 69 72  Resp: 14 18  Temp: 37.2 C 36.8 C  SpO2: 100% 99%    Last Pain:  Vitals:   02/11/19 0652  TempSrc: Tympanic  PainSc: 0-No pain                 Rolla Plate P

## 2019-02-11 NOTE — Op Note (Signed)
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 Item Serial No. Manufacturer Lot No. LRB No. Used Action  LENS IOL DIOP 22.5 - [id number] Intraocular Lens LENS IOL DIOP 22.5 [id number] AMO  Right 1 Implanted       ULTRASOUND TIME: 12 %  of 0 minutes 51 seconds, CDE 8.2  SURGEON:  Wyonia Hough, MD   ANESTHESIA:  Topical with tetracaine drops and 2% Xylocaine jelly, augmented with 1% preservative-free intracameral lidocaine.    COMPLICATIONS:  None.   DESCRIPTION OF PROCEDURE:  The patient was identified in the holding room and transported to the operating room and placed in the supine position under the operating microscope. Theright eye was identified as the operative eye and it was prepped and draped in the usual sterile ophthalmic fashion.   A 1 millimeter clear-corneal paracentesis was made at the 12:00 position.  0.5 ml of preservative-free 1% lidocaine was injected into the anterior chamber. The anterior chamber was filled with Viscoat viscoelastic.  A 2.4 millimeter keratome was used to make a near-clear corneal incision at the 9:00 position. A curvilinear capsulorrhexis was made with a cystotome and capsulorrhexis forceps.  Balanced salt solution was used to hydrodissect and hydrodelineate the nucleus.   Phacoemulsification was then used in stop and chop fashion to remove the lens nucleus and epinucleus.  The remaining cortex was then removed using the irrigation and aspiration handpiece. Provisc was then placed into the capsular bag to distend it for lens placement.  A lens was then injected into the capsular bag.  The remaining viscoelastic was aspirated.  Wounds were hydrated  with balanced salt solution.  The anterior chamber was inflated to a physiologic pressure with balanced salt solution. Vigamox 0.2 ml of a 1mg  per ml solution was injected into the anterior chamber for a dose of 0.2 mg of intracameral antibiotic at the completion of the case. Miostat was placed into the anterior chamber to constrict the pupil.  No wound leaks were noted.  Topical Vigamox drops and Maxitrol ointment were applied to the eye.  The patient was taken to the recovery room in stable condition without complications of anesthesia or surgery.  Infantof Villagomez 02/11/2019, 9:04 AM

## 2019-02-11 NOTE — Anesthesia Preprocedure Evaluation (Signed)
Anesthesia Evaluation  Patient identified by MRN, date of birth, ID band Patient awake    Reviewed: Allergy & Precautions, H&P , NPO status , Patient's Chart, lab work & pertinent test results, reviewed documented beta blocker date and time   Airway Mallampati: II  TM Distance: >3 FB Neck ROM: full    Dental no notable dental hx. (+) Teeth Intact   Pulmonary shortness of breath, Patient did not abstain from smoking.,    Pulmonary exam normal breath sounds clear to auscultation       Cardiovascular Exercise Tolerance: Poor hypertension, On Medications + Peripheral Vascular Disease   Rhythm:regular Rate:Normal     Neuro/Psych negative neurological ROS  negative psych ROS   GI/Hepatic hiatal hernia, GERD  ,(+) Hepatitis -  Endo/Other  negative endocrine ROSdiabetes  Renal/GU      Musculoskeletal   Abdominal   Peds  Hematology negative hematology ROS (+)   Anesthesia Other Findings   Reproductive/Obstetrics negative OB ROS                             Anesthesia Physical Anesthesia Plan  ASA: III  Anesthesia Plan: MAC   Post-op Pain Management:    Induction:   PONV Risk Score and Plan: 2  Airway Management Planned:   Additional Equipment:   Intra-op Plan:   Post-operative Plan:   Informed Consent: I have reviewed the patients History and Physical, chart, labs and discussed the procedure including the risks, benefits and alternatives for the proposed anesthesia with the patient or authorized representative who has indicated his/her understanding and acceptance.       Plan Discussed with: CRNA  Anesthesia Plan Comments:         Anesthesia Quick Evaluation

## 2019-02-11 NOTE — Anesthesia Post-op Follow-up Note (Signed)
Anesthesia QCDR form completed.        

## 2019-02-11 NOTE — H&P (Signed)
.   The History and Physical notes are on paper, have been signed, and are to be scanned. The patient remains stable and unchanged from the H&P.   Previous H&P reviewed, patient examined, and there are no changes.  The patient has a visually significant cataract interfering with his or her vision.  I attest that the following are true and accurate to the best of my knowledge: 1. The patient's impairment of visual function is believed not to be correctable with a tolerable change in glasses or contact lenses. 2. Cataract (in the operative eye) is believed to be significantly contributing to the patient's visual impairment. 3. The patient desires surgical correction; the risks, benefits, and alternatives have been explained, and questions have been answered to the patients satisfaction.  A reasonable expectation exists that cataract surgery will significantly improve both the visual and functional status of the patient.  Kathleen Tran 02/11/2019 8:34 AM

## 2019-03-26 ENCOUNTER — Other Ambulatory Visit: Payer: Self-pay | Admitting: Physician Assistant

## 2019-03-26 DIAGNOSIS — E119 Type 2 diabetes mellitus without complications: Secondary | ICD-10-CM

## 2019-05-25 ENCOUNTER — Other Ambulatory Visit: Payer: Self-pay | Admitting: Physician Assistant

## 2019-05-25 DIAGNOSIS — E7800 Pure hypercholesterolemia, unspecified: Secondary | ICD-10-CM

## 2019-05-25 DIAGNOSIS — I10 Essential (primary) hypertension: Secondary | ICD-10-CM

## 2019-06-11 ENCOUNTER — Telehealth: Payer: Self-pay

## 2019-06-11 DIAGNOSIS — E119 Type 2 diabetes mellitus without complications: Secondary | ICD-10-CM

## 2019-06-11 MED ORDER — ONETOUCH VERIO IQ SYSTEM W/DEVICE KIT: PACK | 0 refills | Status: DC

## 2019-06-11 NOTE — Telephone Encounter (Signed)
Copied from [place] [phone]. Topic: General - Other >> Jun 11, 2019  9:50 AM Sheran Luz wrote: Patient is requesting a new blood glucose meter.

## 2019-07-14 ENCOUNTER — Telehealth: Payer: Self-pay | Admitting: Physician Assistant

## 2019-07-14 MED ORDER — ONETOUCH ULTRASOFT LANCETS MISC: 12 refills | Status: DC

## 2019-07-14 NOTE — Telephone Encounter (Signed)
Refilled lancets

## 2019-07-14 NOTE — Telephone Encounter (Signed)
Copied from [place] [phone]. Topic: Quick Communication - Rx Refill/Question >> Jul 14, 2019 12:18 PM Rainey Pines A wrote: Medication: Lancets   Has the patient contacted their pharmacy? Yes (Agent: If no, request that the patient contact the pharmacy for the refill.) (Agent: If yes, when and what did the pharmacy advise?)Contact PCP  Preferred Pharmacy (with phone number or street name): [facility] [id number], [place]  Phone:  [phone] Fax:  [phone]     Agent: Please be advised that RX refills may take up to 3 business days. We ask that you follow-up with your pharmacy.

## 2019-07-14 NOTE — Telephone Encounter (Signed)
This request for Lancets must be reordered to refill. Routing back to [facility].

## 2019-07-14 NOTE — Addendum Note (Signed)
Addended by: Mar Daring on: 07/14/2019 02:32 PM   Modules accepted: Orders

## 2019-08-08 ENCOUNTER — Other Ambulatory Visit: Payer: Self-pay | Admitting: Physician Assistant

## 2019-08-08 DIAGNOSIS — E119 Type 2 diabetes mellitus without complications: Secondary | ICD-10-CM

## 2019-08-13 NOTE — Progress Notes (Signed)
Subjective:   Kathleen Tran is a 79 y.o. female who presents for Medicare Annual (Subsequent) preventive examination.    This visit is being conducted through telemedicine due to the COVID-19 pandemic. This patient has given me verbal consent via doximity to conduct this visit, patient states they are participating from their home address. Some vital signs may be absent or patient reported.    Patient identification: identified by name, DOB, and current address  Review of Systems:  N/A  Cardiac Risk Factors include: advanced age (>72mn, >>96women);diabetes mellitus;dyslipidemia;hypertension     Objective:     Vitals: There were no vitals taken for this visit.  There is no height or weight on file to calculate BMI. Unable to obtain vitals due to visit being conducted via telephonically.   Diabetes:  Is the patient diabetic?  Yes  If diabetic, was a CBG obtained today?  No  Did the patient bring in their glucometer from home?  No  How often do you monitor your CBG's? Once a day in the AM.   Financial Strains and Diabetes Management:  Are you having any financial strains with the device, your supplies or your medication? No .  Does the patient want to be seen by Chronic Care Management for management of their diabetes?  No  Would the patient like to be referred to a Nutritionist or for Diabetic Management?  No   Diabetic Exams:  Diabetic Eye Exam: Completed 07/18/18. Overdue for diabetic eye exam. Pt has been advised about the importance in completing this exam. Eye exam is scheduled is for 01/2020.  Diabetic Foot Exam: Completed 08/12/18. Pt has been advised about the importance in completing this exam. Note made to follow up on this at next in office apt.   Advanced Directives 08/14/2019 02/11/2019 02/11/2019 01/08/2019 01/01/2019 08/12/2018 09/26/2017  Does Patient Have a Medical Advance Directive? Yes Yes Yes No Yes Yes No  Type of AParamedicof  [place] will - Living will - [place] will [place] will -  Does patient want to make changes to medical advance directive? - - No - Patient declined - - - -  Copy of [place] Chart? No - copy requested - - - - No - copy requested -  Would patient like information on creating a medical advance directive? - - - No - Patient declined - - -    Tobacco Social History   Tobacco Use  Smoking Status Never Smoker  Smokeless Tobacco Never Used     Counseling given: Not Answered   Clinical Intake:  Pre-visit preparation completed: Yes  Pain : 0-10 Pain Score: 7  Pain Location: Toe (Comment which one)(5th toe) Pain Orientation: Left Pain Descriptors / Indicators: Aching Pain Onset: More than a month ago Pain Frequency: Intermittent     Nutritional Risks: Nausea/ vomitting/ diarrhea(Diarrhea occasionally diet.) Diabetes: Yes  How often do you need to have someone help you when you read instructions, pamphlets, or other written materials from your doctor or pharmacy?: 1 - Never  Interpreter Needed?: No  [facility]  Past Medical History:  Diagnosis Date  . Arthritis   . Diabetes mellitus without complication ([place]   . Dyspnea   . GERD (gastroesophageal reflux disease)   . Hepatitis    at age 79 . History of herniated intervertebral disc    lower back  . History of hiatal hernia   .  Hyperlipidemia   . Hypertension   . Tremors of nervous system    HEAD   Past Surgical History:  Procedure Laterality Date  . APPENDECTOMY    . CATARACT EXTRACTION W/PHACO Left 09/11/2018   Procedure: CATARACT EXTRACTION PHACO AND INTRAOCULAR LENS PLACEMENT (IOC) LEFT, DIABETIC;  Surgeon: Leandrew Koyanagi, MD;  Location: [facility] ORS;  Service: Ophthalmology;  Laterality: Left;  [place]  01:03 AP% 7.9 CDE 6.89 Fluid pack lot # [id number]  . CATARACT EXTRACTION W/PHACO Right 02/11/2019     Procedure: CATARACT EXTRACTION PHACO AND INTRAOCULAR LENS PLACEMENT (IOC) RIGHT, DIABETIC;  Surgeon: Leandrew Koyanagi, MD;  Location: [facility] ORS;  Service: Ophthalmology;  Laterality: Right;  [place] 00:51 AP% 11.6 CDE 8.20 fluid pack lot # [id number]  . COLONOSCOPY WITH PROPOFOL N/A 09/26/2017   Procedure: COLONOSCOPY WITH PROPOFOL;  Surgeon: Virgel Manifold, MD;  Location: [facility] ENDOSCOPY;  Service: Endoscopy;  Laterality: N/A;  . CYST EXCISION     "base of spine"  . EYE SURGERY Left    cataract extraction  . JOINT REPLACEMENT Left    hip replacement  . PARTIAL HIP ARTHROPLASTY Right   . TONSILLECTOMY    . 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 No pertinent family history. Social History   Socioeconomic History  . Marital status: Married    Spouse name: Kathleen Tran ...son  . Number of children: 6  . Years of education: Not on file  . Highest education level: GED or equivalent  Occupational History  . Occupation: retired  Tobacco Use  . Smoking status: Never Smoker  . Smokeless tobacco: Never Used  Substance and Sexual Activity  . Alcohol use: No  . Drug use: No  . Sexual activity: Not on file  Other Topics Concern  . Not on file  Social History Narrative  . Not on file   Social Determinants of Health   Financial Resource Strain: Low Risk   . Difficulty of Paying Living Expenses: Not hard at all  Food Insecurity: No Food Insecurity  . Worried About Charity fundraiser in the Last Year: Never true  . Ran Out of Food in the Last Year: Never true  Transportation Needs: No Transportation Needs  . Lack of Transportation (Medical): No  . Lack of Transportation (Non-Medical): No  Physical Activity: Inactive  . Days of Exercise per Week: 0 days  . Minutes of Exercise per Session: 0 min  Stress: No Stress  Concern Present  . Feeling of Stress : Only a little  Social Connections: Somewhat Isolated  . Frequency of Communication with Friends and Family: Once a week  . Frequency of Social Gatherings with Friends and Family: Never  . Attends Religious Services: More than 4 times per year  . Active Member of Clubs or Organizations: No  . Attends Archivist Meetings: Never  . Marital Status: Married    Outpatient Encounter Medications as of 08/14/2019  Medication Sig  . acetaminophen (TYLENOL) 500 MG tablet Take 1,000 mg by mouth daily as needed for moderate pain or headache.  Marland Kitchen aspirin 81 MG chewable tablet Chew 1 tablet (81 mg total) by mouth 2 (two) times daily. (Patient taking differently: Chew 81 mg by mouth daily. (Not twice a day))  . Blood Glucose Monitoring Suppl (ONETOUCH VERIO IQ SYSTEM) w/Device KIT Check blood sugar a.c.h.s.  . Calcium Carb-Cholecalciferol (  CALCIUM 1000 + D PO) Take 3 tablets by mouth daily before lunch.  . gabapentin (NEURONTIN) 400 MG capsule Take 1 capsule (400 mg total) by mouth 3 (three) times daily.  Marland Kitchen glipiZIDE (GLUCOTROL) 5 MG tablet TAKE 1/2 TABLET BY MOUTH EVERY DAY BEFORE SUPPER  . Lancets (ONETOUCH ULTRASOFT) lancets Use as instructed  . losartan (COZAAR) 50 MG tablet TAKE 1 TABLET(50 MG) BY MOUTH EVERY EVENING  . metFORMIN (GLUCOPHAGE) 1000 MG tablet TAKE 1 TABLET(1000 MG) BY MOUTH TWICE DAILY WITH A MEAL  . Multiple Vitamins-Minerals (ADULT GUMMY PO) Take 2 tablets by mouth daily before lunch.  . Omega-3 Fatty Acids (FISH OIL PO) Take 2 capsules by mouth daily before lunch.   [name] VERIO test strip TEST BLOOD SUGAR ONCE DAILY  . pravastatin (PRAVACHOL) 80 MG tablet TAKE 1 TABLET(80 MG) BY MOUTH EVERY EVENING  . vitamin B-12 (CYANOCOBALAMIN) 1000 MCG tablet Take 1,000 mcg by mouth daily before lunch.   . docusate sodium (COLACE) 100 MG capsule Take 1 capsule (100 mg total) by mouth 2 (two) times daily. (Patient not taking: Reported on  02/05/2019)  . ondansetron (ZOFRAN) 4 MG tablet Take 1 tablet (4 mg total) by mouth every 6 (six) hours as needed for nausea. (Patient not taking: Reported on 02/11/2019)  . traMADol (ULTRAM) 50 MG tablet Take 1 tablet (50 mg total) by mouth every 6 (six) hours. (Patient not taking: Reported on 08/14/2019)   No facility-administered encounter medications on file as of 08/14/2019.    Activities of Daily Living In your present state of health, do you have any difficulty performing the following activities: 08/14/2019 02/11/2019  Hearing? N N  Vision? N N  Difficulty concentrating or making decisions? N N  Walking or climbing stairs? N N  Dressing or bathing? N N  Doing errands, shopping? N -  Preparing Food and eating ? N -  Using the Toilet? N -  In the past six months, have you accidently leaked urine? N -  Do you have problems with loss of bowel control? N -  Managing your Medications? N -  Managing your Finances? N -  Housekeeping or managing your Housekeeping? N -  Some recent data might be hidden    Patient Care Team: Rubye Beach as PCP - General (Physician Assistant) Lovell Sheehan, MD as Consulting Physician (Orthopedic Surgery) [facility], [facility])    Assessment:   This is a routine wellness examination for Khya.  Exercise Activities and Dietary recommendations Current Exercise Habits: The patient does not participate in regular exercise at present, Exercise limited by: None identified  Goals    . DIET - REDUCE SUGAR INTAKE     Recommend cutting out sweets in daily diet. Pt to avoid eating desserts to help aid in weight loss and help diabetes.   08/12/18: Continue watching sugar intake and try switching to sugar free yogurts to substitute for sweets.        Fall Risk: Fall Risk  08/14/2019 08/12/2018 07/13/2017 07/13/2016  Falls in the past year? 0 0 No No  Number falls in past yr: 0 - - -  Injury with Fall? 0 - - -    FALL RISK PREVENTION  PERTAINING TO THE HOME:  Any stairs in or around the home? No  If so, are there any without handrails? N/A  Home free of loose throw rugs in walkways, pet beds, electrical cords, etc? Yes  Adequate lighting in your home to reduce  risk of falls? Yes   ASSISTIVE DEVICES UTILIZED TO PREVENT FALLS:  Life alert? No  Use of a cane, walker or w/c? Yes  Grab bars in the bathroom? Yes  Shower chair or bench in shower? No  Elevated toilet seat or a handicapped toilet? No    TIMED UP AND GO:  Was the test performed? No .    Depression Screen PHQ 2/9 Scores 08/14/2019 08/12/2018 08/12/2018 07/13/2017  PHQ - 2 Score 0 0 0 0  PHQ- 9 Score - 1 - -     Cognitive Function: Declined today.        Immunization History  Administered Date(s) Administered  . Pneumococcal Conjugate-13 05/19/2013  . Pneumococcal Polysaccharide-23 01/05/2006, 05/03/2009  . Td 08/19/2009  . Tdap 08/19/2009  . Zoster 08/19/2009    Qualifies for Shingles Vaccine? Yes  Zostavax completed 08/19/09. Due for Shingrix. Pt has been advised to call insurance company to determine out of pocket expense. Advised may also receive vaccine at local pharmacy or Health Dept. Verbalized acceptance and understanding.  Tdap: Up to date  Flu Vaccine: Due for Flu vaccine. Does the patient want to receive this vaccine today?  No . Advised may receive this vaccine at local pharmacy or Health Dept. Aware to provide a copy of the vaccination record if obtained from local pharmacy or Health Dept. Verbalized acceptance and understanding.  Pneumococcal Vaccine: Completed series  Screening Tests Health Maintenance  Topic Date Due  . HEMOGLOBIN A1C  02/10/2019  . OPHTHALMOLOGY EXAM  07/19/2019  . FOOT EXAM  08/13/2019  . INFLUENZA VACCINE  10/01/2019 (Originally 02/01/2019)  . TETANUS/TDAP  08/20/2019  . COLONOSCOPY  09/27/2019  . DEXA SCAN  11/20/2023  . PNA vac Low Risk Adult  Completed    Cancer Screenings:  Colorectal  Screening: Completed 09/26/17. Repeat every 2 years.   Mammogram: No longer required.   Bone Density: Completed 11/20/18. Results reflect OSTEOPENIA. Repeat every 5 years.   Lung Cancer Screening: (Low Dose CT Chest recommended if Age 78-80 years, 30 pack-year currently smoking OR have quit w/in 15years.) does not qualify.   Additional Screening:  Vision Screening: Recommended annual ophthalmology exams for early detection of glaucoma and other disorders of the eye.  Dental Screening: Recommended annual dental exams for proper oral hygiene  Community Resource Referral:  CRR required this visit?  No       Plan:  I have personally reviewed and addressed the Medicare Annual Wellness questionnaire and have noted the following in the patient's chart:  A. Medical and social history B. Use of alcohol, tobacco or illicit drugs  C. Current medications and supplements D. Functional ability and status E.  Nutritional status F.  Physical activity G. Advance directives H. List of other physicians I.  Hospitalizations, surgeries, and ER visits in previous 12 months J.  [place] such as hearing and vision if needed, cognitive and depression L. Referrals and appointments   In addition, I have reviewed and discussed with patient certain preventive protocols, quality metrics, and best practice recommendations. A written personalized care plan for preventive services as well as general preventive health recommendations were provided to patient. Nurse Health Advisor  Signed,    [place], [place]  [id number] Nurse Health Advisor   Nurse Notes: Pt needs a diabetic foot exam and A1c check at next in office apt. Pt has an eye exam scheduled for 01/2020.

## 2019-08-14 ENCOUNTER — Other Ambulatory Visit: Payer: Self-pay

## 2019-08-14 ENCOUNTER — Ambulatory Visit (INDEPENDENT_AMBULATORY_CARE_PROVIDER_SITE_OTHER): Payer: Medicare Other

## 2019-08-14 DIAGNOSIS — Z0000 Encounter for general adult medical examination without abnormal findings: Secondary | ICD-10-CM | POA: Diagnosis not present

## 2019-08-14 NOTE — Patient Instructions (Signed)
Kathleen Tran , Thank you for taking time to come for your Medicare Wellness Visit. I appreciate your ongoing commitment to your health goals. Please review the following plan we discussed and let me know if I can assist you in the future.   Screening recommendations/referrals: Colonoscopy: Up to date, due 09/2019 Mammogram: No longer required.  Bone Density: Up to date, due 11/2023 Recommended yearly ophthalmology/optometry visit for glaucoma screening and checkup Recommended yearly dental visit for hygiene and checkup  Vaccinations: Influenza vaccine: Pt declines today.  Pneumococcal vaccine: Completed series Tdap vaccine: Up to date, due 08/2019 Shingles vaccine: Pt declines today.     Advanced directives: Currently on file  Conditions/risks identified: Recommend to continue to cut back on daily sugars to help with diabetes control and loose weight.   Next appointment: 08/27/18 @ 10:00 AM with [place] 65 Years and Older, Female Preventive care refers to lifestyle choices and visits with your health care provider that can promote health and wellness. What does preventive care include?  A yearly physical exam. This is also called an annual well check.  Dental exams once or twice a year.  Routine eye exams. Ask your health care provider how often you should have your eyes checked.  Personal lifestyle choices, including:  Daily care of your teeth and gums.  Regular physical activity.  Eating a healthy diet.  Avoiding tobacco and drug use.  Limiting alcohol use.  Practicing safe sex.  Taking low-dose aspirin every day.  Taking vitamin and mineral supplements as recommended by your health care provider. What happens during an annual well check? The services and screenings done by your health care provider during your annual well check will depend on your age, overall health, lifestyle risk factors, and family history of disease. Counseling    Your health care provider may ask you questions about your:  Alcohol use.  Tobacco use.  Drug use.  Emotional well-being.  Home and relationship well-being.  Sexual activity.  Eating habits.  History of falls.  Memory and ability to understand (cognition).  Work and work Statistician.  Reproductive health. Screening  You may have the following tests or measurements:  Height, weight, and BMI.  Blood pressure.  Lipid and cholesterol levels. These may be checked every 5 years, or more frequently if you are over 7 years old.  Skin check.  Lung cancer screening. You may have this screening every year starting at age 64 if you have a 30-pack-year history of smoking and currently smoke or have quit within the past 15 years.  Fecal occult blood test (FOBT) of the stool. You may have this test every year starting at age 77.  Flexible sigmoidoscopy or colonoscopy. You may have a sigmoidoscopy every 5 years or a colonoscopy every 10 years starting at age 22.  Hepatitis C blood test.  Hepatitis B blood test.  Sexually transmitted disease (STD) testing.  Diabetes screening. This is done by checking your blood sugar (glucose) after you have not eaten for a while (fasting). You may have this done every 1-3 years.  Bone density scan. This is done to screen for osteoporosis. You may have this done starting at age 10.  Mammogram. This may be done every 1-2 years. Talk to your health care provider about how often you should have regular mammograms. Talk with your health care provider about your test results, treatment options, and if necessary, the need for more tests. Vaccines  Your health care provider  may recommend certain vaccines, such as:  Influenza vaccine. This is recommended every year.  Tetanus, diphtheria, and acellular pertussis (Tdap, Td) vaccine. You may need a Td booster every 10 years.  Zoster vaccine. You may need this after age 72.  Pneumococcal 13-valent  conjugate (PCV13) vaccine. One dose is recommended after age 33.  Pneumococcal polysaccharide (PPSV23) vaccine. One dose is recommended after age 71. Talk to your health care provider about which screenings and vaccines you need and how often you need them. This information is not intended to replace advice given to you by your health care provider. Make sure you discuss any questions you have with your health care provider. Document Released: 07/16/2015 Document Revised: 03/08/2016 Document Reviewed: 04/20/2015 Elsevier Interactive Patient Education  2017 [place] Prevention in the Home Falls can cause injuries. They can happen to people of all ages. There are many things you can do to make your home safe and to help prevent falls. What can I do on the outside of my home?  Regularly fix the edges of walkways and driveways and fix any cracks.  Remove anything that might make you trip as you walk through a door, such as a raised step or threshold.  Trim any bushes or trees on the path to your home.  Use bright outdoor lighting.  Clear any walking paths of anything that might make someone trip, such as rocks or tools.  Regularly check to see if handrails are loose or broken. Make sure that both sides of any steps have handrails.  Any raised decks and porches should have guardrails on the edges.  Have any leaves, snow, or ice cleared regularly.  Use sand or salt on walking paths during winter.  Clean up any spills in your garage right away. This includes oil or grease spills. What can I do in the bathroom?  Use night lights.  Install grab bars by the toilet and in the tub and shower. Do not use towel bars as grab bars.  Use non-skid mats or decals in the tub or shower.  If you need to sit down in the shower, use a plastic, non-slip stool.  Keep the floor dry. Clean up any water that spills on the floor as soon as it happens.  Remove soap buildup in the tub or  shower regularly.  Attach bath mats securely with double-sided non-slip rug tape.  Do not have throw rugs and other things on the floor that can make you trip. What can I do in the bedroom?  Use night lights.  Make sure that you have a light by your bed that is easy to reach.  Do not use any sheets or blankets that are too big for your bed. They should not hang down onto the floor.  Have a firm chair that has side arms. You can use this for support while you get dressed.  Do not have throw rugs and other things on the floor that can make you trip. What can I do in the kitchen?  Clean up any spills right away.  Avoid walking on wet floors.  Keep items that you use a lot in easy-to-reach places.  If you need to reach something above you, use a strong step stool that has a grab bar.  Keep electrical cords out of the way.  Do not use floor polish or wax that makes floors slippery. If you must use wax, use non-skid floor wax.  Do not have throw rugs  and other things on the floor that can make you trip. What can I do with my stairs?  Do not leave any items on the stairs.  Make sure that there are handrails on both sides of the stairs and use them. Fix handrails that are broken or loose. Make sure that handrails are as long as the stairways.  Check any carpeting to make sure that it is firmly attached to the stairs. Fix any carpet that is loose or worn.  Avoid having throw rugs at the top or bottom of the stairs. If you do have throw rugs, attach them to the floor with carpet tape.  Make sure that you have a light switch at the top of the stairs and the bottom of the stairs. If you do not have them, ask someone to add them for you. What else can I do to help prevent falls?  Wear shoes that:  Do not have high heels.  Have rubber bottoms.  Are comfortable and fit you well.  Are closed at the toe. Do not wear sandals.  If you use a stepladder:  Make sure that it is fully  opened. Do not climb a closed stepladder.  Make sure that both sides of the stepladder are locked into place.  Ask someone to hold it for you, if possible.  Clearly mark and make sure that you can see:  Any grab bars or handrails.  First and last steps.  Where the edge of each step is.  Use tools that help you move around (mobility aids) if they are needed. These include:  Canes.  Walkers.  Scooters.  Crutches.  Turn on the lights when you go into a dark area. Replace any light bulbs as soon as they burn out.  Set up your furniture so you have a clear path. Avoid moving your furniture around.  If any of your floors are uneven, fix them.  If there are any pets around you, be aware of where they are.  Review your medicines with your doctor. Some medicines can make you feel dizzy. This can increase your chance of falling. Ask your doctor what other things that you can do to help prevent falls. This information is not intended to replace advice given to you by your health care provider. Make sure you discuss any questions you have with your health care provider. Document Released: 04/15/2009 Document Revised: 11/25/2015 Document Reviewed: 07/24/2014 Elsevier Interactive Patient Education  2017 [employer].

## 2019-08-18 ENCOUNTER — Encounter: Payer: Medicare Other | Admitting: Physician Assistant

## 2019-08-18 ENCOUNTER — Ambulatory Visit: Payer: Medicare Other

## 2019-08-22 ENCOUNTER — Other Ambulatory Visit: Payer: Self-pay | Admitting: Physician Assistant

## 2019-08-22 DIAGNOSIS — I10 Essential (primary) hypertension: Secondary | ICD-10-CM

## 2019-08-28 ENCOUNTER — Other Ambulatory Visit: Payer: Self-pay

## 2019-08-28 ENCOUNTER — Ambulatory Visit (INDEPENDENT_AMBULATORY_CARE_PROVIDER_SITE_OTHER): Payer: Medicare Other | Admitting: Physician Assistant

## 2019-08-28 ENCOUNTER — Encounter: Payer: Self-pay | Admitting: Physician Assistant

## 2019-08-28 VITALS — BP 130/74 | HR 57 | Temp 95.9°F | Wt 181.6 lb

## 2019-08-28 DIAGNOSIS — E1151 Type 2 diabetes mellitus with diabetic peripheral angiopathy without gangrene: Secondary | ICD-10-CM | POA: Diagnosis not present

## 2019-08-28 DIAGNOSIS — E1142 Type 2 diabetes mellitus with diabetic polyneuropathy: Secondary | ICD-10-CM | POA: Diagnosis not present

## 2019-08-28 DIAGNOSIS — I10 Essential (primary) hypertension: Secondary | ICD-10-CM

## 2019-08-28 DIAGNOSIS — E1159 Type 2 diabetes mellitus with other circulatory complications: Secondary | ICD-10-CM | POA: Diagnosis not present

## 2019-08-28 DIAGNOSIS — E1169 Type 2 diabetes mellitus with other specified complication: Secondary | ICD-10-CM | POA: Diagnosis not present

## 2019-08-28 DIAGNOSIS — B351 Tinea unguium: Secondary | ICD-10-CM

## 2019-08-28 DIAGNOSIS — Z1239 Encounter for other screening for malignant neoplasm of breast: Secondary | ICD-10-CM

## 2019-08-28 DIAGNOSIS — K635 Polyp of colon: Secondary | ICD-10-CM

## 2019-08-28 DIAGNOSIS — Z0000 Encounter for general adult medical examination without abnormal findings: Secondary | ICD-10-CM

## 2019-08-28 MED ORDER — DULOXETINE HCL 20 MG PO CPEP: 20.0000 mg | ORAL_CAPSULE | Freq: Every day | ORAL | 1 refills | Status: DC

## 2019-08-28 NOTE — Progress Notes (Signed)
Patient: Kathleen Tran, Female    DOB: 1940-12-28, 79 y.o.   MRN: [id number] Visit Date: 08/28/2019  Today's Provider: Mar Daring, PA-C   No chief complaint on file.  Subjective:     Complete Physical Kathleen Tran is a 79 y.o. female. She feels fairly well. She reports exercising none. She reports she is sleeping well.  Does have worsening numbness in the left foot and pain in the left little toe. Has been aggravated and worsening since her left hip surgery in July 2020.  -----------------------------------------------------------   Review of Systems  Constitutional: Negative.   HENT: Positive for nosebleeds and trouble swallowing. Negative for congestion, dental problem, drooling, ear discharge, ear pain, facial swelling, hearing loss, mouth sores, postnasal drip, rhinorrhea, sinus pressure, sinus pain, sneezing, sore throat, tinnitus and voice change.   Eyes: Positive for itching.  Respiratory: Negative.   Cardiovascular: Positive for chest pain. Negative for palpitations and leg swelling.  Gastrointestinal: Positive for abdominal pain. Negative for abdominal distention, anal bleeding, constipation, diarrhea, nausea, rectal pain and vomiting.  Endocrine: Negative.   Genitourinary: Negative.   Musculoskeletal: Positive for arthralgias, back pain and neck pain. Negative for gait problem, joint swelling, myalgias and neck stiffness.  Skin: Negative.   Allergic/Immunologic: Negative.   Neurological: Positive for numbness (in left foot; little toe hurts) and headaches (at night).  Hematological: Negative.   Psychiatric/Behavioral: Negative.     Social History   Socioeconomic History  . Marital status: Married    Spouse name: [name] ...son  . Number of children: 6  . Years of education: Not on file  . Highest education level: GED or equivalent  Occupational History  . Occupation: retired  Tobacco Use  . Smoking status: Never Smoker  . Smokeless tobacco:  Never Used  Substance and Sexual Activity  . Alcohol use: No  . Drug use: No  . Sexual activity: Not on file  Other Topics Concern  . Not on file  Social History Narrative  . Not on file   Social Determinants of Health   Financial Resource Strain: Low Risk   . Difficulty of Paying Living Expenses: Not hard at all  Food Insecurity: No Food Insecurity  . Worried About Charity fundraiser in the Last Year: Never true  . Ran Out of Food in the Last Year: Never true  Transportation Needs: No Transportation Needs  . Lack of Transportation (Medical): No  . Lack of Transportation (Non-Medical): No  Physical Activity: Inactive  . Days of Exercise per Week: 0 days  . Minutes of Exercise per Session: 0 min  Stress: No Stress Concern Present  . Feeling of Stress : Only a little  Social Connections: Somewhat Isolated  . Frequency of Communication with Friends and Family: Once a week  . Frequency of Social Gatherings with Friends and Family: Never  . Attends Religious Services: More than 4 times per year  . Active Member of Clubs or Organizations: No  . Attends Archivist Meetings: Never  . Marital Status: Married  Human resources officer Violence: Not At Risk  . Fear of Current or Ex-Partner: No  . Emotionally Abused: No  . Physically Abused: No  . Sexually Abused: No    Past Medical History:  Diagnosis Date  . Arthritis   . Diabetes mellitus without complication ([place])   . Dyspnea   . GERD (gastroesophageal reflux disease)   . Hepatitis    at age 38  . History of  herniated intervertebral disc    lower back  . History of hiatal hernia   . Hyperlipidemia   . Hypertension   . Tremors of nervous system    HEAD     Patient Active Problem List   Diagnosis Date Noted  . Osteoarthritis of right hip 01/08/2019  . Onychomycosis of multiple toenails with type 2 diabetes mellitus and peripheral angiopathy ([place]) 08/12/2018  . GERD (gastroesophageal reflux disease) 02/20/2018  .  Positive colorectal cancer screening using Cologuard test   . Benign neoplasm of cecum   . Benign neoplasm of ascending colon   . Benign neoplasm of descending colon   . External hemorrhoids   . Arthritis 04/12/2015  . Type 2 diabetes mellitus with vascular disease ([place]) 04/12/2015  . Osteoporosis 04/12/2015  . Hypercholesterolemia 04/12/2015  . Essential hypertension 04/12/2015    Past Surgical History:  Procedure Laterality Date  . APPENDECTOMY    . CATARACT EXTRACTION W/PHACO Left 09/11/2018   Procedure: CATARACT EXTRACTION PHACO AND INTRAOCULAR LENS PLACEMENT (IOC) LEFT, DIABETIC;  Surgeon: Leandrew Koyanagi, MD;  Location: [facility] OR[facility];  Service: Ophthalmology;  Laterality: Left;  [place]  01:03 AP% 7.9 CDE 6.89 Fluid pack lot # [id number]  . CATARACT EXTRACTION W/PHACO Right 02/11/2019   Procedure: CATARACT EXTRACTION PHACO AND INTRAOCULAR LENS PLACEMENT (IOC) RIGHT, DIABETIC;  Surgeon: Leandrew Koyanagi, MD;  Location: [facility];  Service: Ophthalmology;  Laterality: Right;  [place] 00:51 AP% 11.6 CDE 8.20 fluid pack lot # [id number]  . COLONOSCOPY WITH PROPOFOL N/A 09/26/2017   Procedure: COLONOSCOPY WITH PROPOFOL;  Surgeon: Virgel Manifold, MD;  Location: [facility];  Service: Endoscopy;  Laterality: N/A;  . CYST EXCISION     "base of spine"  . EYE SURGERY Left    cataract extraction  . JOINT REPLACEMENT Left    hip replacement  . PARTIAL HIP ARTHROPLASTY Right   . TONSILLECTOMY    . TOTAL HIP ARTHROPLASTY Left 01/08/2019   Procedure: TOTAL HIP ARTHROPLASTY ANTERIOR APPROACH;  Surgeon: Lovell Sheehan, MD;  Location: [facility] OR[facility];  Service: Orthopedics;  Laterality: Left;    Her family history is not on file.   Current Outpatient Medications:  .  acetaminophen (TYLENOL) 500 MG tablet, Take 1,000 mg by mouth daily as needed for moderate pain or headache., Disp: , Rfl:  .  aspirin 81 MG chewable tablet, Chew 1 tablet (81 mg total) by mouth 2 (two) times daily. (Patient taking  differently: Chew 81 mg by mouth daily. (Not twice a day)), Disp: 60 tablet, Rfl: 0 .  Blood Glucose Monitoring Suppl (ONETOUCH VERIO IQ SYSTEM) w/Device KIT, Check blood sugar a.c.h.s., Disp: 1 kit, Rfl: 0 .  Calcium Carb-Cholecalciferol (CALCIUM 1000 + D PO), Take 3 tablets by mouth daily before lunch., Disp: , Rfl:  .  docusate sodium (COLACE) 100 MG capsule, Take 1 capsule (100 mg total) by mouth 2 (two) times daily. (Patient not taking: Reported on 02/05/2019), Disp: 20 capsule, Rfl: 0 .  gabapentin (NEURONTIN) 400 MG capsule, Take 1 capsule (400 mg total) by mouth 3 (three) times daily., Disp: 90 capsule, Rfl: 5 .  glipiZIDE (GLUCOTROL) 5 MG tablet, TAKE 1/2 TABLET BY MOUTH EVERY DAY BEFORE SUPPER, Disp: 45 tablet, Rfl: 0 .  Lancets (ONETOUCH ULTRASOFT) lancets, Use as instructed, Disp: 100 each, Rfl: 12 .  losartan (COZAAR) 50 MG tablet, TAKE 1 TABLET(50 MG) BY MOUTH EVERY EVENING, Disp: 30 tablet, Rfl: 0 .  metFORMIN (GLUCOPHAGE) 1000 MG tablet, TAKE 1 TABLET(1000 MG) BY MOUTH TWICE  DAILY WITH A MEAL, Disp: 180 tablet, Rfl: 1 .  Multiple Vitamins-Minerals (ADULT GUMMY PO), Take 2 tablets by mouth daily before lunch., Disp: , Rfl:  .  Omega-3 Fatty Acids (FISH OIL PO), Take 2 capsules by mouth daily before lunch. , Disp: , Rfl:  .  ondansetron (ZOFRAN) 4 MG tablet, Take 1 tablet (4 mg total) by mouth every 6 (six) hours as needed for nausea. (Patient not taking: Reported on 02/11/2019), Disp: 20 tablet, Rfl: 0 .  ONETOUCH VERIO test strip, TEST BLOOD SUGAR ONCE DAILY, Disp: 100 strip, Rfl: 5 .  pravastatin (PRAVACHOL) 80 MG tablet, TAKE 1 TABLET(80 MG) BY MOUTH EVERY EVENING, Disp: 90 tablet, Rfl: 1 .  traMADol (ULTRAM) 50 MG tablet, Take 1 tablet (50 mg total) by mouth every 6 (six) hours. (Patient not taking: Reported on 08/14/2019), Disp: 30 tablet, Rfl: 0 .  vitamin B-12 (CYANOCOBALAMIN) 1000 MCG tablet, Take 1,000 mcg by mouth daily before lunch. , Disp: , Rfl:   Patient Care  Team: Rubye Beach as PCP - General (Physician Assistant) Lovell Sheehan, MD as Consulting Physician (Orthopedic Surgery) [place], [place] (Optometry)     Objective:    Vitals: There were no vitals taken for this visit.  Physical Exam Vitals reviewed.  Constitutional:      General: She is not in acute distress.    Appearance: Normal appearance. She is well-developed. She is obese. She is not ill-appearing or diaphoretic.  HENT:     Head: Normocephalic and atraumatic.     Right Ear: Tympanic membrane, ear canal and external ear normal.     Left Ear: Tympanic membrane, ear canal and external ear normal.  Eyes:     General: No scleral icterus.       Right eye: No discharge.        Left eye: No discharge.     Extraocular Movements: Extraocular movements intact.     Conjunctiva/sclera: Conjunctivae normal.     Pupils: Pupils are equal, round, and reactive to light.  Neck:     Thyroid: No thyromegaly.     Vascular: No carotid bruit or JVD.     Trachea: No tracheal deviation.  Cardiovascular:     Rate and Rhythm: Normal rate and regular rhythm.     Pulses: Normal pulses.          Dorsalis pedis pulses are 2+ on the right side and 2+ on the left side.       Posterior tibial pulses are 2+ on the right side and 2+ on the left side.     Heart sounds: Normal heart sounds. No murmur. No friction rub. No gallop.   Pulmonary:     Effort: Pulmonary effort is normal. No respiratory distress.     Breath sounds: Normal breath sounds. No wheezing or rales.  Chest:     Chest wall: No tenderness.  Abdominal:     General: Abdomen is protuberant. Bowel sounds are normal. There is no distension.     Palpations: Abdomen is soft. There is no mass.     Tenderness: There is no abdominal tenderness. There is no guarding or rebound.  Musculoskeletal:        General: No tenderness. Normal range of motion.     Cervical back: Normal range of motion and neck supple.     Right lower  leg: Edema (trace) present.     Left lower leg: Edema (trace) present.     Right foot:  Normal range of motion. No deformity or bunion.     Left foot: Normal range of motion. No deformity or bunion.  Feet:     Right foot:     Protective Sensation: 10 sites tested. 10 sites sensed.     Skin integrity: Skin integrity normal.     Toenail Condition: Fungal disease present.    Left foot:     Protective Sensation: 10 sites tested. 8 sites sensed.     Skin integrity: Skin integrity normal.     Toenail Condition: Fungal disease present. Lymphadenopathy:     Cervical: No cervical adenopathy.  Skin:    General: Skin is warm and dry.     Capillary Refill: Capillary refill takes less than 2 seconds.     Findings: No rash.  Neurological:     General: No focal deficit present.     Mental Status: She is alert and oriented to person, place, and time. Mental status is at baseline.  Psychiatric:        Mood and Affect: Mood normal.        Behavior: Behavior normal.        Thought Content: Thought content normal.        Judgment: Judgment normal.     Activities of Daily Living In your present state of health, do you have any difficulty performing the following activities: 08/14/2019 02/11/2019  Hearing? N N  Vision? N N  Difficulty concentrating or making decisions? N N  Walking or climbing stairs? N N  Dressing or bathing? N N  Doing errands, shopping? N -  Preparing Food and eating ? N -  Using the Toilet? N -  In the past six months, have you accidently leaked urine? N -  Do you have problems with loss of bowel control? N -  Managing your Medications? N -  Managing your Finances? N -  Housekeeping or managing your Housekeeping? N -  Some recent data might be hidden    Fall Risk Assessment Fall Risk  08/14/2019 08/12/2018 07/13/2017 07/13/2016  Falls in the past year? 0 0 No No  Number falls in past yr: 0 - - -  Injury with Fall? 0 - - -     Depression Screen PHQ 2/9 Scores 08/14/2019  08/12/2018 08/12/2018 07/13/2017  PHQ - 2 Score 0 0 0 0  PHQ- 9 Score - 1 - -    No flowsheet data found.     Assessment & Plan:    Annual Physical Reviewed patient's Family Medical History Reviewed and updated list of patient's medical providers Assessment of cognitive impairment was done Assessed patient's functional ability Established a written schedule for health screening [place] Completed and Reviewed  Exercise Activities and Dietary recommendations Goals    . DIET - REDUCE SUGAR INTAKE     Recommend cutting out sweets in daily diet. Pt to avoid eating desserts to help aid in weight loss and help diabetes.   08/12/18: Continue watching sugar intake and try switching to sugar free yogurts to substitute for sweets.        Immunization History  Administered Date(s) Administered  . Pneumococcal Conjugate-13 05/19/2013  . Pneumococcal Polysaccharide-23 01/05/2006, 05/03/2009  . Td 08/19/2009  . Tdap 08/19/2009  . Zoster 08/19/2009    Health Maintenance  Topic Date Due  . HEMOGLOBIN A1C  02/10/2019  . OPHTHALMOLOGY EXAM  07/19/2019  . TETANUS/TDAP  08/20/2019  . FOOT EXAM  08/13/2019  . INFLUENZA VACCINE  10/01/2019 (  Originally 02/01/2019)  . COLONOSCOPY  09/27/2019  . DEXA SCAN  11/20/2023  . PNA vac Low Risk Adult  Completed     Discussed health benefits of physical activity, and encouraged her to engage in regular exercise appropriate for her age and condition.    1. Annual physical exam Normal physical exam today. Will check labs as below and f/u pending lab results. If labs are stable and WNL she will not need to have these rechecked for one year at her next annual physical exam. She is to call the office in the meantime if she has any acute issue, questions or concerns. - CBC w/Diff/Platelet - Comprehensive Metabolic Panel (CMET) - Lipid Panel With LDL/HDL Ratio - HgB A1c  2. Type 2 diabetes mellitus with vascular disease  (HCC) Stable. Continue Glipizide '5mg'$  and metformin '1000mg'$  BID. Also on Pravastatin '80mg'$  and ASA '81mg'$ . Will check labs as below and f/u pending results. - aspirin 81 MG chewable tablet; Chew 1 tablet (81 mg total) by mouth daily. (Not twice a day) - CBC w/Diff/Platelet - Comprehensive Metabolic Panel (CMET) - Lipid Panel With LDL/HDL Ratio - HgB A1c  3. Essential hypertension Stable. Continue Losartan '50mg'$ . Will check labs as below and f/u pending results. - CBC w/Diff/Platelet - Comprehensive Metabolic Panel (CMET) - Lipid Panel With LDL/HDL Ratio - HgB A1c  4. Onychomycosis of multiple toenails with type 2 diabetes mellitus and peripheral angiopathy (HCC) Noted on exam. Has seen Podiatry, Dr. Cleda Mccreedy.  - CBC w/Diff/Platelet - Comprehensive Metabolic Panel (CMET) - Lipid Panel With LDL/HDL Ratio - HgB A1c  5. Type 2 diabetes mellitus with diabetic polyneuropathy, without long-term current use of insulin (HCC) Worsening in the left foot since left hip surgery. Already on Gabapentin '400mg'$  TID. Will add duloxetine as below. May increase duloxetine as tolerated. Can also consider increasing gabapentin bedtime dose or changing gabapentin to Lyrica if not improving.  - DULoxetine (CYMBALTA) 20 MG capsule; Take 1 capsule (20 mg total) by mouth daily.  Dispense: 30 capsule; Refill: 1 - CBC w/Diff/Platelet - Comprehensive Metabolic Panel (CMET) - Lipid Panel With LDL/HDL Ratio - HgB A1c  6. Encounter for breast cancer screening using non-mammogram modality There is no family history of breast cancer. She does perform regular self breast exams. Mammogram was ordered as below. Information for [facility] clinic was given to patient so she may schedule her mammogram at her convenience. - MM 3D SCREEN BREAST BILATERAL; Future  7. Polyp of colon, unspecified part of colon, unspecified type Due for 2 year f/u colonoscopy. Referral placed back to Dr. Bonna Gains.  - Ambulatory referral to  Gastroenterology  ------------------------------------------------------------------------------------------------------------    Mar Daring, PA-C  [place]

## 2019-08-28 NOTE — Patient Instructions (Signed)
Health Maintenance After Age 77 After age 77, you are at a higher risk for certain long-term diseases and infections as well as injuries from falls. Falls are a major cause of broken bones and head injuries in people who are older than age 77. Getting regular preventive care can help to keep you healthy and well. Preventive care includes getting regular testing and making lifestyle changes as recommended by your health care provider. Talk with your health care provider about:  Which screenings and tests you should have. A screening is a test that checks for a disease when you have no symptoms.  A diet and exercise plan that is right for you. What should I know about screenings and tests to prevent falls? Screening and testing are the best ways to find a health problem early. Early diagnosis and treatment give you the best chance of managing medical conditions that are common after age 77. Certain conditions and lifestyle choices may make you more likely to have a fall. Your health care provider may recommend:  Regular vision checks. Poor vision and conditions such as cataracts can make you more likely to have a fall. If you wear glasses, make sure to get your prescription updated if your vision changes.  Medicine review. Work with your health care provider to regularly review all of the medicines you are taking, including over-the-counter medicines. Ask your health care provider about any side effects that may make you more likely to have a fall. Tell your health care provider if any medicines that you take make you feel dizzy or sleepy.  Osteoporosis screening. Osteoporosis is a condition that causes the bones to get weaker. This can make the bones weak and cause them to break more easily.  Blood pressure screening. Blood pressure changes and medicines to control blood pressure can make you feel dizzy.  Strength and balance checks. Your health care provider may recommend certain tests to check your  strength and balance while standing, walking, or changing positions.  Foot health exam. Foot pain and numbness, as well as not wearing proper footwear, can make you more likely to have a fall.  Depression screening. You may be more likely to have a fall if you have a fear of falling, feel emotionally low, or feel unable to do activities that you used to do.  Alcohol use screening. Using too much alcohol can affect your balance and may make you more likely to have a fall. What actions can I take to lower my risk of falls? General instructions  Talk with your health care provider about your risks for falling. Tell your health care provider if: ? You fall. Be sure to tell your health care provider about all falls, even ones that seem minor. ? You feel dizzy, sleepy, or off-balance.  Take over-the-counter and prescription medicines only as told by your health care provider. These include any supplements.  Eat a healthy diet and maintain a healthy weight. A healthy diet includes low-fat dairy products, low-fat (lean) meats, and fiber from whole grains, beans, and lots of fruits and vegetables. Home safety  Remove any tripping hazards, such as rugs, cords, and clutter.  Install safety equipment such as grab bars in bathrooms and safety rails on stairs.  Keep rooms and walkways well-lit. Activity   Follow a regular exercise program to stay fit. This will help you maintain your balance. Ask your health care provider what types of exercise are appropriate for you.  If you need a cane or   walker, use it as recommended by your health care provider.  Wear supportive shoes that have nonskid soles. Lifestyle  Do not drink alcohol if your health care provider tells you not to drink.  If you drink alcohol, limit how much you have: ? 0-1 drink a day for women. ? 0-2 drinks a day for men.  Be aware of how much alcohol is in your drink. In the [place] one drink equals one typical bottle of beer (12  oz), one-half glass of wine (5 oz), or one shot of hard liquor (1 oz).  Do not use any products that contain nicotine or tobacco, such as cigarettes and e-cigarettes. If you need help quitting, ask your health care provider. Summary  Having a healthy lifestyle and getting preventive care can help to protect your health and wellness after age 77.  Screening and testing are the best way to find a health problem early and help you avoid having a fall. Early diagnosis and treatment give you the best chance for managing medical conditions that are more common for people who are older than age 77.  Falls are a major cause of broken bones and head injuries in people who are older than age 77. Take precautions to prevent a fall at home.  Work with your health care provider to learn what changes you can make to improve your health and wellness and to prevent falls. This information is not intended to replace advice given to you by your health care provider. Make sure you discuss any questions you have with your health care provider. Document Revised: 10/10/2018 Document Reviewed: 05/02/2017 Elsevier Patient Education  2020 [employer] Inc[employer]  

## 2019-08-29 LAB — CBC WITH DIFFERENTIAL/PLATELET
Basophils Absolute: 0.1 10*3/uL (ref 0.0–0.2)
Basos: 1 %
EOS (ABSOLUTE): 0.3 10*3/uL (ref 0.0–0.4)
Eos: 4 %
Hematocrit: 38.3 % (ref 34.0–46.6)
Hemoglobin: 12.9 g/dL (ref 11.1–15.9)
Immature Grans (Abs): 0 10*3/uL (ref 0.0–0.1)
Immature Granulocytes: 0 %
Lymphocytes Absolute: 1.6 10*3/uL (ref 0.7–3.1)
Lymphs: 25 %
MCH: 31.5 pg (ref 26.6–33.0)
MCHC: 33.7 g/dL (ref 31.5–35.7)
MCV: 94 fL (ref 79–97)
Monocytes Absolute: 0.5 10*3/uL (ref 0.1–0.9)
Monocytes: 8 %
Neutrophils Absolute: 3.9 10*3/uL (ref 1.4–7.0)
Neutrophils: 62 %
Platelets: 281 10*3/uL (ref 150–450)
RBC: 4.09 x10E6/uL (ref 3.77–5.28)
RDW: 13 % (ref 11.7–15.4)
WBC: 6.3 10*3/uL (ref 3.4–10.8)

## 2019-08-29 LAB — COMPREHENSIVE METABOLIC PANEL
ALT: 15 IU/L (ref 0–32)
AST: 17 IU/L (ref 0–40)
Albumin/Globulin Ratio: 1.7 (ref 1.2–2.2)
Albumin: 4 g/dL (ref 3.7–4.7)
Alkaline Phosphatase: 69 IU/L (ref 39–117)
BUN/Creatinine Ratio: 19 (ref 12–28)
BUN: 12 mg/dL (ref 8–27)
Bilirubin Total: 0.4 mg/dL (ref 0.0–1.2)
CO2: 24 mmol/L (ref 20–29)
Calcium: 10.1 mg/dL (ref 8.7–10.3)
Chloride: 107 mmol/L — ABNORMAL HIGH (ref 96–106)
Creatinine, Ser: 0.63 mg/dL (ref 0.57–1.00)
GFR calc Af Amer: 101 mL/min/{1.73_m2} (ref >59)
GFR calc non Af Amer: 87 mL/min/{1.73_m2} (ref >59)
Globulin, Total: 2.4 g/dL (ref 1.5–4.5)
Glucose: 145 mg/dL — ABNORMAL HIGH (ref 65–99)
Potassium: 4.6 mmol/L (ref 3.5–5.2)
Sodium: 147 mmol/L — ABNORMAL HIGH (ref 134–144)
Total Protein: 6.4 g/dL (ref 6.0–8.5)

## 2019-08-29 LAB — LIPID PANEL WITH LDL/HDL RATIO
Cholesterol, Total: 167 mg/dL (ref 100–199)
HDL: 77 mg/dL (ref >39)
LDL Chol Calc (NIH): 72 mg/dL (ref 0–99)
LDL/HDL Ratio: 0.9 ratio (ref 0.0–3.2)
Triglycerides: 102 mg/dL (ref 0–149)
VLDL Cholesterol Cal: 18 mg/dL (ref 5–40)

## 2019-08-29 LAB — HEMOGLOBIN A1C
Est. average glucose Bld gHb Est-mCnc: 134 mg/dL
Hgb A1c MFr Bld: 6.3 % — ABNORMAL HIGH (ref 4.8–5.6)

## 2019-09-01 ENCOUNTER — Other Ambulatory Visit: Payer: Self-pay

## 2019-09-01 ENCOUNTER — Telehealth: Payer: Self-pay

## 2019-09-01 DIAGNOSIS — K635 Polyp of colon: Secondary | ICD-10-CM

## 2019-09-01 NOTE — Telephone Encounter (Signed)
-----   Message from Mar Daring, [place] sent at 08/29/2019  7:21 PM EST ----- Blood count is normal. Kidney function is normal. Liver enzymes normal. Sodium borderline high. Limit salt in diet. Potassium and calcium are normal. Cholesterol is doing well. A1c improved to 6.3 from 6.7! Keep up the good work.

## 2019-09-01 NOTE — Telephone Encounter (Signed)
Pt. Called back, given results and instructions.Verbalizes understanding. 

## 2019-09-01 NOTE — Telephone Encounter (Signed)
LMTCB 09/01/2019.  PEC please advise pt of lab results below.   Thanks,   -Mickel Baas

## 2019-09-01 NOTE — Telephone Encounter (Signed)
Gastroenterology Pre-Procedure Review  Request Date: Thursday April 15th Requesting Physician: Dr. Bonna Gains  PATIENT REVIEW QUESTIONS: The patient responded to the following health history questions as indicated:    1. Are you having any GI issues? yes (stomach pain, however declined offer for office visit) 2. Do you have a personal history of Polyps? yes (2 years ago) 3. Do you have a family history of Colon Cancer or Polyps? no 4. Diabetes Mellitus? Yes takes oral  meds 5. Joint replacements in the past 12 months?pt states hip replacement summer 2020 6. Major health problems in the past 3 months?no 7. Any artificial heart valves, MVP, or defibrillator?no    MEDICATIONS & ALLERGIES:    Patient reports the following regarding taking any anticoagulation/antiplatelet therapy:   Plavix, Coumadin, Eliquis, Xarelto, Lovenox, Pradaxa, Brilinta, or Effient? no Aspirin? yes (81 mg daily)  Patient confirms/reports the following medications:  Current Outpatient Medications  Medication Sig Dispense Refill  . acetaminophen (TYLENOL) 500 MG tablet Take 1,000 mg by mouth daily as needed for moderate pain or headache.    Marland Kitchen aspirin 81 MG chewable tablet Chew 1 tablet (81 mg total) by mouth daily. (Not twice a day)    . Blood Glucose Monitoring Suppl (ONETOUCH VERIO IQ SYSTEM) w/Device KIT Check blood sugar a.c.h.s. 1 kit 0  . Calcium Carb-Cholecalciferol (CALCIUM 1000 + D PO) Take 3 tablets by mouth daily before lunch.    . docusate sodium (COLACE) 100 MG capsule Take 1 capsule (100 mg total) by mouth 2 (two) times daily. (Patient not taking: Reported on 02/05/2019) 20 capsule 0  . DULoxetine (CYMBALTA) 20 MG capsule Take 1 capsule (20 mg total) by mouth daily. 30 capsule 1  . gabapentin (NEURONTIN) 400 MG capsule Take 1 capsule (400 mg total) by mouth 3 (three) times daily. 90 capsule 5  . glipiZIDE (GLUCOTROL) 5 MG tablet TAKE 1/2 TABLET BY MOUTH EVERY DAY BEFORE SUPPER 45 tablet 0  . Lancets  (ONETOUCH ULTRASOFT) lancets Use as instructed 100 each 12  . losartan (COZAAR) 50 MG tablet TAKE 1 TABLET(50 MG) BY MOUTH EVERY EVENING 30 tablet 0  . metFORMIN (GLUCOPHAGE) 1000 MG tablet TAKE 1 TABLET(1000 MG) BY MOUTH TWICE DAILY WITH A MEAL 180 tablet 1  . Multiple Vitamins-Minerals (ADULT GUMMY PO) Take 2 tablets by mouth daily before lunch.    . Omega-3 Fatty Acids (FISH OIL PO) Take 2 capsules by mouth daily before lunch.     [name] VERIO test strip TEST BLOOD SUGAR ONCE DAILY 100 strip 5  . pravastatin (PRAVACHOL) 80 MG tablet TAKE 1 TABLET(80 MG) BY MOUTH EVERY EVENING 90 tablet 1  . vitamin B-12 (CYANOCOBALAMIN) 1000 MCG tablet Take 1,000 mcg by mouth daily before lunch.      No current facility-administered medications for this visit.    Patient confirms/reports the following allergies:  Allergies  Allergen Reactions  . Citrus Other (See Comments)    Rash in mouth and on arms    No orders of the defined types were placed in this encounter.   AUTHORIZATION INFORMATION Primary Insurance: 1D#: Group #:  Secondary Insurance: 1D#: Group #:  SCHEDULE INFORMATION: Date: Thursday 10/16/19 Time: Location:[facility]

## 2019-09-09 ENCOUNTER — Other Ambulatory Visit: Payer: Self-pay | Admitting: Physician Assistant

## 2019-09-09 DIAGNOSIS — M1990 Unspecified osteoarthritis, unspecified site: Secondary | ICD-10-CM

## 2019-09-09 NOTE — Telephone Encounter (Signed)
Requested Prescriptions  Pending Prescriptions Disp Refills  . gabapentin (NEURONTIN) 400 MG capsule [Pharmacy Med Name: GABAPENTIN 400MG  CAPSULES] 270 capsule 1    Sig: TAKE 1 CAPSULE(400 MG) BY MOUTH THREE TIMES DAILY     Neurology: Anticonvulsants - gabapentin Passed - 09/09/2019  3:39 AM      Passed - Valid encounter within last 12 months    Recent Outpatient Visits          1 week ago Annual physical exam   [place], [place]   8 months ago Preoperative clearance   [place], Clearnce Sorrel, [place]   1 year ago Annual physical exam   [place], [place]   1 year ago Chronic bilateral low back pain with bilateral sciatica   [place], [place]   1 year ago Gastroesophageal reflux disease without esophagitis   [place], PA-C      Future Appointments            In 5 months Burnette, Clearnce Sorrel, PA-C [employer], [place]

## 2019-09-17 ENCOUNTER — Other Ambulatory Visit: Payer: Self-pay | Admitting: Physician Assistant

## 2019-09-17 DIAGNOSIS — E119 Type 2 diabetes mellitus without complications: Secondary | ICD-10-CM

## 2019-09-17 NOTE — Telephone Encounter (Signed)
Requested Prescriptions  Pending Prescriptions Disp Refills  . metFORMIN (GLUCOPHAGE) 1000 MG tablet [Pharmacy Med Name: METFORMIN '1000MG'$  TABLETS] 180 tablet 1    Sig: TAKE 1 TABLET(1000 MG) BY MOUTH TWICE DAILY WITH A MEAL     Endocrinology:  Diabetes - Biguanides Passed - 09/17/2019  3:39 AM      Passed - Cr in normal range and within 360 days    Creatinine, Ser  Date Value Ref Range Status  08/28/2019 0.63 0.57 - 1.00 mg/dL Final         Passed - HBA1C is between 0 and 7.9 and within 180 days    Hgb A1c MFr Bld  Date Value Ref Range Status  08/28/2019 6.3 (H) 4.8 - 5.6 % Final    Comment:             Prediabetes: 5.7 - 6.4          Diabetes: >6.4          Glycemic control for adults with diabetes: <7.0          Passed - eGFR in normal range and within 360 days    GFR calc Af Amer  Date Value Ref Range Status  08/28/2019 101 >59 mL/min/1.73 Final   GFR calc non Af Amer  Date Value Ref Range Status  08/28/2019 87 >59 mL/min/1.73 Final         Passed - Valid encounter within last 6 months    Recent Outpatient Visits          2 weeks ago Annual physical exam   [place], [place]   8 months ago Preoperative clearance   [place], Clearnce Sorrel, [place]   1 year ago Annual physical exam   [place], [place], [place]   1 year ago Chronic bilateral low back pain with bilateral sciatica   [place], [place]   1 year ago Gastroesophageal reflux disease without esophagitis   [place], Clearnce Sorrel, [place]      Future Appointments            In 5 months Burnette, Clearnce Sorrel, PA-C [employer], [place]

## 2019-09-20 ENCOUNTER — Other Ambulatory Visit: Payer: Self-pay | Admitting: Physician Assistant

## 2019-09-20 DIAGNOSIS — I10 Essential (primary) hypertension: Secondary | ICD-10-CM

## 2019-10-14 ENCOUNTER — Other Ambulatory Visit
Admission: RE | Admit: 2019-10-14 | Discharge: 2019-10-14 | Disposition: A | Discharge status: Home / Self Care | Payer: Medicare Other | Source: Ambulatory Visit | Attending: Gastroenterology | Admitting: Gastroenterology

## 2019-10-14 DIAGNOSIS — Z20822 Contact with and (suspected) exposure to covid-19: Secondary | ICD-10-CM | POA: Insufficient documentation

## 2019-10-14 DIAGNOSIS — Z01812 Encounter for preprocedural laboratory examination: Secondary | ICD-10-CM | POA: Insufficient documentation

## 2019-10-14 LAB — SARS CORONAVIRUS 2 (TAT 6-24 HRS): SARS Coronavirus 2: NEGATIVE

## 2019-10-16 ENCOUNTER — Ambulatory Visit: Payer: Medicare Other | Admitting: Anesthesiology

## 2019-10-16 ENCOUNTER — Other Ambulatory Visit: Payer: Self-pay

## 2019-10-16 ENCOUNTER — Encounter
Admission: RE | Disposition: A | Discharge status: Home / Self Care | Payer: Self-pay | Source: Home / Self Care | Attending: Gastroenterology

## 2019-10-16 ENCOUNTER — Ambulatory Visit
Admission: RE | Admit: 2019-10-16 | Discharge: 2019-10-16 | Disposition: A | Discharge status: Home / Self Care | Payer: Medicare Other | Attending: Gastroenterology | Admitting: Gastroenterology

## 2019-10-16 ENCOUNTER — Encounter: Payer: Self-pay | Admitting: Gastroenterology

## 2019-10-16 DIAGNOSIS — Z961 Presence of intraocular lens: Secondary | ICD-10-CM | POA: Insufficient documentation

## 2019-10-16 DIAGNOSIS — E785 Hyperlipidemia, unspecified: Secondary | ICD-10-CM | POA: Insufficient documentation

## 2019-10-16 DIAGNOSIS — Z7982 Long term (current) use of aspirin: Secondary | ICD-10-CM | POA: Insufficient documentation

## 2019-10-16 DIAGNOSIS — K635 Polyp of colon: Secondary | ICD-10-CM | POA: Diagnosis not present

## 2019-10-16 DIAGNOSIS — D122 Benign neoplasm of ascending colon: Secondary | ICD-10-CM | POA: Diagnosis not present

## 2019-10-16 DIAGNOSIS — D120 Benign neoplasm of cecum: Secondary | ICD-10-CM | POA: Diagnosis not present

## 2019-10-16 DIAGNOSIS — Z79899 Other long term (current) drug therapy: Secondary | ICD-10-CM | POA: Insufficient documentation

## 2019-10-16 DIAGNOSIS — I10 Essential (primary) hypertension: Secondary | ICD-10-CM | POA: Diagnosis not present

## 2019-10-16 DIAGNOSIS — Z86010 Personal history of colonic polyps: Secondary | ICD-10-CM | POA: Diagnosis not present

## 2019-10-16 DIAGNOSIS — K641 Second degree hemorrhoids: Secondary | ICD-10-CM | POA: Diagnosis not present

## 2019-10-16 DIAGNOSIS — Z1211 Encounter for screening for malignant neoplasm of colon: Secondary | ICD-10-CM | POA: Insufficient documentation

## 2019-10-16 DIAGNOSIS — Z91018 Allergy to other foods: Secondary | ICD-10-CM | POA: Diagnosis not present

## 2019-10-16 DIAGNOSIS — M1990 Unspecified osteoarthritis, unspecified site: Secondary | ICD-10-CM | POA: Insufficient documentation

## 2019-10-16 DIAGNOSIS — Z96643 Presence of artificial hip joint, bilateral: Secondary | ICD-10-CM | POA: Diagnosis not present

## 2019-10-16 DIAGNOSIS — E119 Type 2 diabetes mellitus without complications: Secondary | ICD-10-CM | POA: Diagnosis not present

## 2019-10-16 DIAGNOSIS — Z9841 Cataract extraction status, right eye: Secondary | ICD-10-CM | POA: Insufficient documentation

## 2019-10-16 DIAGNOSIS — Z7984 Long term (current) use of oral hypoglycemic drugs: Secondary | ICD-10-CM | POA: Diagnosis not present

## 2019-10-16 DIAGNOSIS — Z9842 Cataract extraction status, left eye: Secondary | ICD-10-CM | POA: Insufficient documentation

## 2019-10-16 HISTORY — PX: COLONOSCOPY WITH PROPOFOL: SHX5780

## 2019-10-16 LAB — GLUCOSE, CAPILLARY: Glucose-Capillary: 153 mg/dL — ABNORMAL HIGH (ref 70–99)

## 2019-10-16 SURGERY — COLONOSCOPY WITH PROPOFOL
Anesthesia: General

## 2019-10-16 MED ORDER — ONDANSETRON HCL 4 MG/2ML IJ SOLN: 4.0000 mg | Freq: Once | INTRAMUSCULAR | Status: DC | PRN

## 2019-10-16 MED ORDER — LIDOCAINE HCL (CARDIAC) PF 100 MG/5ML IV SOSY
PREFILLED_SYRINGE | INTRAVENOUS | Status: DC | PRN
  Administered 2019-10-16: 100 mg via INTRATRACHEAL

## 2019-10-16 MED ORDER — SODIUM CHLORIDE 0.9 % IV SOLN
INTRAVENOUS | Status: DC
  Administered 2019-10-16: 13:00:00 1000 mL via INTRAVENOUS

## 2019-10-16 MED ORDER — SODIUM CHLORIDE 0.9 % IV SOLN: INTRAVENOUS | Status: DC | PRN

## 2019-10-16 MED ORDER — PROPOFOL 10 MG/ML IV BOLUS
INTRAVENOUS | Status: DC | PRN
  Administered 2019-10-16: 10 mg via INTRAVENOUS
  Administered 2019-10-16: 40 mg via INTRAVENOUS
  Administered 2019-10-16: 10 mg via INTRAVENOUS

## 2019-10-16 MED ORDER — PROPOFOL 500 MG/50ML IV EMUL
INTRAVENOUS | Status: DC | PRN
  Administered 2019-10-16: 145 ug/kg/min via INTRAVENOUS

## 2019-10-16 MED ORDER — FENTANYL CITRATE (PF) 100 MCG/2ML IJ SOLN: 25.0000 ug | INTRAMUSCULAR | Status: DC | PRN

## 2019-10-16 NOTE — Anesthesia Postprocedure Evaluation (Signed)
Anesthesia Post Note  Patient: Dentist  Procedure(s) Performed: COLONOSCOPY WITH PROPOFOL (N/A )  Patient location during evaluation: Endoscopy Anesthesia Type: General Level of consciousness: awake and alert and oriented Pain management: pain level controlled Vital Signs Assessment: post-procedure vital signs reviewed and stable Respiratory status: spontaneous breathing Cardiovascular status: blood pressure returned to baseline Anesthetic complications: no     Last Vitals:  Vitals:   10/16/19 1356 10/16/19 1404  BP: (!) 164/83 (!) 171/75  Pulse: 72 83  Resp: 14 (!) 28  Temp:    SpO2: 100% 99%    Last Pain:  Vitals:   10/16/19 1404  TempSrc:   PainSc: 0-No pain                 Britiany Silbernagel

## 2019-10-16 NOTE — Anesthesia Preprocedure Evaluation (Addendum)
Anesthesia Evaluation  Patient identified by MRN, date of birth, ID band Patient awake    Reviewed: Allergy & Precautions, H&P , NPO status , Patient's Chart, lab work & pertinent test results, reviewed documented beta blocker date and time   Airway Mallampati: II  TM Distance: >3 FB Neck ROM: full    Dental no notable dental hx. (+) Upper Dentures, Lower Dentures   Pulmonary shortness of breath, Patient did not abstain from smoking.,    Pulmonary exam normal breath sounds clear to auscultation       Cardiovascular Exercise Tolerance: Poor hypertension, On Medications + Peripheral Vascular Disease   Rhythm:regular Rate:Normal     Neuro/Psych negative neurological ROS  negative psych ROS   GI/Hepatic hiatal hernia, GERD  ,(+) Hepatitis -  Endo/Other  negative endocrine ROSdiabetes  Renal/GU      Musculoskeletal   Abdominal   Peds  Hematology negative hematology ROS (+)   Anesthesia Other Findings   Reproductive/Obstetrics negative OB ROS                            Anesthesia Physical  Anesthesia Plan  ASA: III  Anesthesia Plan: General   Post-op Pain Management:    Induction:   PONV Risk Score and Plan: 2 and Propofol infusion  Airway Management Planned: Nasal Cannula  Additional Equipment:   Intra-op Plan:   Post-operative Plan:   Informed Consent: I have reviewed the patients History and Physical, chart, labs and discussed the procedure including the risks, benefits and alternatives for the proposed anesthesia with the patient or authorized representative who has indicated his/her understanding and acceptance.       Plan Discussed with: CRNA  Anesthesia Plan Comments:         Anesthesia Quick Evaluation

## 2019-10-16 NOTE — Transfer of Care (Signed)
Immediate Anesthesia Transfer of Care Note  Patient: Kathleen Tran  Procedure(s) Performed: COLONOSCOPY WITH PROPOFOL (N/A )  Patient Location: Endoscopy Unit  Anesthesia Type:General  Level of Consciousness: drowsy, patient cooperative and responds to stimulation  Airway & Oxygen Therapy: Patient Spontanous Breathing and Patient connected to face mask oxygen  Post-op Assessment: Report given to RN and Post -op Vital signs reviewed and stable  Post vital signs: Reviewed and stable  Last Vitals:  Vitals Value Taken Time  BP 136/67 10/16/19 1336  Temp    Pulse 87 10/16/19 1337  Resp 15 10/16/19 1337  SpO2 100 % 10/16/19 1337  Vitals shown include unvalidated device data.  Last Pain:  Vitals:   10/16/19 1246  TempSrc: Temporal  PainSc: 0-No pain      Patients Stated Pain Goal: 0 ([id number])  Complications: No apparent anesthesia complications

## 2019-10-16 NOTE — H&P (Signed)
 Darren Wohl, MD FACG [address] [place], [place] [address] Phone:[phone] Fax : [phone]  Primary Care Physician:  Burnette, Jennifer M, PA-C Primary Gastroenterologist:  Dr. Wohl  Pre-Procedure History & Physical: HPI:  Kathleen Tran is a 79 y.o. female is here for an colonoscopy.   Past Medical History:  Diagnosis Date  . Arthritis   . Diabetes mellitus without complication (HCC)   . Dyspnea   . GERD (gastroesophageal reflux disease)   . Hepatitis    at age 14  . History of herniated intervertebral disc    lower back  . History of hiatal hernia   . Hyperlipidemia   . Hypertension   . Tremors of nervous system    HEAD    Past Surgical History:  Procedure Laterality Date  . APPENDECTOMY    . CATARACT EXTRACTION W/PHACO Left 09/11/2018   Procedure: CATARACT EXTRACTION PHACO AND INTRAOCULAR LENS PLACEMENT (IOC) LEFT, DIABETIC;  Surgeon: Brasington, Chadwick, MD;  Location: [facility] ORS;  Service: Ophthalmology;  Laterality: Left;  US  01:03 AP% 7.9 CDE 6.89 Fluid pack lot # [id number]  . CATARACT EXTRACTION W/PHACO Right 02/11/2019   Procedure: CATARACT EXTRACTION PHACO AND INTRAOCULAR LENS PLACEMENT (IOC) RIGHT, DIABETIC;  Surgeon: Brasington, Chadwick, MD;  Location: [facility] ORS;  Service: Ophthalmology;  Laterality: Right;  US 00:51 AP% 11.6 CDE 8.20 fluid pack lot # [id number]  . COLONOSCOPY WITH PROPOFOL N/A 09/26/2017   Procedure: COLONOSCOPY WITH PROPOFOL;  Surgeon: Tahiliani, Varnita B, MD;  Location: [facility] ENDOSCOPY;  Service: Endoscopy;  Laterality: N/A;  . CYST EXCISION     "base of spine"  . EYE SURGERY Left    cataract extraction  . JOINT REPLACEMENT Left    hip replacement  . PARTIAL HIP ARTHROPLASTY Right   . TONSILLECTOMY    . TOTAL HIP ARTHROPLASTY Left 01/08/2019   Procedure: TOTAL HIP ARTHROPLASTY ANTERIOR APPROACH;  Surgeon: Bowers, James R, MD;  Location: [facility] ORS;  Service: Orthopedics;  Laterality: Left;    Prior to Admission  medications   Medication Sig Start Date End Date Taking? Authorizing Provider  acetaminophen (TYLENOL) 500 MG tablet Take 1,000 mg by mouth daily as needed for moderate pain or headache.   Yes [provider]  aspirin 81 MG chewable tablet Chew 1 tablet (81 mg total) by mouth daily. (Not twice a day) 08/28/19  Yes Burnette, Jennifer M, PA-C  Blood Glucose Monitoring Suppl (ONETOUCH VERIO IQ SYSTEM) w/Device KIT Check blood sugar a.c.h.s. 06/11/19  Yes Burnette, Jennifer M, PA-C  Calcium Carb-Cholecalciferol (CALCIUM 1000 + D PO) Take 3 tablets by mouth daily before lunch.   Yes [provider]  DULoxetine (CYMBALTA) 20 MG capsule Take 1 capsule (20 mg total) by mouth daily. 08/28/19  Yes Burnette, Jennifer M, PA-C  gabapentin (NEURONTIN) 400 MG capsule TAKE 1 CAPSULE(400 MG) BY MOUTH THREE TIMES DAILY 09/09/19  Yes Burnette, Jennifer M, PA-C  glipiZIDE (GLUCOTROL) 5 MG tablet TAKE 1/2 TABLET BY MOUTH EVERY DAY BEFORE SUPPER 08/08/19  Yes Burnette, Jennifer M, PA-C  Lancets (ONETOUCH ULTRASOFT) lancets Use as instructed 07/14/19  Yes Burnette, Jennifer M, PA-C  losartan (COZAAR) 50 MG tablet TAKE 1 TABLET(50 MG) BY MOUTH EVERY EVENING 09/20/19  Yes Burnette, Jennifer M, PA-C  metFORMIN (GLUCOPHAGE) 1000 MG tablet TAKE 1 TABLET(1000 MG) BY MOUTH TWICE DAILY WITH A MEAL 09/17/19  Yes Burnette, Jennifer M, PA-C  Multiple Vitamins-Minerals (ADULT GUMMY PO) Take 2 tablets by mouth daily before lunch.   Yes [provider]    Omega-3 Fatty Acids (FISH OIL PO) Take 2 capsules by mouth daily before lunch.    Yes [provider]  ONETOUCH VERIO test strip TEST BLOOD SUGAR ONCE DAILY 01/28/19  Yes Burnette, Jennifer M, PA-C  pravastatin (PRAVACHOL) 80 MG tablet TAKE 1 TABLET(80 MG) BY MOUTH EVERY EVENING 05/26/19  Yes Burnette, Jennifer M, PA-C  vitamin B-12 (CYANOCOBALAMIN) 1000 MCG tablet Take 1,000 mcg by mouth daily before lunch.    Yes [provider]  docusate sodium  (COLACE) 100 MG capsule Take 1 capsule (100 mg total) by mouth 2 (two) times daily. Patient not taking: Reported on 02/05/2019 01/10/19   Jones, Maurice, PA-C    Allergies as of 09/01/2019 - Review Complete 08/28/2019  Allergen Reaction Noted  . Citrus Other (See Comments) 09/26/2017    History reviewed. No pertinent family history.  Social History   Socioeconomic History  . Marital status: Married    Spouse name: william ...son  . Number of children: 6  . Years of education: Not on file  . Highest education level: GED or equivalent  Occupational History  . Occupation: retired  Tobacco Use  . Smoking status: Never Smoker  . Smokeless tobacco: Never Used  Substance and Sexual Activity  . Alcohol use: No  . Drug use: No  . Sexual activity: Not on file  Other Topics Concern  . Not on file  Social History Narrative  . Not on file   Social Determinants of Health   Financial Resource Strain: Low Risk   . Difficulty of Paying Living Expenses: Not hard at all  Food Insecurity: No Food Insecurity  . Worried About Running Out of Food in the Last Year: Never true  . Ran Out of Food in the Last Year: Never true  Transportation Needs: No Transportation Needs  . Lack of Transportation (Medical): No  . Lack of Transportation (Non-Medical): No  Physical Activity: Inactive  . Days of Exercise per Week: 0 days  . Minutes of Exercise per Session: 0 min  Stress: No Stress Concern Present  . Feeling of Stress : Only a little  Social Connections: Somewhat Isolated  . Frequency of Communication with Friends and Family: Once a week  . Frequency of Social Gatherings with Friends and Family: Never  . Attends Religious Services: More than 4 times per year  . Active Member of Clubs or Organizations: No  . Attends Club or Organization Meetings: Never  . Marital Status: Married  Intimate Partner Violence: Not At Risk  . Fear of Current or Ex-Partner: No  . Emotionally Abused: No  .  Physically Abused: No  . Sexually Abused: No    Review of Systems: See HPI, otherwise negative ROS  Physical Exam: BP (!) 151/75   Temp (!) 97.3 F (36.3 C) (Temporal)   Resp 20   Ht 5' 4" (1.626 m)   Wt 84.4 kg   SpO2 99%   BMI 31.93 kg/m  General:   Alert,  pleasant and cooperative in NAD Head:  Normocephalic and atraumatic. Neck:  Supple; no masses or thyromegaly. Lungs:  Clear throughout to auscultation.    Heart:  Regular rate and rhythm. Abdomen:  Soft, nontender and nondistended. Normal bowel sounds, without guarding, and without rebound.   Neurologic:  Alert and  oriented x4;  grossly normal neurologically.  Impression/Plan: Kathleen Tran is here for an colonoscopy to be performed for history of multiple adenomatous colon polyps 08/2017  Risks, benefits, limitations, and alternatives regarding  colonoscopy have been   reviewed with the patient.  Questions have been answered.  All parties agreeable.   Lucilla Lame, MD  10/16/2019, 1:08 PM

## 2019-10-16 NOTE — Op Note (Signed)
[facility] Gastroenterology Patient Name: Kathleen Tran Procedure Date: 10/16/2019 12:37 PM MRN: [id number] Account #: [id number] Date of Birth: May 15, 1941 Admit Type: Outpatient Age: 79 Room: [facility] ENDO ROOM 1 Gender: Female Note Status: Finalized Procedure:             Colonoscopy Indications:           High risk colon cancer surveillance: Personal history                         of colonic polyps Providers:             Lucilla Lame MD, MD Medicines:             Propofol per Anesthesia Complications:         No immediate complications. Procedure:             Pre-Anesthesia Assessment:                        - Prior to the procedure, a History and Physical was                         performed, and patient medications and allergies were                         reviewed. The patient's tolerance of previous                         anesthesia was also reviewed. The risks and benefits                         of the procedure and the sedation options and risks                         were discussed with the patient. All questions were                         answered, and informed consent was obtained. Prior                         Anticoagulants: The patient has taken no previous                         anticoagulant or antiplatelet agents. ASA Grade                         Assessment: II - A patient with mild systemic disease.                         After reviewing the risks and benefits, the patient                         was deemed in satisfactory condition to undergo the                         procedure.                        After obtaining informed consent, the colonoscope was  passed under direct vision. Throughout the procedure,                         the patient's blood pressure, pulse, and oxygen                         saturations were monitored continuously. The                         Colonoscope was introduced through the anus  and                         advanced to the the cecum, identified by appendiceal                         orifice and ileocecal valve. The colonoscopy was                         performed without difficulty. The patient tolerated                         the procedure well. The quality of the bowel                         preparation was excellent. Findings:      The perianal and digital rectal examinations were normal.      Two sessile polyps were found in the cecum. The polyps were 2 to 3 mm in       size. These polyps were removed with a cold biopsy forceps. Resection       and retrieval were complete.      Two sessile polyps were found in the ascending colon. The polyps were 2       to 3 mm in size. These polyps were removed with a cold biopsy forceps.       Resection and retrieval were complete.      Non-bleeding internal hemorrhoids were found during retroflexion. The       hemorrhoids were Grade II (internal hemorrhoids that prolapse but reduce       spontaneously). Impression:            - Two 2 to 3 mm polyps in the cecum, removed with a                         cold biopsy forceps. Resected and retrieved.                        - Two 2 to 3 mm polyps in the ascending colon, removed                         with a cold biopsy forceps. Resected and retrieved.                        - Non-bleeding internal hemorrhoids. Recommendation:        - Discharge patient to home.                        - Resume previous diet.                        -  Continue present medications.                        - Await pathology results. Procedure Code(s):     --- Professional ---                        [id number], Colonoscopy, flexible; with biopsy, single or                         multiple Diagnosis Code(s):     --- Professional ---                        Z86.010, Personal history of colonic polyps                        K63.5, Polyp of colon CPT copyright 2019 American Medical Association. All rights  reserved. The codes documented in this report are preliminary and upon coder review may  be revised to meet current compliance requirements. Lucilla Lame MD, MD 10/16/2019 1:37:01 PM This report has been signed electronically. Number of Addenda: 0 Note Initiated On: 10/16/2019 12:37 PM Scope Withdrawal Time: 0 hours 10 minutes 20 seconds  Total Procedure Duration: 0 hours 14 minutes 24 seconds  Estimated Blood Loss:  Estimated blood loss: none.      [facility]

## 2019-10-17 ENCOUNTER — Encounter: Payer: Self-pay | Admitting: *Deleted

## 2019-10-20 LAB — SURGICAL PATHOLOGY

## 2019-10-21 ENCOUNTER — Encounter: Payer: Self-pay | Admitting: Gastroenterology

## 2019-10-25 ENCOUNTER — Other Ambulatory Visit: Payer: Self-pay | Admitting: Physician Assistant

## 2019-10-25 DIAGNOSIS — E1142 Type 2 diabetes mellitus with diabetic polyneuropathy: Secondary | ICD-10-CM

## 2019-10-27 LAB — HM DIABETES EYE EXAM

## 2019-10-30 ENCOUNTER — Encounter: Payer: Self-pay | Admitting: Physician Assistant

## 2019-11-05 ENCOUNTER — Other Ambulatory Visit: Payer: Self-pay | Admitting: Physician Assistant

## 2019-11-05 DIAGNOSIS — E119 Type 2 diabetes mellitus without complications: Secondary | ICD-10-CM

## 2019-11-05 NOTE — Telephone Encounter (Signed)
Requested Prescriptions  Pending Prescriptions Disp Refills  . glipiZIDE (GLUCOTROL) 5 MG tablet [Pharmacy Med Name: GLIPIZIDE 5MG  TABLETS] 45 tablet 0    Sig: TAKE 1/2 TABLET BY MOUTH EVERY DAY BEFORE SUPPER     Endocrinology:  Diabetes - Sulfonylureas Passed - 11/05/2019  7:22 AM      Passed - HBA1C is between 0 and 7.9 and within 180 days    Hgb A1c MFr Bld  Date Value Ref Range Status  08/28/2019 6.3 (H) 4.8 - 5.6 % Final    Comment:             Prediabetes: 5.7 - 6.4          Diabetes: >6.4          Glycemic control for adults with diabetes: <7.0          Passed - Valid encounter within last 6 months    Recent Outpatient Visits          2 months ago Annual physical exam   [facility] [name], [place]   10 months ago Preoperative clearance   [place], Clearnce Sorrel, [place]   1 year ago Annual physical exam   [place], [place]   1 year ago Chronic bilateral low back pain with bilateral sciatica   [place], [place]   1 year ago Gastroesophageal reflux disease without esophagitis   [facility] [place], Clearnce Sorrel, PA-C      Future Appointments            In 3 months Burnette, Clearnce Sorrel, PA-C [employer], [place]

## 2019-11-19 ENCOUNTER — Other Ambulatory Visit: Payer: Self-pay | Admitting: Physician Assistant

## 2019-11-19 DIAGNOSIS — E7800 Pure hypercholesterolemia, unspecified: Secondary | ICD-10-CM

## 2019-11-19 NOTE — Telephone Encounter (Signed)
Requested Prescriptions  Pending Prescriptions Disp Refills  . pravastatin (PRAVACHOL) 80 MG tablet [Pharmacy Med Name: PRAVASTATIN 80MG  TABLETS] 90 tablet 3    Sig: TAKE 1 TABLET(80 MG) BY MOUTH EVERY EVENING     Cardiovascular:  Antilipid - Statins Failed - 11/19/2019  3:40 AM      Failed - LDL in normal range and within 360 days    LDL Chol Calc (NIH)  Date Value Ref Range Status  08/28/2019 72 0 - 99 mg/dL Final         Passed - Total Cholesterol in normal range and within 360 days    Cholesterol, Total  Date Value Ref Range Status  08/28/2019 167 100 - 199 mg/dL Final         Passed - HDL in normal range and within 360 days    HDL  Date Value Ref Range Status  08/28/2019 77 >39 mg/dL Final         Passed - Triglycerides in normal range and within 360 days    Triglycerides  Date Value Ref Range Status  08/28/2019 102 0 - 149 mg/dL Final         Passed - Patient is not pregnant      Passed - Valid encounter within last 12 months    Recent Outpatient Visits          2 months ago Annual physical exam   [place], [place]   10 months ago Preoperative clearance   [place], Clearnce Sorrel, [place]   1 year ago Annual physical exam   [place], [place]   1 year ago Chronic bilateral low back pain with bilateral sciatica   [place], [place]   1 year ago Gastroesophageal reflux disease without esophagitis   [place], Clearnce Sorrel, [place]      Future Appointments            In 3 months Burnette, Clearnce Sorrel, PA-C [employer], [place]

## 2020-01-31 ENCOUNTER — Other Ambulatory Visit: Payer: Self-pay | Admitting: Physician Assistant

## 2020-01-31 DIAGNOSIS — E119 Type 2 diabetes mellitus without complications: Secondary | ICD-10-CM

## 2020-01-31 NOTE — Telephone Encounter (Signed)
Requested Prescriptions  Pending Prescriptions Disp Refills   glipiZIDE (GLUCOTROL) 5 MG tablet [Pharmacy Med Name: GLIPIZIDE 5MG  TABLETS] 45 tablet 0    Sig: TAKE 1/2 TABLET BY MOUTH EVERY DAY BEFORE SUPPER     Endocrinology:  Diabetes - Sulfonylureas Passed - 01/31/2020  8:24 AM      Passed - HBA1C is between 0 and 7.9 and within 180 days    Hgb A1c MFr Bld  Date Value Ref Range Status  08/28/2019 6.3 (H) 4.8 - 5.6 % Final    Comment:             Prediabetes: 5.7 - 6.4          Diabetes: >6.4          Glycemic control for adults with diabetes: <7.0          Passed - Valid encounter within last 6 months    Recent Outpatient Visits          5 months ago Annual physical exam   [facility] [place], Clearnce Sorrel, [place]   1 year ago Preoperative clearance   [place], Clearnce Sorrel, [place]   1 year ago Annual physical exam   [place], [place]   1 year ago Chronic bilateral low back pain with bilateral sciatica   [place], [place]   1 year ago Gastroesophageal reflux disease without esophagitis   [facility] [place], Clearnce Sorrel, PA-C      Future Appointments            In 3 weeks [name], Clearnce Sorrel, PA-C [facility]

## 2020-02-26 ENCOUNTER — Encounter: Payer: Self-pay | Admitting: Physician Assistant

## 2020-02-26 ENCOUNTER — Other Ambulatory Visit: Payer: Self-pay

## 2020-02-26 ENCOUNTER — Ambulatory Visit (INDEPENDENT_AMBULATORY_CARE_PROVIDER_SITE_OTHER): Payer: Medicare Other | Admitting: Physician Assistant

## 2020-02-26 VITALS — BP 132/67 | HR 68 | Temp 98.9°F | Wt 184.0 lb

## 2020-02-26 DIAGNOSIS — E1140 Type 2 diabetes mellitus with diabetic neuropathy, unspecified: Secondary | ICD-10-CM | POA: Diagnosis not present

## 2020-02-26 LAB — POCT GLYCOSYLATED HEMOGLOBIN (HGB A1C): Hemoglobin A1C: 6.5 % — AB (ref 4.0–5.6)

## 2020-02-26 MED ORDER — PREGABALIN 75 MG PO CAPS: 75.0000 mg | ORAL_CAPSULE | Freq: Two times a day (BID) | ORAL | 1 refills | Status: DC

## 2020-02-26 NOTE — Patient Instructions (Addendum)
Stopping duloxetine:  Start taking 1 tab every other day x 2 weeks, then stop  Stopping Gabapentin:  Decrease 400mg  to twice daily x 2 weeks, then 400mg  once daily x 2 weeks then stop.  Start Lyrica (pregabalin):  Start with one tab daily while on the gabapentin 400mg  twice daily. Then increase lyrica to twice daily once on gabapentin once daily.  Follow up lyrica dosing in 6 weeks

## 2020-02-26 NOTE — Progress Notes (Signed)
Established patient visit   Patient: Kathleen Tran   DOB: 02-Oct-1940   79 y.o. Female  MRN: [id number] Visit Date: 02/26/2020  Today's healthcare provider: Mar Daring, PA-C   Chief Complaint  Patient presents with  . Diabetes  . Hypertension   Subjective    HPI  Diabetes Mellitus Type II, Follow-up  Lab Results  Component Value Date   HGBA1C 6.5 (A) 02/26/2020   HGBA1C 6.3 (H) 08/28/2019   HGBA1C 6.7 (H) 08/12/2018   Wt Readings from Last 3 Encounters:  02/26/20 184 lb (83.5 kg)  10/16/19 186 lb (84.4 kg)  08/28/19 181 lb 9.6 oz (82.4 kg)   Last seen for diabetes 6 months ago.  Management since then includes Continue Glipizide 71m and metformin 10016mBID. Also on Pravastatin 8066mnd ASA 33m76mhe reports good compliance with treatment. She is not having side effects.  Symptoms: No fatigue No foot ulcerations  No appetite changes No nausea  No paresthesia of the feet  No polydipsia  No polyuria No visual disturbances   No vomiting     Home blood sugar records: fasting range: 140s  Episodes of hypoglycemia? No    Current insulin regiment: none Most Recent Eye Exam: every 6 months.  Current exercise: none Current diet habits: well balanced  Pertinent Labs: Lab Results  Component Value Date   CHOL 167 08/28/2019   HDL 77 08/28/2019   LDLCALC 72 08/28/2019   TRIG 102 08/28/2019   CHOLHDL 2.3 08/12/2018   Lab Results  Component Value Date   NA 147 (H) 08/28/2019   K 4.6 08/28/2019   CREATININE 0.63 08/28/2019   GFRNONAA 87 08/28/2019   GFRAA 101 08/28/2019   GLUCOSE 145 (H) 08/28/2019     diabetic polyneuropathy:6 months ago patient worsening on left foot. Management since that visit includes: already on Gabapentin 400mg67m. added duloxetine . May increase duloxetine as tolerated. Can also consider increasing gabapentin bedtime dose or changing gabapentin to Lyrica if not improving.   Hypertension, follow-up  BP Readings from Last 3  Encounters:  02/26/20 132/67  10/16/19 (!) 171/75  08/28/19 130/74   Wt Readings from Last 3 Encounters:  02/26/20 184 lb (83.5 kg)  10/16/19 186 lb (84.4 kg)  08/28/19 181 lb 9.6 oz (82.4 kg)     She was last seen for hypertension 6 months ago.  BP at that visit was 130/74. Management since that visit includes Stable. Continue Losartan 50mg.43me reports good compliance with treatment. She is not having side effects.  She is following a Regular diet. She is not exercising. She does not smoke.   Outside blood pressures are checked occasionally. Symptoms: No chest pain No chest pressure  No palpitations No syncope  No dyspnea No orthopnea  No paroxysmal nocturnal dyspnea No lower extremity edema   Pertinent labs: Lab Results  Component Value Date   CHOL 167 08/28/2019   HDL 77 08/28/2019   LDLCALC 72 08/28/2019   TRIG 102 08/28/2019   CHOLHDL 2.3 08/12/2018   Lab Results  Component Value Date   NA 147 (H) 08/28/2019   K 4.6 08/28/2019   CREATININE 0.63 08/28/2019   GFRNONAA 87 08/28/2019   GFRAA 101 08/28/2019   GLUCOSE 145 (H) 08/28/2019     The 10-year ASCVD risk score (Goff Mikey Bussing., et al., 2013) is: 46.8%   Patient Active Problem List   Diagnosis Date Noted  . Polyp of colon   . Polyp of ascending  colon   . Osteoarthritis of right hip 01/08/2019  . Onychomycosis of multiple toenails with type 2 diabetes mellitus and peripheral angiopathy ([place]) 08/12/2018  . GERD (gastroesophageal reflux disease) 02/20/2018  . Positive colorectal cancer screening using Cologuard test   . Benign neoplasm of cecum   . Benign neoplasm of ascending colon   . Benign neoplasm of descending colon   . External hemorrhoids   . Arthritis 04/12/2015  . Type 2 diabetes mellitus with vascular disease ([place]) 04/12/2015  . Osteoporosis 04/12/2015  . Hypercholesterolemia 04/12/2015  . Essential hypertension 04/12/2015   Past Medical History:  Diagnosis Date  . Arthritis   .  Diabetes mellitus without complication ([place])   . Dyspnea   . GERD (gastroesophageal reflux disease)   . Hepatitis    at age 69  . History of herniated intervertebral disc    lower back  . History of hiatal hernia   . Hyperlipidemia   . Hypertension   . Tremors of nervous system    HEAD       Medications: Outpatient Medications Prior to Visit  Medication Sig  . acetaminophen (TYLENOL) 500 MG tablet Take 1,000 mg by mouth daily as needed for moderate pain or headache.  Marland Kitchen aspirin 81 MG chewable tablet Chew 1 tablet (81 mg total) by mouth daily. (Not twice a day)  . Blood Glucose Monitoring Suppl (ONETOUCH VERIO IQ SYSTEM) w/Device KIT Check blood sugar a.c.h.s.  . Calcium Carb-Cholecalciferol (CALCIUM 1000 + D PO) Take 3 tablets by mouth daily before lunch.  . DULoxetine (CYMBALTA) 20 MG capsule TAKE 1 CAPSULE(20 MG) BY MOUTH DAILY  . gabapentin (NEURONTIN) 400 MG capsule TAKE 1 CAPSULE(400 MG) BY MOUTH THREE TIMES DAILY  . glipiZIDE (GLUCOTROL) 5 MG tablet TAKE 1/2 TABLET BY MOUTH EVERY DAY BEFORE SUPPER  . Lancets (ONETOUCH ULTRASOFT) lancets Use as instructed  . losartan (COZAAR) 50 MG tablet TAKE 1 TABLET(50 MG) BY MOUTH EVERY EVENING  . metFORMIN (GLUCOPHAGE) 1000 MG tablet TAKE 1 TABLET(1000 MG) BY MOUTH TWICE DAILY WITH A MEAL  . Multiple Vitamins-Minerals (ADULT GUMMY PO) Take 2 tablets by mouth daily before lunch.  . Omega-3 Fatty Acids (FISH OIL PO) Take 2 capsules by mouth daily before lunch.   [name] VERIO test strip TEST BLOOD SUGAR ONCE DAILY  . pravastatin (PRAVACHOL) 80 MG tablet TAKE 1 TABLET(80 MG) BY MOUTH EVERY EVENING  . vitamin B-12 (CYANOCOBALAMIN) 1000 MCG tablet Take 1,000 mcg by mouth daily before lunch.   . docusate sodium (COLACE) 100 MG capsule Take 1 capsule (100 mg total) by mouth 2 (two) times daily. (Patient not taking: Reported on 02/05/2019)   No facility-administered medications prior to visit.    Review of Systems  Constitutional: Negative.     Respiratory: Negative.   Cardiovascular: Negative.   Gastrointestinal: Negative.   Endocrine: Negative.   Musculoskeletal: Negative.   Neurological: Positive for numbness.  Psychiatric/Behavioral: Negative.     Last CBC Lab Results  Component Value Date   WBC 6.3 08/28/2019   HGB 12.9 08/28/2019   HCT 38.3 08/28/2019   MCV 94 08/28/2019   MCH 31.5 08/28/2019   RDW 13.0 08/28/2019   PLT 281 23/55/7322   Last metabolic panel Lab Results  Component Value Date   GLUCOSE 145 (H) 08/28/2019   NA 147 (H) 08/28/2019   K 4.6 08/28/2019   CL 107 (H) 08/28/2019   CO2 24 08/28/2019   BUN 12 08/28/2019   CREATININE 0.63 08/28/2019   GFRNONAA  87 08/28/2019   GFRAA 101 08/28/2019   CALCIUM 10.1 08/28/2019   PROT 6.4 08/28/2019   ALBUMIN 4.0 08/28/2019   LABGLOB 2.4 08/28/2019   AGRATIO 1.7 08/28/2019   BILITOT 0.4 08/28/2019   ALKPHOS 69 08/28/2019   AST 17 08/28/2019   ALT 15 08/28/2019   ANIONGAP 4 (L) 01/09/2019      Objective    BP 132/67   Pulse 68   Temp 98.9 F (37.2 C)   Wt 184 lb (83.5 kg)   BMI 31.58 kg/m  BP Readings from Last 3 Encounters:  02/26/20 132/67  10/16/19 (!) 171/75  08/28/19 130/74   Wt Readings from Last 3 Encounters:  02/26/20 184 lb (83.5 kg)  10/16/19 186 lb (84.4 kg)  08/28/19 181 lb 9.6 oz (82.4 kg)      Physical Exam Constitutional:      General: She is not in acute distress.    Appearance: Normal appearance. She is obese. She is not ill-appearing.  HENT:     Head: Normocephalic and atraumatic.  Cardiovascular:     Rate and Rhythm: Normal rate and regular rhythm.     Pulses: Normal pulses.     Heart sounds: Normal heart sounds.  Pulmonary:     Effort: Pulmonary effort is normal.     Breath sounds: Normal breath sounds.  Skin:    General: Skin is warm and dry.  Neurological:     General: No focal deficit present.     Mental Status: She is alert and oriented to person, place, and time. Mental status is at baseline.   Psychiatric:        Mood and Affect: Mood normal.        Behavior: Behavior normal.     Results for orders placed or performed in visit on 02/26/20  POCT glycosylated hemoglobin (Hb A1C)  Result Value Ref Range   Hemoglobin A1C 6.5 (A) 4.0 - 5.6 %   HbA1c POC (<> result, manual entry)     HbA1c, POC (prediabetic range)     HbA1c, POC (controlled diabetic range)      Assessment & Plan     1. Type 2 diabetes mellitus with diabetic neuropathy, without long-term current use of insulin (HCC) A1c increased slightly from 6.3 to 6.5. Still well controlled. Continue medications as prescribed. Is having worsening neuropathy. At last visit had added duloxetine 20m for pain. Was to continue gabapentin 4046mTID. Wants to change all therapy as she states it is not effective. Will titrate down on duloxetine and gabapentin. Will titrate up on Lyrica. Instructions for both were printed on AVS for patient. Will f/u in 4 weeks to see how she is doing with lyrica and titrate up if needed. Call if issues arise before follow up. - pregabalin (LYRICA) 75 MG capsule; Take 1 capsule (75 mg total) by mouth 2 (two) times daily.  Dispense: 60 capsule; Refill: 1   No follow-ups on file.      I,Reynolds BowlPA-C, have reviewed all documentation for this visit. The documentation on 02/27/20 for the exam, diagnosis, procedures, and orders are all accurate and complete.   JeRubye BeachBuRawlins County Health [phone]) [phone])  [place]

## 2020-03-05 ENCOUNTER — Other Ambulatory Visit: Payer: Self-pay | Admitting: Physician Assistant

## 2020-03-05 DIAGNOSIS — M1990 Unspecified osteoarthritis, unspecified site: Secondary | ICD-10-CM

## 2020-03-13 ENCOUNTER — Other Ambulatory Visit: Payer: Self-pay | Admitting: Physician Assistant

## 2020-03-13 DIAGNOSIS — I10 Essential (primary) hypertension: Secondary | ICD-10-CM

## 2020-03-13 DIAGNOSIS — E119 Type 2 diabetes mellitus without complications: Secondary | ICD-10-CM

## 2020-03-17 IMAGING — XA OPERATIVE LEFT HIP WITH PELVIS
2 series · 2 of 2 positions shown · non-contrast
Comparison: None.

CLINICAL DATA: Total left hip replacement

EXAM:
OPERATIVE left HIP (WITH PELVIS IF PERFORMED) 2 VIEWS fluoroscopic
time 19 seconds
TECHNIQUE: Fluoroscopic spot image(s) were submitted for interpretation
post-operatively.

[Series 6: cont. · 1 of 1 slices shown (1 of 2)]
[im 1/1]
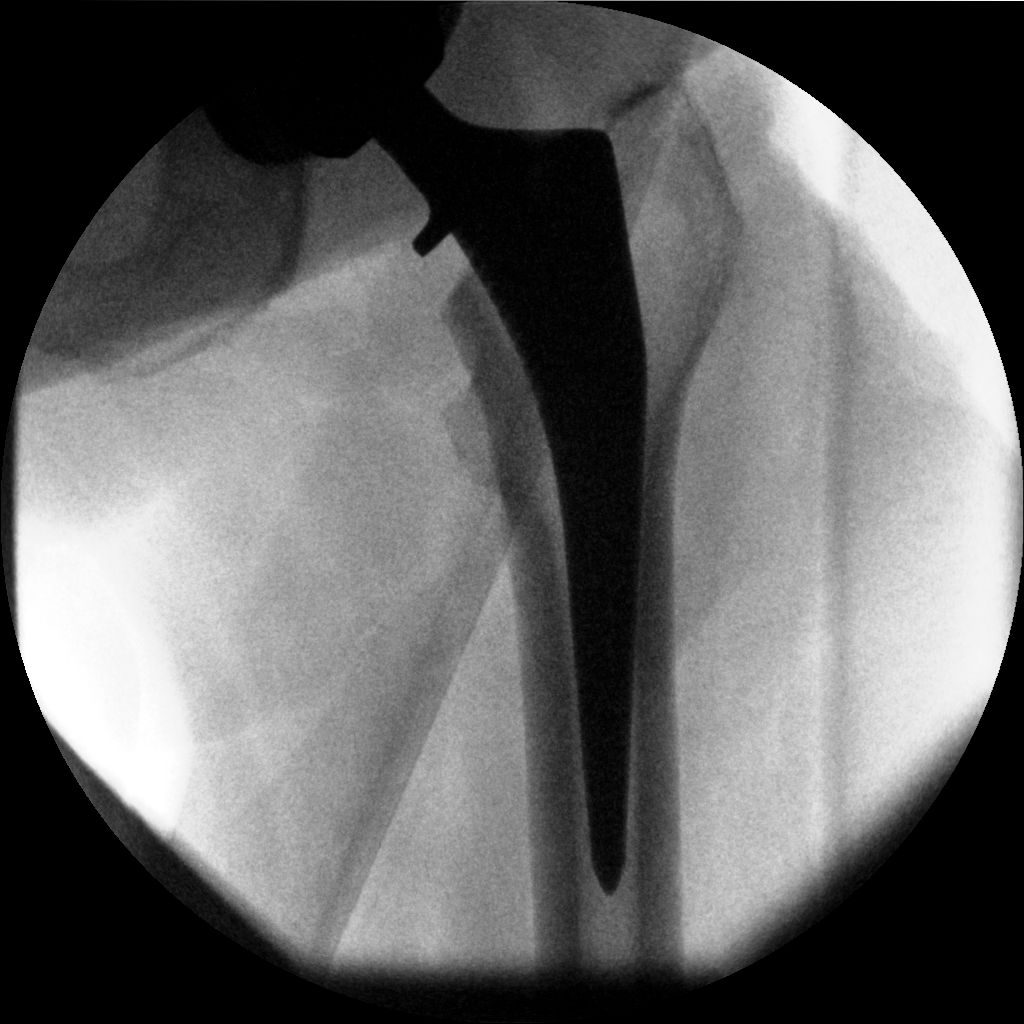

[Series 7: cont. · 1 of 1 slices shown (2 of 2)]
[im 1/1]
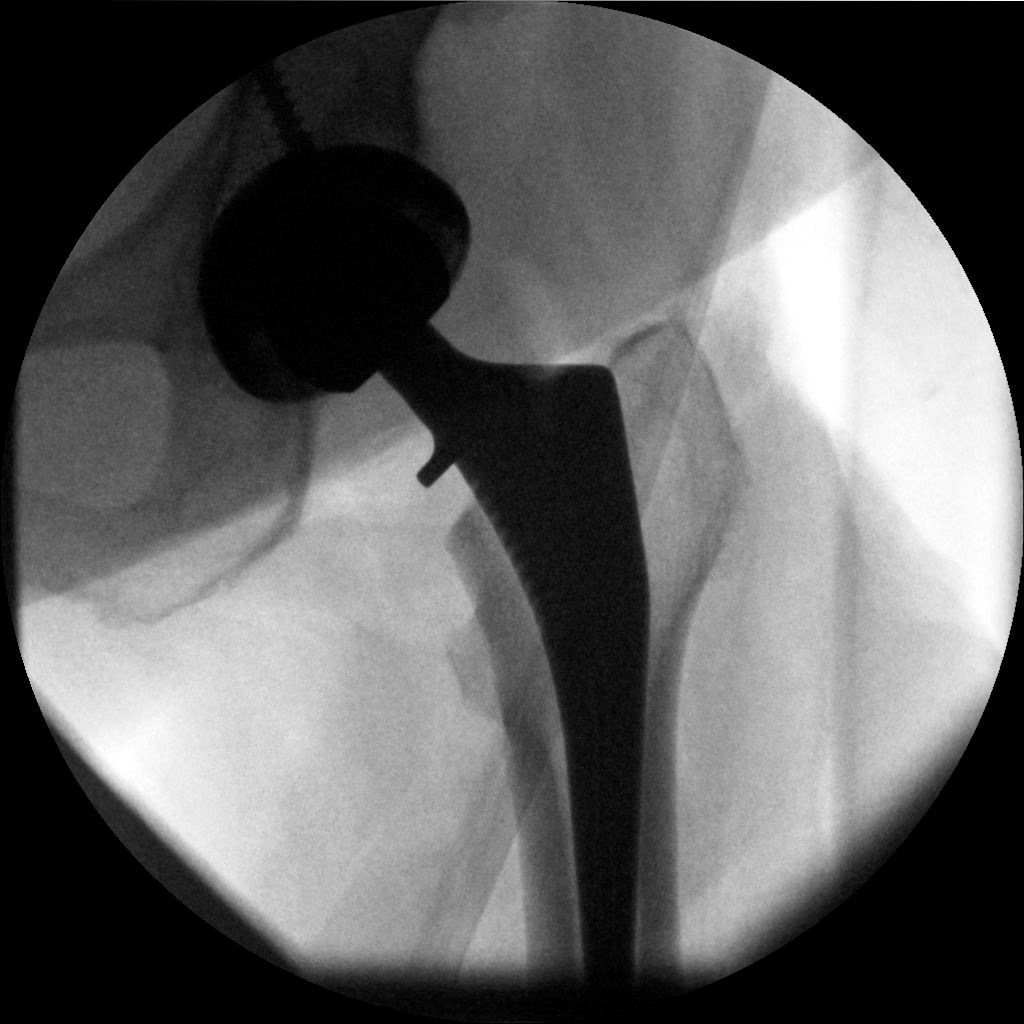

[2 of 2 positions shown; findings below may reference images not displayed]

FINDINGS: Two fluoroscopic images demonstrate total left hip replacement
without malalignment.
IMPRESSION: Left hip replacement without malalignment.

## 2020-03-17 IMAGING — DX PORTABLE PELVIS 1-2 VIEWS
1 series · 1 of 1 positions shown · non-contrast
Comparison: None.

CLINICAL DATA: Total left hip arthroplasty.

EXAM:
PORTABLE PELVIS 1-2 VIEWS

[pelvis ap]
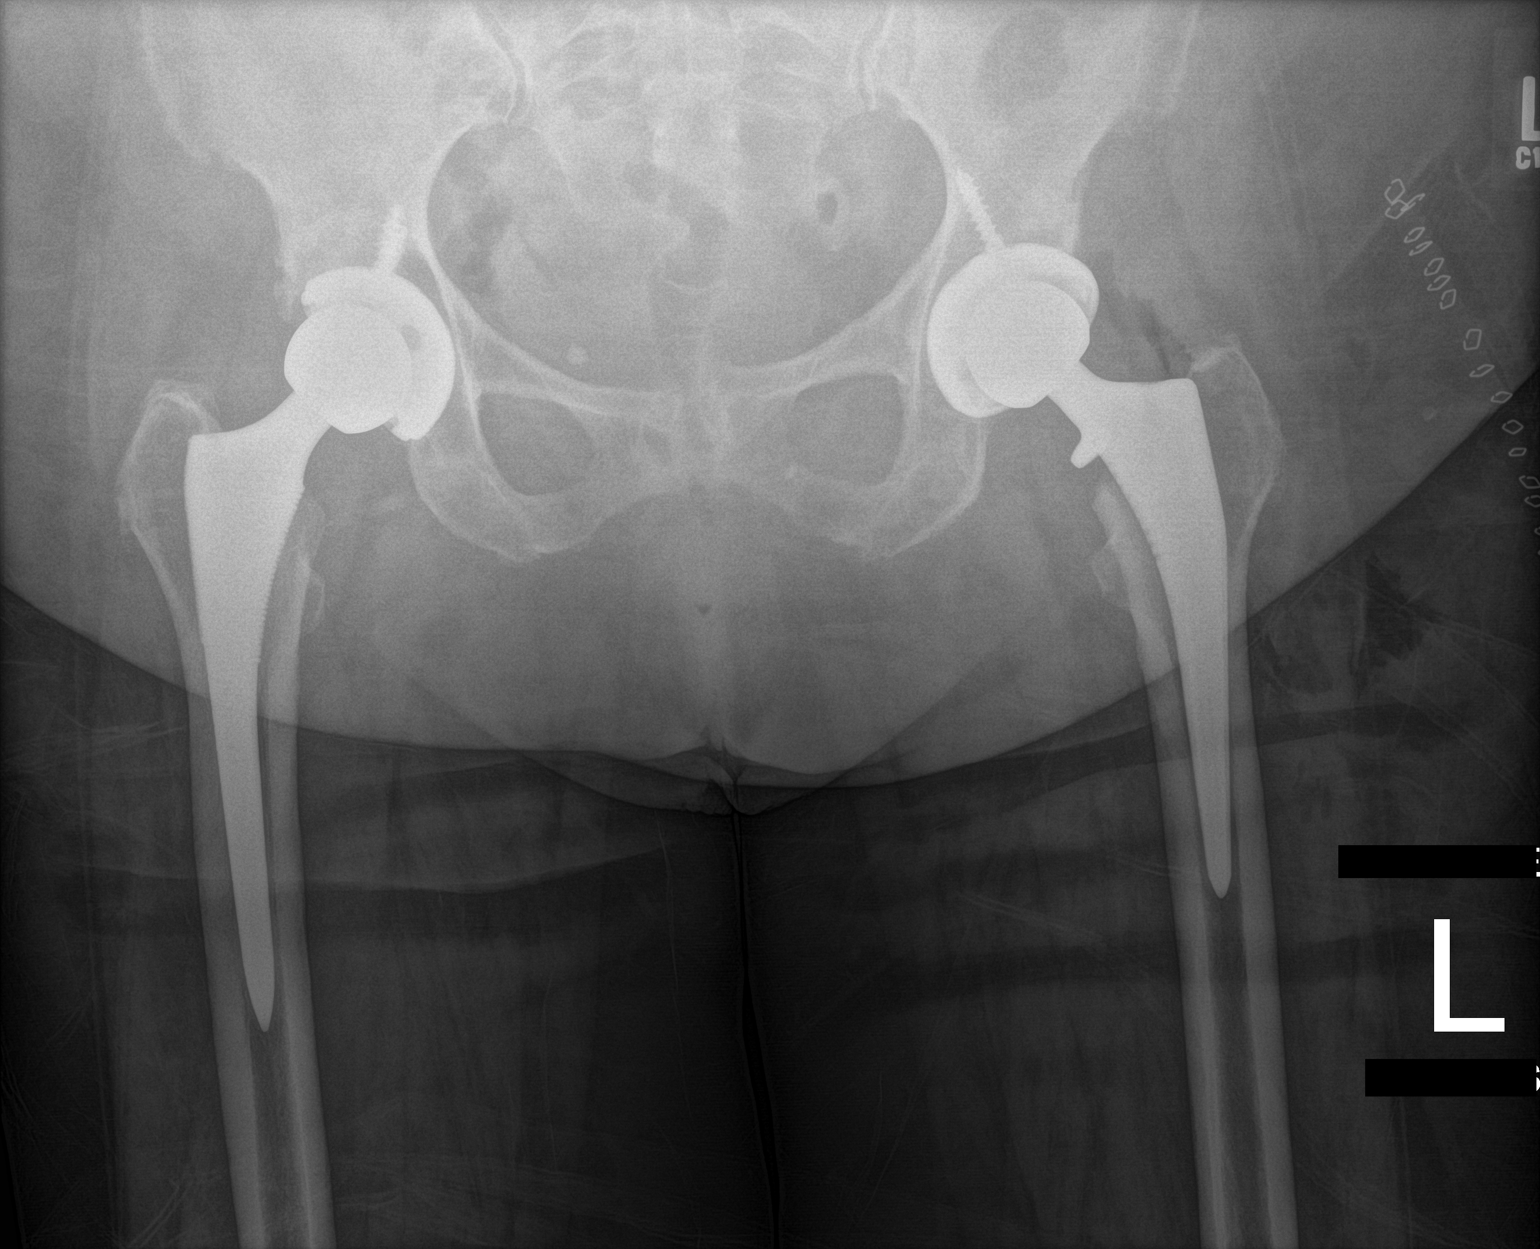

[1 of 1 positions shown; findings below may reference images not displayed]

FINDINGS: Well seated components of a total left hip arthroplasty. No
complicating features. Remote total right hip arthroplasty.
IMPRESSION: Well seated components of a total left hip arthroplasty without
complicating features.

## 2020-04-02 ENCOUNTER — Other Ambulatory Visit: Payer: Self-pay | Admitting: Physician Assistant

## 2020-04-02 DIAGNOSIS — E119 Type 2 diabetes mellitus without complications: Secondary | ICD-10-CM

## 2020-04-08 ENCOUNTER — Ambulatory Visit: Payer: Self-pay | Admitting: Physician Assistant

## 2020-04-09 ENCOUNTER — Other Ambulatory Visit: Payer: Self-pay

## 2020-04-09 ENCOUNTER — Encounter: Payer: Self-pay | Admitting: Physician Assistant

## 2020-04-09 ENCOUNTER — Ambulatory Visit
Admission: RE | Admit: 2020-04-09 | Discharge: 2020-04-09 | Disposition: A | Discharge status: Home / Self Care | Payer: Medicare Other | Attending: Physician Assistant | Admitting: Physician Assistant

## 2020-04-09 ENCOUNTER — Ambulatory Visit
Admission: RE | Admit: 2020-04-09 | Discharge: 2020-04-09 | Disposition: A | Discharge status: Home / Self Care | Payer: Medicare Other | Source: Ambulatory Visit | Attending: Physician Assistant | Admitting: Physician Assistant

## 2020-04-09 ENCOUNTER — Ambulatory Visit (INDEPENDENT_AMBULATORY_CARE_PROVIDER_SITE_OTHER): Payer: Medicare Other | Admitting: Physician Assistant

## 2020-04-09 VITALS — BP 119/78 | HR 63 | Temp 98.3°F | Wt 184.0 lb

## 2020-04-09 DIAGNOSIS — R0789 Other chest pain: Secondary | ICD-10-CM | POA: Diagnosis present

## 2020-04-09 DIAGNOSIS — E1140 Type 2 diabetes mellitus with diabetic neuropathy, unspecified: Secondary | ICD-10-CM

## 2020-04-09 MED ORDER — PREGABALIN 75 MG PO CAPS: 75.0000 mg | ORAL_CAPSULE | Freq: Two times a day (BID) | ORAL | 1 refills | Status: DC

## 2020-04-09 NOTE — Progress Notes (Signed)
Established patient visit   Patient: Kathleen Tran   DOB: 12/15/1940   79 y.o. Female  MRN: [id number] Visit Date: 04/09/2020  Today's healthcare provider: Mar Daring, PA-C   Chief Complaint  Patient presents with  . Peripheral Neuropathy   Subjective    HPI   Follow up for Neuropathy  The patient was last seen for this 1 months ago. Changes made at last visit include starting Lyrica.  She reports excellent compliance with treatment. She feels that condition is Improved. She is not having side effects.  But pt states she is more sleepy but does sleep better at night.  ----------------------------------------------------------------------------------------- Pt reports she has a "Lump" in the middle of her chest.  She states it has been there for several years.  She says it seems to be getting bigger.    Patient Active Problem List   Diagnosis Date Noted  . Polyp of colon   . Polyp of ascending colon   . Osteoarthritis of right hip 01/08/2019  . Onychomycosis of multiple toenails with type 2 diabetes mellitus and peripheral angiopathy ([place]) 08/12/2018  . GERD (gastroesophageal reflux disease) 02/20/2018  . Positive colorectal cancer screening using Cologuard test   . Benign neoplasm of cecum   . Benign neoplasm of ascending colon   . Benign neoplasm of descending colon   . External hemorrhoids   . Arthritis 04/12/2015  . Type 2 diabetes mellitus with vascular disease ([place]) 04/12/2015  . Osteoporosis 04/12/2015  . Hypercholesterolemia 04/12/2015  . Essential hypertension 04/12/2015   Past Medical History:  Diagnosis Date  . Arthritis   . Diabetes mellitus without complication ([place])   . Dyspnea   . GERD (gastroesophageal reflux disease)   . Hepatitis    at age 85  . History of herniated intervertebral disc    lower back  . History of hiatal hernia   . Hyperlipidemia   . Hypertension   . Tremors of nervous system    HEAD   Social History    Tobacco Use  . Smoking status: Never Smoker  . Smokeless tobacco: Never Used  Vaping Use  . Vaping Use: Never used  Substance Use Topics  . Alcohol use: No  . Drug use: No   Allergies  Allergen Reactions  . Citrus Other (See Comments)    Rash in mouth and on arms     Medications: Outpatient Medications Prior to Visit  Medication Sig  . acetaminophen (TYLENOL) 500 MG tablet Take 1,000 mg by mouth daily as needed for moderate pain or headache.  Marland Kitchen aspirin 81 MG chewable tablet Chew 1 tablet (81 mg total) by mouth daily. (Not twice a day)  . Blood Glucose Monitoring Suppl (ONETOUCH VERIO IQ SYSTEM) w/Device KIT Check blood sugar a.c.h.s.  . Calcium Carb-Cholecalciferol (CALCIUM 1000 + D PO) Take 3 tablets by mouth daily before lunch.  Marland Kitchen glipiZIDE (GLUCOTROL) 5 MG tablet TAKE 1/2 TABLET BY MOUTH EVERY DAY BEFORE SUPPER  . Lancets (ONETOUCH ULTRASOFT) lancets Use as instructed  . losartan (COZAAR) 50 MG tablet TAKE 1 TABLET(50 MG) BY MOUTH EVERY EVENING  . metFORMIN (GLUCOPHAGE) 1000 MG tablet TAKE 1 TABLET(1000 MG) BY MOUTH TWICE DAILY WITH A MEAL  . Multiple Vitamins-Minerals (ADULT GUMMY PO) Take 2 tablets by mouth daily before lunch.  . Omega-3 Fatty Acids (FISH OIL PO) Take 2 capsules by mouth daily before lunch.   [name] VERIO test strip TEST BLOOD SUGAR ONCE DAILY  . pravastatin (PRAVACHOL) 80 MG  tablet TAKE 1 TABLET(80 MG) BY MOUTH EVERY EVENING  . pregabalin (LYRICA) 75 MG capsule Take 1 capsule (75 mg total) by mouth 2 (two) times daily.  . vitamin B-12 (CYANOCOBALAMIN) 1000 MCG tablet Take 1,000 mcg by mouth daily before lunch.    No facility-administered medications prior to visit.    Review of Systems  Constitutional: Negative.   Respiratory: Negative.   Cardiovascular: Negative.   Gastrointestinal: Negative.   Neurological: Positive for light-headedness (On occasionally) and numbness. Negative for dizziness, weakness and headaches.      Objective    BP  119/78 (BP Location: Left Arm, Patient Position: Sitting, Cuff Size: Large)   Pulse 63   Temp 98.3 F (36.8 C) (Oral)   Wt 184 lb (83.5 kg)   BMI 31.58 kg/m    Physical Exam Vitals reviewed.  Constitutional:      General: She is not in acute distress.    Appearance: Normal appearance. She is well-developed. She is not ill-appearing or diaphoretic.  Cardiovascular:     Rate and Rhythm: Normal rate and regular rhythm.     Heart sounds: Normal heart sounds. No murmur heard.  No friction rub. No gallop.   Pulmonary:     Effort: Pulmonary effort is normal. No respiratory distress.     Breath sounds: Normal breath sounds. No wheezing or rales.  Chest:    Musculoskeletal:     Cervical back: Normal range of motion and neck supple.  Skin:    General: Skin is warm and dry.  Neurological:     Mental Status: She is alert.      No results found for any visits on 04/09/20.  Assessment & Plan     1. Tenderness of chest wall Will get imaging as below to r/o bony abnormality. I feel it is just a prominent xyphoid process but will get imaging to make sure no other bony abnormality.  - DG Sternum; Future  2. Type 2 diabetes mellitus with diabetic neuropathy, without long-term current use of insulin (HCC) Lyrica is helping. Continue as below. - pregabalin (LYRICA) 75 MG capsule; Take 1 capsule (75 mg total) by mouth 2 (two) times daily.  Dispense: 180 capsule; Refill: 1   No follow-ups on file.      Reynolds Bowl, PA-C, have reviewed all documentation for this visit. The documentation on 04/13/20 for the exam, diagnosis, procedures, and orders are all accurate and complete.   Rubye Beach  [facility] [phone] (phone) [phone] (fax)  [place]

## 2020-04-12 ENCOUNTER — Other Ambulatory Visit: Payer: Self-pay | Admitting: Physician Assistant

## 2020-04-12 DIAGNOSIS — E119 Type 2 diabetes mellitus without complications: Secondary | ICD-10-CM

## 2020-04-12 DIAGNOSIS — I10 Essential (primary) hypertension: Secondary | ICD-10-CM

## 2020-04-12 NOTE — Telephone Encounter (Signed)
Requested Prescriptions  Pending Prescriptions Disp Refills  . losartan (COZAAR) 50 MG tablet [Pharmacy Med Name: LOSARTAN 50MG TABLETS] 90 tablet     Sig: TAKE 1 TABLET(50 MG) BY MOUTH EVERY EVENING     Cardiovascular:  Angiotensin Receptor Blockers Failed - 04/12/2020  3:38 AM      Failed - Cr in normal range and within 180 days    Creatinine, Ser  Date Value Ref Range Status  08/28/2019 0.63 0.57 - 1.00 mg/dL Final         Failed - K in normal range and within 180 days    Potassium  Date Value Ref Range Status  08/28/2019 4.6 3.5 - 5.2 mmol/L Final         Passed - Patient is not pregnant      Passed - Last BP in normal range    BP Readings from Last 1 Encounters:  04/09/20 119/78         Passed - Valid encounter within last 6 months    Recent Outpatient Visits          3 days ago Tenderness of chest wall   [facility] [place], Clearnce Sorrel, PA-C   1 month ago Type 2 diabetes mellitus with diabetic neuropathy, without long-term current use of insulin [facility])   [facility] [place], [place], [place]   7 months ago Annual physical exam   [place], [place], [place]   1 year ago Preoperative clearance   [place], Anderson Malta M, [place]   1 year ago Annual physical exam   [place], Clearnce Sorrel, [place]      Future Appointments            In 4 months [place], Clearnce Sorrel, PA-C [facility]           . metFORMIN (GLUCOPHAGE) 1000 MG tablet [employer] Med Name: METFORMIN 1000MG TABLETS] 180 tablet     Sig: TAKE 1 TABLET(1000 MG) BY MOUTH TWICE DAILY WITH A MEAL     Endocrinology:  Diabetes - Biguanides Passed - 04/12/2020  3:38 AM      Passed - Cr in normal range and within 360 days    Creatinine, Ser  Date Value Ref Range Status  08/28/2019 0.63 0.57 - 1.00 mg/dL Final         Passed - HBA1C is between 0 and 7.9 and within 180 days    Hemoglobin  A1C  Date Value Ref Range Status  02/26/2020 6.5 (A) 4.0 - 5.6 % Final   Hgb A1c MFr Bld  Date Value Ref Range Status  08/28/2019 6.3 (H) 4.8 - 5.6 % Final    Comment:             Prediabetes: 5.7 - 6.4          Diabetes: >6.4          Glycemic control for adults with diabetes: <7.0          Passed - eGFR in normal range and within 360 days    GFR calc Af Amer  Date Value Ref Range Status  08/28/2019 101 >59 mL/min/1.73 Final   GFR calc non Af Amer  Date Value Ref Range Status  08/28/2019 87 >59 mL/min/1.73 Final         Passed - Valid encounter within last 6 months    Recent Outpatient Visits          3 days  ago Tenderness of chest wall   [place], [place], [place]   1 month ago Type 2 diabetes mellitus with diabetic neuropathy, without long-term current use of insulin [facility])   [place], [place], [place]   7 months ago Annual physical exam   [place], Clearnce Sorrel, [place]   1 year ago Preoperative clearance   [place], Clearnce Sorrel, [place]   1 year ago Annual physical exam   [place], Clearnce Sorrel, [place]      Future Appointments            In 4 months [place], Clearnce Sorrel, PA-C [employer], [place]

## 2020-04-19 ENCOUNTER — Other Ambulatory Visit: Payer: Self-pay | Admitting: Physician Assistant

## 2020-04-19 DIAGNOSIS — E1142 Type 2 diabetes mellitus with diabetic polyneuropathy: Secondary | ICD-10-CM

## 2020-05-01 ENCOUNTER — Other Ambulatory Visit: Payer: Self-pay | Admitting: Physician Assistant

## 2020-05-01 DIAGNOSIS — E119 Type 2 diabetes mellitus without complications: Secondary | ICD-10-CM

## 2020-07-13 ENCOUNTER — Telehealth: Payer: Self-pay | Admitting: Physician Assistant

## 2020-07-13 NOTE — Telephone Encounter (Signed)
Pt starting the lyrica in oct and in nov 2021 she has been having right arm pain. The lyrica was prescribed for her foot pain. The pt foot feeling funny. Pt does not want to make an appt. Pt has an appt in feb 2022. Pt is aware jenni not in office today. Pt does not want to talk with triage nurse

## 2020-07-14 NOTE — Telephone Encounter (Signed)
Pt states that she wants return call she will be available until 11:00 and after 3:00 today [phone]

## 2020-07-14 NOTE — Telephone Encounter (Signed)
Pt advised.   Thanks,   -Rahn Lacuesta  

## 2020-07-14 NOTE — Telephone Encounter (Signed)
Have her decrease lyrica to once daily for now and call back and let me know how symptoms are doing in a week or 2

## 2020-07-14 NOTE — Telephone Encounter (Signed)
FYI- I called patient No answer/LM. Please review

## 2020-07-31 ENCOUNTER — Other Ambulatory Visit: Payer: Self-pay | Admitting: Physician Assistant

## 2020-07-31 DIAGNOSIS — E119 Type 2 diabetes mellitus without complications: Secondary | ICD-10-CM

## 2020-07-31 NOTE — Telephone Encounter (Signed)
Requested Prescriptions  Pending Prescriptions Disp Refills  . glipiZIDE (GLUCOTROL) 5 MG tablet [Pharmacy Med Name: GLIPIZIDE 5MG  TABLETS] 45 tablet 0    Sig: TAKE 1/2 TABLET BY MOUTH EVERY DAY BEFORE SUPPER     Endocrinology:  Diabetes - Sulfonylureas Passed - 07/31/2020  3:36 AM      Passed - HBA1C is between 0 and 7.9 and within 180 days    Hemoglobin A1C  Date Value Ref Range Status  02/26/2020 6.5 (A) 4.0 - 5.6 % Final   Hgb A1c MFr Bld  Date Value Ref Range Status  08/28/2019 6.3 (H) 4.8 - 5.6 % Final    Comment:             Prediabetes: 5.7 - 6.4          Diabetes: >6.4          Glycemic control for adults with diabetes: <7.0          Passed - Valid encounter within last 6 months    Recent Outpatient Visits          3 months ago Tenderness of chest wall   [place], Anderson Malta M, PA-C   5 months ago Type 2 diabetes mellitus with diabetic neuropathy, without long-term current use of insulin [facility])   [facility] [place], [place], [place]   11 months ago Annual physical exam   [place], Clearnce Sorrel, [place]   1 year ago Preoperative clearance   [place], Clearnce Sorrel, [place]   1 year ago Annual physical exam   [place], Clearnce Sorrel, [place]      Future Appointments            In 1 month [place], Clearnce Sorrel, PA-C [employer]

## 2020-08-03 ENCOUNTER — Other Ambulatory Visit: Payer: Self-pay | Admitting: Physician Assistant

## 2020-08-03 DIAGNOSIS — E1159 Type 2 diabetes mellitus with other circulatory complications: Secondary | ICD-10-CM

## 2020-08-03 MED ORDER — ONETOUCH DELICA LANCETS 33G MISC: 4 refills | Status: DC

## 2020-08-03 NOTE — Progress Notes (Signed)
Refilled lancets

## 2020-08-26 NOTE — Progress Notes (Deleted)
Subjective:   Kathleen Tran is a 80 y.o. female who presents for Medicare Annual (Subsequent) preventive examination.  Review of Systems    N/A        Objective:    There were no vitals filed for this visit. There is no height or weight on file to calculate BMI.  Advanced Directives 10/16/2019 08/14/2019 02/11/2019 02/11/2019 01/08/2019 01/01/2019 08/12/2018  Does Patient Have a Medical Advance Directive? Yes Yes Yes Yes No Yes Yes  Type of Advance Directive Out of facility DNR (pink MOST or yellow form) [place];Living will - Living will - [place];Living will [place];Living will  Does patient want to make changes to medical advance directive? - - - No - Patient declined - - -  Copy of [place] in Chart? - No - copy requested - - - - No - copy requested  Would patient like information on creating a medical advance directive? - - - - No - Patient declined - -    Current Medications (verified) Outpatient Encounter Medications as of 08/30/2020  Medication Sig  . acetaminophen (TYLENOL) 500 MG tablet Take 1,000 mg by mouth daily as needed for moderate pain or headache.  Marland Kitchen aspirin 81 MG chewable tablet Chew 1 tablet (81 mg total) by mouth daily. (Not twice a day)  . Blood Glucose Monitoring Suppl (ONETOUCH VERIO IQ SYSTEM) w/Device KIT Check blood sugar a.c.h.s.  . Calcium Carb-Cholecalciferol (CALCIUM 1000 + D PO) Take 3 tablets by mouth daily before lunch.  Marland Kitchen glipiZIDE (GLUCOTROL) 5 MG tablet TAKE 1/2 TABLET BY MOUTH EVERY DAY BEFORE SUPPER  . losartan (COZAAR) 50 MG tablet TAKE 1 TABLET(50 MG) BY MOUTH EVERY EVENING  . metFORMIN (GLUCOPHAGE) 1000 MG tablet TAKE 1 TABLET(1000 MG) BY MOUTH TWICE DAILY WITH A MEAL  . Multiple Vitamins-Minerals (ADULT GUMMY PO) Take 2 tablets by mouth daily before lunch.  . Omega-3 Fatty Acids (FISH OIL PO) Take 2 capsules by mouth daily before lunch.   [name] Delica Lancets  78E MISC To check blood sugar once daily  . ONETOUCH VERIO test strip TEST BLOOD SUGAR ONCE DAILY  . pravastatin (PRAVACHOL) 80 MG tablet TAKE 1 TABLET(80 MG) BY MOUTH EVERY EVENING  . pregabalin (LYRICA) 75 MG capsule Take 1 capsule (75 mg total) by mouth 2 (two) times daily.  . vitamin B-12 (CYANOCOBALAMIN) 1000 MCG tablet Take 1,000 mcg by mouth daily before lunch.    No facility-administered encounter medications on file as of 08/30/2020.    Allergies (verified) Citrus   History: Past Medical History:  Diagnosis Date  . Arthritis   . Diabetes mellitus without complication ([place])   . Dyspnea   . GERD (gastroesophageal reflux disease)   . Hepatitis    at age 5  . History of herniated intervertebral disc    lower back  . History of hiatal hernia   . Hyperlipidemia   . Hypertension   . Tremors of nervous system    HEAD   Past Surgical History:  Procedure Laterality Date  . APPENDECTOMY    . CATARACT EXTRACTION W/PHACO Left 09/11/2018   Procedure: CATARACT EXTRACTION PHACO AND INTRAOCULAR LENS PLACEMENT (IOC) LEFT, DIABETIC;  Surgeon: Leandrew Koyanagi, MD;  Location: [facility] ORS;  Service: Ophthalmology;  Laterality: Left;  [place]  01:03 AP% 7.9 CDE 6.89 Fluid pack lot # [id number]  . CATARACT EXTRACTION W/PHACO Right 02/11/2019   Procedure: CATARACT EXTRACTION PHACO AND INTRAOCULAR LENS PLACEMENT (IOC) RIGHT,  DIABETIC;  Surgeon: Leandrew Koyanagi, MD;  Location: [facility] ORS;  Service: Ophthalmology;  Laterality: Right;  [place] 00:51 AP% 11.6 CDE 8.20 fluid pack lot # [id number]  . COLONOSCOPY WITH PROPOFOL N/A 09/26/2017   Procedure: COLONOSCOPY WITH PROPOFOL;  Surgeon: Virgel Manifold, MD;  Location: [facility] ENDOSCOPY;  Service: Endoscopy;  Laterality: N/A;  . COLONOSCOPY WITH PROPOFOL N/A 10/16/2019   Procedure: COLONOSCOPY WITH PROPOFOL;  Surgeon: Lucilla Lame, MD;  Location: [facility];  Service: Endoscopy;  Laterality: N/A;  . CYST EXCISION     "base of spine"  . EYE  SURGERY Left    cataract extraction  . JOINT REPLACEMENT Left    hip replacement  . PARTIAL HIP ARTHROPLASTY Right   . TONSILLECTOMY    . TOTAL HIP ARTHROPLASTY Left 01/08/2019   Procedure: TOTAL HIP ARTHROPLASTY ANTERIOR APPROACH;  Surgeon: Lovell Sheehan, MD;  Location: [facility] ORS;  Service: Orthopedics;  Laterality: Left;   No family history on file. Social History   Socioeconomic History  . Marital status: Married    Spouse name: Kathleen Tran ...son  . Number of children: 6  . Years of education: Not on file  . Highest education level: GED or equivalent  Occupational History  . Occupation: retired  Tobacco Use  . Smoking status: Never Smoker  . Smokeless tobacco: Never Used  Vaping Use  . Vaping Use: Never used  Substance and Sexual Activity  . Alcohol use: No  . Drug use: No  . Sexual activity: Not on file  Other Topics Concern  . Not on file  Social History Narrative  . Not on file   Social Determinants of Health   Financial Resource Strain: Not on file  Food Insecurity: Not on file  Transportation Needs: Not on file  Physical Activity: Not on file  Stress: Not on file  Social Connections: Not on file    Tobacco Counseling Counseling given: Not Answered   Clinical Intake:                 Diabetic? Yes  Nutrition Risk Assessment:  Has the patient had any N/V/D within the last 2 months?  No  Does the patient have any non-healing wounds?  No  Has the patient had any unintentional weight loss or weight gain?  No   Diabetes:  Is the patient diabetic?  Yes  If diabetic, was a CBG obtained today?  No  Did the patient bring in their glucometer from home?  No  How often do you monitor your CBG's? ***.   Financial Strains and Diabetes Management:  Are you having any financial strains with the device, your supplies or your medication? No .  Does the patient want to be seen by Chronic Care Management for management of their diabetes?  No  Would the  patient like to be referred to a Nutritionist or for Diabetic Management?  No   Diabetic Exams:  Diabetic Eye Exam: Completed 10/27/19 Diabetic Foot Exam: Overdue, Pt has been advised about the importance in completing this exam. Pt is scheduled for diabetic foot exam on ***.          Activities of Daily Living In your present state of health, do you have any difficulty performing the following activities: 04/13/2020  Hearing? N  Vision? N  Difficulty concentrating or making decisions? N  Walking or climbing stairs? N  Dressing or bathing? N  Doing errands, shopping? N  Some recent data might be hidden    Patient Care  Team: Rubye Beach as PCP - General (Physician Assistant) Lovell Sheehan, MD as Consulting Physician (Orthopedic Surgery) [facility], [facility])  Indicate any recent Medical Services you may have received from other than [facility] providers in the past year (date may be approximate).     Assessment:   This is a routine wellness examination for Analisse.  Hearing/Vision screen No exam data present  Dietary issues and exercise activities discussed:    Goals    . DIET - REDUCE SUGAR INTAKE     Recommend cutting out sweets in daily diet. Pt to avoid eating desserts to help aid in weight loss and help diabetes.   08/12/18: Continue watching sugar intake and try switching to sugar free yogurts to substitute for sweets.       Depression Screen PHQ 2/9 Scores 04/13/2020 08/14/2019 08/12/2018 08/12/2018 07/13/2017 07/13/2016  PHQ - 2 Score 0 0 0 0 0 0  PHQ- 9 Score 2 - 1 - - -    Fall Risk Fall Risk  04/13/2020 08/14/2019 08/12/2018 07/13/2017 07/13/2016  Falls in the past year? 0 0 0 No No  Number falls in past yr: 0 0 - - -  Injury with Fall? 0 0 - - -  Risk for fall due to : No Fall Risks - - - -  Follow up Falls evaluation completed - - - -    FALL RISK PREVENTION PERTAINING TO THE HOME:  Any stairs in or around the home?  {YES/NO:21197} If so, are there any without handrails? {YES/NO:21197} Home free of loose throw rugs in walkways, pet beds, electrical cords, etc? Yes  Adequate lighting in your home to reduce risk of falls? Yes   ASSISTIVE DEVICES UTILIZED TO PREVENT FALLS:  Life alert? {YES/NO:21197} Use of a cane, walker or w/c? {YES/NO:21197} Grab bars in the bathroom? {YES/NO:21197} Shower chair or bench in shower? {YES/NO:21197} Elevated toilet seat or a handicapped toilet? {YES/NO:21197}  TIMED UP AND GO:  Was the test performed? Yes .  Length of time to ambulate 10 feet: *** sec.   {Appearance of ASTM:1962229}  Cognitive Function:        Immunizations Immunization History  Administered Date(s) Administered  . Moderna Sars-Covid-2 Vaccination 08/15/2019, 09/12/2019, 05/23/2020  . Pneumococcal Conjugate-13 05/19/2013  . Pneumococcal Polysaccharide-23 01/05/2006, 05/03/2009  . Td 08/19/2009  . Tdap 08/19/2009  . Zoster 08/19/2009    TDAP status: Due, Education has been provided regarding the importance of this vaccine. Advised may receive this vaccine at local pharmacy or Health Dept. Aware to provide a copy of the vaccination record if obtained from local pharmacy or Health Dept. Verbalized acceptance and understanding.  {Flu Vaccine status:2101806}  Pneumococcal vaccine status: Up to date  Covid-19 vaccine status: Completed vaccines  Qualifies for Shingles Vaccine? Yes   Zostavax completed Yes   Shingrix Completed?: No.    Education has been provided regarding the importance of this vaccine. Patient has been advised to call insurance company to determine out of pocket expense if they have not yet received this vaccine. Advised may also receive vaccine at local pharmacy or Health Dept. Verbalized acceptance and understanding.  Screening Tests Health Maintenance  Topic Date Due  . INFLUENZA VACCINE  09/30/2020 (Originally 02/01/2020)  . TETANUS/TDAP  08/27/2029 (Originally  08/20/2019)  . FOOT EXAM  08/27/2020  . HEMOGLOBIN A1C  08/28/2020  . OPHTHALMOLOGY EXAM  10/26/2020  . COLONOSCOPY (Pts 45-78yr Insurance coverage will need to be confirmed)  10/15/2021  . DEXA  SCAN  11/20/2023  . COVID-19 Vaccine  Completed  . PNA vac Low Risk Adult  Completed    Health Maintenance  There are no preventive care reminders to display for this patient.  Colorectal cancer screening: Type of screening: Colonoscopy. Completed 10/16/19. Repeat every 2 years  Mammogram status: No longer required due to age.  Bone Density status: Completed 11/20/18. Results reflect: Bone density results: OSTEOPENIA. Repeat every 5 years.  Lung Cancer Screening: (Low Dose CT Chest recommended if Age 32-80 years, 30 pack-year currently smoking OR have quit w/in 15years.) does not qualify.   Additional Screening:  Vision Screening: Recommended annual ophthalmology exams for early detection of glaucoma and other disorders of the eye. Is the patient up to date with their annual eye exam?  Yes  Who is the provider or what is the name of the office in which the patient attends annual eye exams? *** If pt is not established with a provider, would they like to be referred to a provider to establish care? No .   Dental Screening: Recommended annual dental exams for proper oral hygiene  Community Resource Referral / Chronic Care Management: CRR required this visit?  No   CCM required this visit?  No      Plan:     I have personally reviewed and noted the following in the patient's chart:   . Medical and social history . Use of alcohol, tobacco or illicit drugs  . Current medications and supplements . Functional ability and status . Nutritional status . Physical activity . Advanced directives . List of other physicians . Hospitalizations, surgeries, and ER visits in previous 12 months . Vitals . Screenings to include cognitive, depression, and falls . Referrals and appointments  In  addition, I have reviewed and discussed with patient certain preventive protocols, quality metrics, and best practice recommendations. A written personalized care plan for preventive services as well as general preventive health recommendations were provided to patient.     Leasa Kincannon [place], [place]   [id number]   Nurse Notes: ***

## 2020-08-27 ENCOUNTER — Telehealth: Payer: Self-pay

## 2020-08-27 NOTE — Telephone Encounter (Signed)
Copied from [place] [phone]. Topic: Appointment Scheduling - Scheduling Inquiry for Clinic >> Aug 27, 2020 10:59 AM Yvette Rack wrote: Reason for CRM: Pt requests that her Medicare AWV appt be cancelled. Pt stated her son tested positive for covid

## 2020-08-27 NOTE — Telephone Encounter (Signed)
Apt canceled.   Thanks,   -Sreeja Spies  

## 2020-08-30 ENCOUNTER — Encounter: Payer: Medicare Other | Admitting: Physician Assistant

## 2020-09-02 NOTE — Progress Notes (Signed)
Subjective:   Kathleen Tran is a 80 y.o. female who presents for Medicare Annual (Subsequent) preventive examination.  I connected with Kathleen Tran today by telephone and verified that I am speaking with the correct person using two identifiers. Location patient: home Location provider: work Persons participating in the virtual visit: patient, provider.   I discussed the limitations, risks, security and privacy concerns of performing an evaluation and management service by telephone and the availability of in person appointments. I also discussed with the patient that there may be a patient responsible charge related to this service. The patient expressed understanding and verbally consented to this telephonic visit.    Interactive audio and video telecommunications were attempted between this provider and patient, however failed, due to patient having technical difficulties OR patient did not have access to video capability.  We continued and completed visit with audio only.   Review of Systems    N/A  Cardiac Risk Factors include: advanced age (>26men, >65 women);diabetes mellitus;dyslipidemia;hypertension;obesity (BMI >30kg/m2)     Objective:    Today's Vitals   09/06/20 1100  PainSc: 2    There is no height or weight on file to calculate BMI.  Advanced Directives 09/06/2020 10/16/2019 08/14/2019 02/11/2019 02/11/2019 01/08/2019 01/01/2019  Does Patient Have a Medical Advance Directive? Yes Yes Yes Yes Yes No Yes  Type of Paramedic of [place];Living will Out of facility DNR (pink MOST or yellow form) [place];Living will - Living will - [place];Living will  Does patient want to make changes to medical advance directive? - - - - No - Patient declined - -  Copy of [place] in Chart? Yes - validated most recent copy scanned in chart (See row information) - No - copy requested - - - -  Would patient like  information on creating a medical advance directive? - - - - - No - Patient declined -    Current Medications (verified) Outpatient Encounter Medications as of 09/06/2020  Medication Sig  . acetaminophen (TYLENOL) 500 MG tablet Take 1,000 mg by mouth daily as needed for moderate pain or headache.  Marland Kitchen aspirin 81 MG chewable tablet Chew 1 tablet (81 mg total) by mouth daily. (Not twice a day)  . Blood Glucose Monitoring Suppl (ONETOUCH VERIO IQ SYSTEM) w/Device KIT Check blood sugar a.c.h.s.  . Calcium Carb-Cholecalciferol (CALCIUM 1000 + D PO) Take 3 tablets by mouth daily before lunch.  Marland Kitchen glipiZIDE (GLUCOTROL) 5 MG tablet TAKE 1/2 TABLET BY MOUTH EVERY DAY BEFORE SUPPER  . losartan (COZAAR) 50 MG tablet TAKE 1 TABLET(50 MG) BY MOUTH EVERY EVENING  . metFORMIN (GLUCOPHAGE) 1000 MG tablet TAKE 1 TABLET(1000 MG) BY MOUTH TWICE DAILY WITH A MEAL  . Multiple Vitamins-Minerals (ADULT GUMMY PO) Take 2 tablets by mouth daily before lunch.  . Omega-3 Fatty Acids (FISH OIL PO) Take 2 capsules by mouth daily before lunch.   [name] Delica Lancets 09F MISC To check blood sugar once daily  . ONETOUCH VERIO test strip TEST BLOOD SUGAR ONCE DAILY  . pravastatin (PRAVACHOL) 80 MG tablet TAKE 1 TABLET(80 MG) BY MOUTH EVERY EVENING  . pregabalin (LYRICA) 75 MG capsule Take 1 capsule (75 mg total) by mouth 2 (two) times daily.  . vitamin B-12 (CYANOCOBALAMIN) 1000 MCG tablet Take 1,000 mcg by mouth daily before lunch.    No facility-administered encounter medications on file as of 09/06/2020.    Allergies (verified) [place]   History:  Past Medical History:  Diagnosis Date  . Arthritis   . Diabetes mellitus without complication ([place])   . Dyspnea   . GERD (gastroesophageal reflux disease)   . Hepatitis    at age 60  . History of herniated intervertebral disc    lower back  . History of hiatal hernia   . Hyperlipidemia   . Hypertension   . Tremors of nervous system    HEAD   Past Surgical History:   Procedure Laterality Date  . APPENDECTOMY    . CATARACT EXTRACTION W/PHACO Left 09/11/2018   Procedure: CATARACT EXTRACTION PHACO AND INTRAOCULAR LENS PLACEMENT (IOC) LEFT, DIABETIC;  Surgeon: Leandrew Koyanagi, MD;  Location: [facility] ORS;  Service: Ophthalmology;  Laterality: Left;  [place]  01:03 AP% 7.9 CDE 6.89 Fluid pack lot # [id number]  . CATARACT EXTRACTION W/PHACO Right 02/11/2019   Procedure: CATARACT EXTRACTION PHACO AND INTRAOCULAR LENS PLACEMENT (IOC) RIGHT, DIABETIC;  Surgeon: Leandrew Koyanagi, MD;  Location: [facility] ORS;  Service: Ophthalmology;  Laterality: Right;  [place] 00:51 AP% 11.6 CDE 8.20 fluid pack lot # [id number]  . COLONOSCOPY WITH PROPOFOL N/A 09/26/2017   Procedure: COLONOSCOPY WITH PROPOFOL;  Surgeon: Virgel Manifold, MD;  Location: [facility] ENDOSCOPY;  Service: Endoscopy;  Laterality: N/A;  . COLONOSCOPY WITH PROPOFOL N/A 10/16/2019   Procedure: COLONOSCOPY WITH PROPOFOL;  Surgeon: Lucilla Lame, MD;  Location: [facility] ENDOSCOPY;  Service: Endoscopy;  Laterality: N/A;  . CYST EXCISION     "base of spine"  . EYE SURGERY Left    cataract extraction  . JOINT REPLACEMENT Left    hip replacement  . PARTIAL HIP ARTHROPLASTY Right   . TONSILLECTOMY    . 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 No pertinent family history. Social History   Socioeconomic History  . Marital status: Married    Spouse name: Kathleen Tran ...son  . Number of children: 6  . Years of education: Not on file  . Highest education level: GED or equivalent  Occupational History  . Occupation: retired  Tobacco Use  . Smoking status: Never Smoker  . Smokeless tobacco: Never Used  Vaping Use  . Vaping Use: Never used  Substance and Sexual Activity  . Alcohol use: No  . Drug use: No  . Sexual activity: Not on file  Other Topics Concern  .  Not on file  Social History Narrative  . Not on file   Social Determinants of Health   Financial Resource Strain: Low Risk   . Difficulty of Paying Living Expenses: Not hard at all  Food Insecurity: No Food Insecurity  . Worried About Charity fundraiser in the Last Year: Never true  . Ran Out of Food in the Last Year: Never true  Transportation Needs: No Transportation Needs  . Lack of Transportation (Medical): No  . Lack of Transportation (Non-Medical): No  Physical Activity: Inactive  . Days of Exercise per Week: 0 days  . Minutes of Exercise per Session: 0 min  Stress: No Stress Concern Present  . Feeling of Stress : Not at all  Social Connections: Socially Integrated  . Frequency of Communication with Friends and Family: Twice a week  . Frequency of Social Gatherings with Friends and Family: More than three times a week  . Attends Religious Services: More than 4 times per year  . Active Member of Clubs  or Organizations: Yes  . Attends Archivist Meetings: More than 4 times per year  . Marital Status: Married    Tobacco Counseling Counseling given: Not Answered   Clinical Intake:  Pre-visit preparation completed: Yes  Pain : 0-10 Pain Score: 2  Pain Type: Chronic pain (Due to herniated disc.) Pain Location: Back Pain Orientation: Mid Pain Descriptors / Indicators: Aching Pain Frequency: Constant Pain Relieving Factors: Takes Tylenol as needed for pain.  Pain Relieving Factors: Takes Tylenol as needed for pain.  Nutritional Risks: None Diabetes: Yes  How often do you need to have someone help you when you read instructions, pamphlets, or other written materials from your doctor or pharmacy?: 1 - Never  Diabetic? Yes  Nutrition Risk Assessment:  Has the patient had any N/V/D within the last 2 months?  No  Does the patient have any non-healing wounds?  No  Has the patient had any unintentional weight loss or weight gain?  No   Diabetes:  Is the  patient diabetic?  Yes  If diabetic, was a CBG obtained today?  No  Did the patient bring in their glucometer from home?  No  How often do you monitor your CBG's? Once a day before breakfast.   Financial Strains and Diabetes Management:  Are you having any financial strains with the device, your supplies or your medication? No .  Does the patient want to be seen by Chronic Care Management for management of their diabetes?  No  Would the patient like to be referred to a Nutritionist or for Diabetic Management?  No   Diabetic Exams:  Diabetic Eye Exam: Completed 10/27/19 Diabetic Foot Exam: Overdue, Pt has been advised about the importance in completing this exam.    Interpreter Needed?: No  Information entered by :: [facility], LPN   Activities of Daily Living In your present state of health, do you have any difficulty performing the following activities: 09/06/2020 04/13/2020  Hearing? N N  Vision? N N  Difficulty concentrating or making decisions? N N  Walking or climbing stairs? N N  Dressing or bathing? N N  Doing errands, shopping? N N  Preparing Food and eating ? N -  Using the Toilet? N -  In the past six months, have you accidently leaked urine? N -  Do you have problems with loss of bowel control? N -  Managing your Medications? N -  Managing your Finances? N -  Housekeeping or managing your Housekeeping? N -  Some recent data might be hidden    Patient Care Team: Rubye Beach as PCP - General (Physician Assistant) Lovell Sheehan, MD as Consulting Physician (Orthopedic Surgery) [facility], [facility])  Indicate any recent Medical Services you may have received from other than [facility] providers in the past year (date may be approximate).     Assessment:   This is a routine wellness examination for Debbora.  Hearing/Vision screen No exam data present  Dietary issues and exercise activities discussed: Current Exercise Habits: The patient  does not participate in regular exercise at present, Exercise limited by: None identified  Goals    . DIET - REDUCE SUGAR INTAKE     Recommend cutting out sweets in daily diet. Pt to avoid eating desserts to help aid in weight loss and help diabetes.        Depression Screen PHQ 2/9 Scores 09/06/2020 04/13/2020 08/14/2019 08/12/2018 08/12/2018 07/13/2017 07/13/2016  PHQ - 2 Score 0 0 0 0  0 0 0  PHQ- 9 Score - 2 - 1 - - -    Fall Risk Fall Risk  09/06/2020 04/13/2020 08/14/2019 08/12/2018 07/13/2017  Falls in the past year? 0 0 0 0 No  Number falls in past yr: 0 0 0 - -  Injury with Fall? 0 0 0 - -  Risk for fall due to : - No Fall Risks - - -  Follow up - Falls evaluation completed - - -    FALL RISK PREVENTION PERTAINING TO THE HOME:  Any stairs in or around the home? No  If so, are there any without handrails? No  Home free of loose throw rugs in walkways, pet beds, electrical cords, etc? No  Adequate lighting in your home to reduce risk of falls? No   ASSISTIVE DEVICES UTILIZED TO PREVENT FALLS:  Life alert? No  Use of a cane, walker or w/c? No  Grab bars in the bathroom? Yes  Shower chair or bench in shower? No  Elevated toilet seat or a handicapped toilet? No    Cognitive Function: Normal cognitive status assessed by observation by this Nurse Health Advisor. No abnormalities found.          Immunizations Immunization History  Administered Date(s) Administered  . Moderna Sars-Covid-2 Vaccination 08/15/2019, 09/12/2019, 05/23/2020  . Pneumococcal Conjugate-13 05/19/2013  . Pneumococcal Polysaccharide-23 01/05/2006, 05/03/2009  . Td 08/19/2009  . Tdap 08/19/2009  . Zoster 08/19/2009    TDAP status: Due, Education has been provided regarding the importance of this vaccine. Advised may receive this vaccine at local pharmacy or Health Dept. Aware to provide a copy of the vaccination record if obtained from local pharmacy or Health Dept. Verbalized acceptance and  understanding.  Flu Vaccine status: Declined, Education has been provided regarding the importance of this vaccine but patient still declined. Advised may receive this vaccine at local pharmacy or Health Dept. Aware to provide a copy of the vaccination record if obtained from local pharmacy or Health Dept. Verbalized acceptance and understanding.  Pneumococcal vaccine status: Up to date  Covid-19 vaccine status: Completed vaccines  Qualifies for Shingles Vaccine? Yes   Zostavax completed Yes   Shingrix Completed?: No.    Education has been provided regarding the importance of this vaccine. Patient has been advised to call insurance company to determine out of pocket expense if they have not yet received this vaccine. Advised may also receive vaccine at local pharmacy or Health Dept. Verbalized acceptance and understanding.  Screening Tests Health Maintenance  Topic Date Due  . FOOT EXAM  08/27/2020  . HEMOGLOBIN A1C  08/28/2020  . INFLUENZA VACCINE  09/30/2020 (Originally 02/01/2020)  . TETANUS/TDAP  08/27/2029 (Originally 08/20/2019)  . OPHTHALMOLOGY EXAM  10/26/2020  . COLONOSCOPY (Pts 45-67yrs Insurance coverage will need to be confirmed)  10/15/2021  . DEXA SCAN  11/20/2023  . COVID-19 Vaccine  Completed  . PNA vac Low Risk Adult  Completed  . HPV VACCINES  Aged Out    Health Maintenance  Health Maintenance Due  Topic Date Due  . FOOT EXAM  08/27/2020  . HEMOGLOBIN A1C  08/28/2020    Colorectal cancer screening: Type of screening: Colonoscopy. Completed 10/16/19. Repeat every 2 years  Mammogram status: No longer required due to age.  Bone Density status: Completed 11/20/18. Results reflect: Bone density results: OSTEOPENIA. Repeat every 5 years.  Lung Cancer Screening: (Low Dose CT Chest recommended if Age 33-80 years, 30 pack-year currently smoking OR have quit w/in 15years.) does  not qualify.   Additional Screening:  Vision Screening: Recommended annual ophthalmology  exams for early detection of glaucoma and other disorders of the eye. Is the patient up to date with their annual eye exam?  Yes  Who is the provider or what is the name of the office in which the patient attends annual eye exams? [place] If pt is not established with a provider, would they like to be referred to a provider to establish care? No .   Dental Screening: Recommended annual dental exams for proper oral hygiene  Community Resource Referral / Chronic Care Management: CRR required this visit?  No   CCM required this visit?  No      Plan:     I have personally reviewed and noted the following in the patient's chart:   . Medical and social history . Use of alcohol, tobacco or illicit drugs  . Current medications and supplements . Functional ability and status . Nutritional status . Physical activity . Advanced directives . List of other physicians . Hospitalizations, surgeries, and ER visits in previous 12 months . Vitals . Screenings to include cognitive, depression, and falls . Referrals and appointments  In addition, I have reviewed and discussed with patient certain preventive protocols, quality metrics, and best practice recommendations. A written personalized care plan for preventive services as well as general preventive health recommendations were provided to patient.     McKenzie Wilson, [place]   09/07/3426   Nurse Notes: Pt needs a diabetic foot exam and Hgb A1c check at next in office apt.

## 2020-09-06 ENCOUNTER — Other Ambulatory Visit: Payer: Self-pay

## 2020-09-06 ENCOUNTER — Ambulatory Visit (INDEPENDENT_AMBULATORY_CARE_PROVIDER_SITE_OTHER): Payer: Medicare Other

## 2020-09-06 DIAGNOSIS — Z0000 Encounter for general adult medical examination without abnormal findings: Secondary | ICD-10-CM | POA: Diagnosis not present

## 2020-09-06 NOTE — Patient Instructions (Signed)
Kathleen Tran , Thank you for taking time to come for your Medicare Wellness Visit. I appreciate your ongoing commitment to your health goals. Please review the following plan we discussed and let me know if I can assist you in the future.   Screening recommendations/referrals: Colonoscopy: Up to date, due 10/2021 Mammogram: No longer required.  Bone Density: Up to date, due 11/2023 Recommended yearly ophthalmology/optometry visit for glaucoma screening and checkup Recommended yearly dental visit for hygiene and checkup  Vaccinations: Influenza vaccine: Currently due, declined receiving.  Pneumococcal vaccine: Completed series Tdap vaccine: Currenlty due, declined receiving. Shingles vaccine: Shingrix discussed. Please contact your pharmacy for coverage information.     Advanced directives: Currently on file.   Conditions/risks identified: Recommend cutting out sweets in daily diet. Pt to avoid eating desserts to help aid in weight loss and help diabetes.   Next appointment: 09/30/20 @ 10:00 AM with [name]. Declined scheduling an AWV for 2023 at this time.    Preventive Care 25 Years and Older, Female Preventive care refers to lifestyle choices and visits with your health care provider that can promote health and wellness. What does preventive care include?  A yearly physical exam. This is also called an annual well check.  Dental exams once or twice a year.  Routine eye exams. Ask your health care provider how often you should have your eyes checked.  Personal lifestyle choices, including:  Daily care of your teeth and gums.  Regular physical activity.  Eating a healthy diet.  Avoiding tobacco and drug use.  Limiting alcohol use.  Practicing safe sex.  Taking low-dose aspirin every day.  Taking vitamin and mineral supplements as recommended by your health care provider. What happens during an annual well check? The services and screenings done by your health  care provider during your annual well check will depend on your age, overall health, lifestyle risk factors, and family history of disease. Counseling  Your health care provider may ask you questions about your:  Alcohol use.  Tobacco use.  Drug use.  Emotional well-being.  Home and relationship well-being.  Sexual activity.  Eating habits.  History of falls.  Memory and ability to understand (cognition).  Work and work Statistician.  Reproductive health. Screening  You may have the following tests or measurements:  Height, weight, and BMI.  Blood pressure.  Lipid and cholesterol levels. These may be checked every 5 years, or more frequently if you are over 69 years old.  Skin check.  Lung cancer screening. You may have this screening every year starting at age 50 if you have a 30-pack-year history of smoking and currently smoke or have quit within the past 15 years.  Fecal occult blood test (FOBT) of the stool. You may have this test every year starting at age 53.  Flexible sigmoidoscopy or colonoscopy. You may have a sigmoidoscopy every 5 years or a colonoscopy every 10 years starting at age 11.  Hepatitis C blood test.  Hepatitis B blood test.  Sexually transmitted disease (STD) testing.  Diabetes screening. This is done by checking your blood sugar (glucose) after you have not eaten for a while (fasting). You may have this done every 1-3 years.  Bone density scan. This is done to screen for osteoporosis. You may have this done starting at age 22.  Mammogram. This may be done every 1-2 years. Talk to your health care provider about how often you should have regular mammograms. Talk with your health care provider about  your test results, treatment options, and if necessary, the need for more tests. Vaccines  Your health care provider may recommend certain vaccines, such as:  Influenza vaccine. This is recommended every year.  Tetanus, diphtheria, and  acellular pertussis (Tdap, Td) vaccine. You may need a Td booster every 10 years.  Zoster vaccine. You may need this after age 40.  Pneumococcal 13-valent conjugate (PCV13) vaccine. One dose is recommended after age 27.  Pneumococcal polysaccharide (PPSV23) vaccine. One dose is recommended after age 74. Talk to your health care provider about which screenings and vaccines you need and how often you need them. This information is not intended to replace advice given to you by your health care provider. Make sure you discuss any questions you have with your health care provider. Document Released: 07/16/2015 Document Revised: 03/08/2016 Document Reviewed: 04/20/2015 Elsevier Interactive Patient Education  2017 [place] Prevention in the Home Falls can cause injuries. They can happen to people of all ages. There are many things you can do to make your home safe and to help prevent falls. What can I do on the outside of my home?  Regularly fix the edges of walkways and driveways and fix any cracks.  Remove anything that might make you trip as you walk through a door, such as a raised step or threshold.  Trim any bushes or trees on the path to your home.  Use bright outdoor lighting.  Clear any walking paths of anything that might make someone trip, such as rocks or tools.  Regularly check to see if handrails are loose or broken. Make sure that both sides of any steps have handrails.  Any raised decks and porches should have guardrails on the edges.  Have any leaves, snow, or ice cleared regularly.  Use sand or salt on walking paths during winter.  Clean up any spills in your garage right away. This includes oil or grease spills. What can I do in the bathroom?  Use night lights.  Install grab bars by the toilet and in the tub and shower. Do not use towel bars as grab bars.  Use non-skid mats or decals in the tub or shower.  If you need to sit down in the shower, use  a plastic, non-slip stool.  Keep the floor dry. Clean up any water that spills on the floor as soon as it happens.  Remove soap buildup in the tub or shower regularly.  Attach bath mats securely with double-sided non-slip rug tape.  Do not have throw rugs and other things on the floor that can make you trip. What can I do in the bedroom?  Use night lights.  Make sure that you have a light by your bed that is easy to reach.  Do not use any sheets or blankets that are too big for your bed. They should not hang down onto the floor.  Have a firm chair that has side arms. You can use this for support while you get dressed.  Do not have throw rugs and other things on the floor that can make you trip. What can I do in the kitchen?  Clean up any spills right away.  Avoid walking on wet floors.  Keep items that you use a lot in easy-to-reach places.  If you need to reach something above you, use a strong step stool that has a grab bar.  Keep electrical cords out of the way.  Do not use floor polish or wax  that makes floors slippery. If you must use wax, use non-skid floor wax.  Do not have throw rugs and other things on the floor that can make you trip. What can I do with my stairs?  Do not leave any items on the stairs.  Make sure that there are handrails on both sides of the stairs and use them. Fix handrails that are broken or loose. Make sure that handrails are as long as the stairways.  Check any carpeting to make sure that it is firmly attached to the stairs. Fix any carpet that is loose or worn.  Avoid having throw rugs at the top or bottom of the stairs. If you do have throw rugs, attach them to the floor with carpet tape.  Make sure that you have a light switch at the top of the stairs and the bottom of the stairs. If you do not have them, ask someone to add them for you. What else can I do to help prevent falls?  Wear shoes that:  Do not have high heels.  Have  rubber bottoms.  Are comfortable and fit you well.  Are closed at the toe. Do not wear sandals.  If you use a stepladder:  Make sure that it is fully opened. Do not climb a closed stepladder.  Make sure that both sides of the stepladder are locked into place.  Ask someone to hold it for you, if possible.  Clearly mark and make sure that you can see:  Any grab bars or handrails.  First and last steps.  Where the edge of each step is.  Use tools that help you move around (mobility aids) if they are needed. These include:  Canes.  Walkers.  Scooters.  Crutches.  Turn on the lights when you go into a dark area. Replace any light bulbs as soon as they burn out.  Set up your furniture so you have a clear path. Avoid moving your furniture around.  If any of your floors are uneven, fix them.  If there are any pets around you, be aware of where they are.  Review your medicines with your doctor. Some medicines can make you feel dizzy. This can increase your chance of falling. Ask your doctor what other things that you can do to help prevent falls. This information is not intended to replace advice given to you by your health care provider. Make sure you discuss any questions you have with your health care provider. Document Released: 04/15/2009 Document Revised: 11/25/2015 Document Reviewed: 07/24/2014 Elsevier Interactive Patient Education  2017 [employer].

## 2020-09-30 ENCOUNTER — Ambulatory Visit (INDEPENDENT_AMBULATORY_CARE_PROVIDER_SITE_OTHER): Payer: Medicare Other | Admitting: Physician Assistant

## 2020-09-30 ENCOUNTER — Other Ambulatory Visit: Payer: Self-pay

## 2020-09-30 ENCOUNTER — Encounter: Payer: Self-pay | Admitting: Physician Assistant

## 2020-09-30 VITALS — BP 143/85 | HR 64 | Temp 98.4°F | Ht 61.0 in | Wt 188.0 lb

## 2020-09-30 DIAGNOSIS — E119 Type 2 diabetes mellitus without complications: Secondary | ICD-10-CM

## 2020-09-30 DIAGNOSIS — E1151 Type 2 diabetes mellitus with diabetic peripheral angiopathy without gangrene: Secondary | ICD-10-CM | POA: Diagnosis not present

## 2020-09-30 DIAGNOSIS — I10 Essential (primary) hypertension: Secondary | ICD-10-CM

## 2020-09-30 DIAGNOSIS — R251 Tremor, unspecified: Secondary | ICD-10-CM | POA: Diagnosis not present

## 2020-09-30 DIAGNOSIS — Z0000 Encounter for general adult medical examination without abnormal findings: Secondary | ICD-10-CM

## 2020-09-30 DIAGNOSIS — E1169 Type 2 diabetes mellitus with other specified complication: Secondary | ICD-10-CM

## 2020-09-30 DIAGNOSIS — E7800 Pure hypercholesterolemia, unspecified: Secondary | ICD-10-CM

## 2020-09-30 DIAGNOSIS — B351 Tinea unguium: Secondary | ICD-10-CM

## 2020-09-30 DIAGNOSIS — E1140 Type 2 diabetes mellitus with diabetic neuropathy, unspecified: Secondary | ICD-10-CM

## 2020-09-30 MED ORDER — PRAVASTATIN SODIUM 80 MG PO TABS: ORAL_TABLET | ORAL | 3 refills | Status: DC

## 2020-09-30 MED ORDER — PREGABALIN 75 MG PO CAPS: 75.0000 mg | ORAL_CAPSULE | Freq: Two times a day (BID) | ORAL | 3 refills | Status: DC

## 2020-09-30 MED ORDER — METFORMIN HCL 1000 MG PO TABS
ORAL_TABLET | ORAL | 3 refills | Status: DC
Start: 2020-09-30 — End: 2021-09-20

## 2020-09-30 MED ORDER — LOSARTAN POTASSIUM 50 MG PO TABS
ORAL_TABLET | ORAL | 3 refills | Status: DC
Start: 2020-09-30 — End: 2021-10-19

## 2020-09-30 MED ORDER — GLIPIZIDE 5 MG PO TABS: ORAL_TABLET | ORAL | 3 refills | Status: DC

## 2020-09-30 NOTE — Progress Notes (Signed)
Annual Wellness Visit     Patient: Kathleen Tran, Female    DOB: Mar 05, 1941, 80 y.o.   MRN: [id number] Visit Date: 09/30/2020  Today's Provider: Mar Daring, PA-C   Chief Complaint  Patient presents with  . Annual Exam   Subjective    Kathleen Tran is a 80 y.o. female who presents today for her Annual Wellness Visit. She reports consuming a general diet. The patient does not participate in regular exercise at present. She generally feels well. She reports sleeping well. She does have additional problems to discuss today.   HPI   Worsening right hand tremors.    Patient Active Problem List   Diagnosis Date Noted  . Polyp of colon   . Polyp of ascending colon   . Osteoarthritis of right hip 01/08/2019  . Onychomycosis of multiple toenails with type 2 diabetes mellitus and peripheral angiopathy ([place]) 08/12/2018  . GERD (gastroesophageal reflux disease) 02/20/2018  . Positive colorectal cancer screening using Cologuard test   . Benign neoplasm of cecum   . Benign neoplasm of ascending colon   . Benign neoplasm of descending colon   . External hemorrhoids   . Arthritis 04/12/2015  . Type 2 diabetes mellitus with vascular disease ([place]) 04/12/2015  . Osteoporosis 04/12/2015  . Hypercholesterolemia 04/12/2015  . Essential hypertension 04/12/2015   Past Medical History:  Diagnosis Date  . Arthritis   . Diabetes mellitus without complication ([place])   . Dyspnea   . GERD (gastroesophageal reflux disease)   . Hepatitis    at age 26  . History of herniated intervertebral disc    lower back  . History of hiatal hernia   . Hyperlipidemia   . Hypertension   . Tremors of nervous system    HEAD   Social History   Tobacco Use  . Smoking status: Never Smoker  . Smokeless tobacco: Never Used  Vaping Use  . Vaping Use: Never used  Substance Use Topics  . Alcohol use: No  . Drug use: No   Allergies  Allergen Reactions  . Citrus Other (See Comments)    Rash  in mouth and on arms     Medications: Outpatient Medications Prior to Visit  Medication Sig  . acetaminophen (TYLENOL) 500 MG tablet Take 1,000 mg by mouth daily as needed for moderate pain or headache.  Marland Kitchen aspirin 81 MG chewable tablet Chew 1 tablet (81 mg total) by mouth daily. (Not twice a day)  . Blood Glucose Monitoring Suppl (ONETOUCH VERIO IQ SYSTEM) w/Device KIT Check blood sugar a.c.h.s.  . Calcium Carb-Cholecalciferol (CALCIUM 1000 + D PO) Take 3 tablets by mouth daily before lunch.  . Multiple Vitamins-Minerals (ADULT GUMMY PO) Take 2 tablets by mouth daily before lunch.  . Omega-3 Fatty Acids (FISH OIL PO) Take 2 capsules by mouth daily before lunch.   [name] Delica Lancets 78G MISC To check blood sugar once daily  . ONETOUCH VERIO test strip TEST BLOOD SUGAR ONCE DAILY  . vitamin B-12 (CYANOCOBALAMIN) 1000 MCG tablet Take 1,000 mcg by mouth daily before lunch.   . [DISCONTINUED] glipiZIDE (GLUCOTROL) 5 MG tablet TAKE 1/2 TABLET BY MOUTH EVERY DAY BEFORE SUPPER  . [DISCONTINUED] losartan (COZAAR) 50 MG tablet TAKE 1 TABLET(50 MG) BY MOUTH EVERY EVENING  . [DISCONTINUED] metFORMIN (GLUCOPHAGE) 1000 MG tablet TAKE 1 TABLET(1000 MG) BY MOUTH TWICE DAILY WITH A MEAL  . [DISCONTINUED] pravastatin (PRAVACHOL) 80 MG tablet TAKE 1 TABLET(80 MG) BY MOUTH EVERY EVENING  . [  DISCONTINUED] pregabalin (LYRICA) 75 MG capsule Take 1 capsule (75 mg total) by mouth 2 (two) times daily.   No facility-administered medications prior to visit.    Allergies  Allergen Reactions  . Citrus Other (See Comments)    Rash in mouth and on arms    Patient Care Team: Rubye Beach as PCP - General (Physician Assistant) Lovell Sheehan, MD as Consulting Physician (Orthopedic Surgery) Pa, [place] (Optometry)  Review of Systems  Constitutional: Negative.   HENT: Negative.   Eyes: Positive for itching. Negative for photophobia, pain, discharge, redness and visual disturbance.   Respiratory: Positive for cough. Negative for apnea, choking, chest tightness, shortness of breath, wheezing and stridor.   Cardiovascular: Negative.   Gastrointestinal: Negative.   Endocrine: Positive for cold intolerance. Negative for heat intolerance, polydipsia, polyphagia and polyuria.  Genitourinary: Negative.   Musculoskeletal: Positive for arthralgias and back pain. Negative for gait problem, joint swelling, myalgias, neck pain and neck stiffness.  Skin: Negative.   Allergic/Immunologic: Negative.   Neurological: Negative.   Hematological: Negative.   Psychiatric/Behavioral: Negative.         Objective    Vitals: BP (!) 143/85 (BP Location: Left Arm, Patient Position: Sitting, Cuff Size: Large)   Pulse 64   Temp 98.4 F (36.9 C) (Oral)   Ht $R'5\' 1"'ec$  (1.549 m)   Wt 188 lb (85.3 kg)   BMI 35.52 kg/m    Physical Exam Vitals reviewed.  Constitutional:      General: She is not in acute distress.    Appearance: Normal appearance. She is well-developed. She is not ill-appearing or diaphoretic.  HENT:     Head: Normocephalic and atraumatic.     Right Ear: External ear normal.     Left Ear: External ear normal.     Nose: Nose normal.     Mouth/Throat:     Pharynx: No oropharyngeal exudate.  Eyes:     General: No scleral icterus.       Right eye: No discharge.        Left eye: No discharge.     Conjunctiva/sclera: Conjunctivae normal.     Pupils: Pupils are equal, round, and reactive to light.  Neck:     Thyroid: No thyromegaly.     Vascular: No JVD.     Trachea: No tracheal deviation.  Cardiovascular:     Rate and Rhythm: Normal rate and regular rhythm.     Pulses: Normal pulses.     Heart sounds: Normal heart sounds. No murmur heard. No friction rub. No gallop.   Pulmonary:     Effort: Pulmonary effort is normal. No respiratory distress.     Breath sounds: Normal breath sounds. No wheezing or rales.  Chest:     Chest wall: No tenderness.  Abdominal:      General: Bowel sounds are normal. There is no distension.     Palpations: Abdomen is soft. There is no mass.     Tenderness: There is no abdominal tenderness. There is no guarding or rebound.  Musculoskeletal:        General: No tenderness. Normal range of motion.     Cervical back: Normal range of motion and neck supple.  Lymphadenopathy:     Cervical: No cervical adenopathy.  Skin:    General: Skin is warm and dry.     Findings: No rash.  Neurological:     Mental Status: She is alert and oriented to person, place, and time.  Psychiatric:        Behavior: Behavior normal.        Thought Content: Thought content normal.        Judgment: Judgment normal.     Most recent functional status assessment: In your present state of health, do you have any difficulty performing the following activities: 09/06/2020  Hearing? N  Vision? N  Difficulty concentrating or making decisions? N  Walking or climbing stairs? N  Dressing or bathing? N  Doing errands, shopping? N  Preparing Food and eating ? N  Using the Toilet? N  In the past six months, have you accidently leaked urine? N  Do you have problems with loss of bowel control? N  Managing your Medications? N  Managing your Finances? N  Housekeeping or managing your Housekeeping? N  Some recent data might be hidden   Most recent fall risk assessment: Fall Risk  09/06/2020  Falls in the past year? 0  Number falls in past yr: 0  Injury with Fall? 0  Risk for fall due to : -  Follow up -    Most recent depression screenings: PHQ 2/9 Scores 09/06/2020 04/13/2020  PHQ - 2 Score 0 0  PHQ- 9 Score - 2   Most recent cognitive screening: No flowsheet data found. Most recent Audit-C alcohol use screening Alcohol Use Disorder Test (AUDIT) 09/06/2020  1. How often do you have a drink containing alcohol? 0  2. How many drinks containing alcohol do you have on a typical day when you are drinking? 0  3. How often do you have six or more drinks  on one occasion? 0  AUDIT-C Score 0  Alcohol Brief Interventions/Follow-up AUDIT Score <7 follow-up not indicated   A score of 3 or more in women, and 4 or more in men indicates increased risk for alcohol abuse, EXCEPT if all of the points are from question 1   Results for orders placed or performed in visit on 09/30/20  CBC w/Diff/Platelet  Result Value Ref Range   WBC 5.6 3.4 - 10.8 x10E3/uL   RBC 4.02 3.77 - 5.28 x10E6/uL   Hemoglobin 12.5 11.1 - 15.9 g/dL   Hematocrit 37.6 34.0 - 46.6 %   MCV 94 79 - 97 fL   MCH 31.1 26.6 - 33.0 pg   MCHC 33.2 31.5 - 35.7 g/dL   RDW 13.0 11.7 - 15.4 %   Platelets 253 150 - 450 x10E3/uL   Neutrophils 56 Not Estab. %   Lymphs 28 Not Estab. %   Monocytes 8 Not Estab. %   Eos 7 Not Estab. %   Basos 1 Not Estab. %   Neutrophils Absolute 3.2 1.4 - 7.0 x10E3/uL   Lymphocytes Absolute 1.6 0.7 - 3.1 x10E3/uL   Monocytes Absolute 0.5 0.1 - 0.9 x10E3/uL   EOS (ABSOLUTE) 0.4 0.0 - 0.4 x10E3/uL   Basophils Absolute 0.1 0.0 - 0.2 x10E3/uL   Immature Granulocytes 0 Not Estab. %   Immature Grans (Abs) 0.0 0.0 - 0.1 x10E3/uL  Comprehensive Metabolic Panel (CMET)  Result Value Ref Range   Glucose 154 (H) 65 - 99 mg/dL   BUN 12 8 - 27 mg/dL   Creatinine, Ser 0.71 0.57 - 1.00 mg/dL   eGFR 87 >59 mL/min/1.73   BUN/Creatinine Ratio 17 12 - 28   Sodium 142 134 - 144 mmol/L   Potassium 4.5 3.5 - 5.2 mmol/L   Chloride 104 96 - 106 mmol/L   CO2 25 20 - 29 mmol/L  Calcium 10.1 8.7 - 10.3 mg/dL   Total Protein 6.5 6.0 - 8.5 g/dL   Albumin 4.0 3.7 - 4.7 g/dL   Globulin, Total 2.5 1.5 - 4.5 g/dL   Albumin/Globulin Ratio 1.6 1.2 - 2.2   Bilirubin Total 0.4 0.0 - 1.2 mg/dL   Alkaline Phosphatase 71 44 - 121 IU/L   AST 17 0 - 40 IU/L   ALT 15 0 - 32 IU/L  TSH  Result Value Ref Range   TSH 2.560 0.450 - 4.500 uIU/mL  Lipid Panel With LDL/HDL Ratio  Result Value Ref Range   Cholesterol, Total 182 100 - 199 mg/dL   Triglycerides 133 0 - 149 mg/dL   HDL 72  >39 mg/dL   VLDL Cholesterol Cal 23 5 - 40 mg/dL   LDL Chol Calc (NIH) 87 0 - 99 mg/dL   LDL/HDL Ratio 1.2 0.0 - 3.2 ratio  HgB A1c  Result Value Ref Range   Hgb A1c MFr Bld 6.6 (H) 4.8 - 5.6 %   Est. average glucose Bld gHb Est-mCnc 143 mg/dL    Assessment & Plan     Annual wellness visit done today including the all of the following: Reviewed patient's Family Medical History Reviewed and updated list of patient's medical providers Assessment of cognitive impairment was done Assessed patient's functional ability Established a written schedule for health screening [place] Completed and Reviewed  Exercise Activities and Dietary recommendations Goals    . DIET - REDUCE SUGAR INTAKE     Recommend cutting out sweets in daily diet. Pt to avoid eating desserts to help aid in weight loss and help diabetes.         Immunization History  Administered Date(s) Administered  . Moderna Sars-Covid-2 Vaccination 08/15/2019, 09/12/2019, 05/23/2020  . Pneumococcal Conjugate-13 05/19/2013  . Pneumococcal Polysaccharide-23 01/05/2006, 05/03/2009  . Td 08/19/2009  . Tdap 08/19/2009  . Zoster 08/19/2009    Health Maintenance  Topic Date Due  . FOOT EXAM  08/27/2020  . TETANUS/TDAP  08/27/2029 (Originally 08/20/2019)  . OPHTHALMOLOGY EXAM  10/26/2020  . INFLUENZA VACCINE  01/31/2021  . HEMOGLOBIN A1C  04/01/2021  . COLONOSCOPY (Pts 45-29yrs Insurance coverage will need to be confirmed)  10/15/2021  . DEXA SCAN  11/20/2023  . COVID-19 Vaccine  Completed  . PNA vac Low Risk Adult  Completed  . HPV VACCINES  Aged Out     Discussed health benefits of physical activity, and encouraged her to engage in regular exercise appropriate for her age and condition.    1. Annual physical exam Normal physical exam today. Will check labs as below and f/u pending lab results. If labs are stable and WNL she will not need to have these rechecked for one year at her next annual  physical exam. She is to call the office in the meantime if she has any acute issue, questions or concerns. - CBC w/Diff/Platelet - Comprehensive Metabolic Panel (CMET) - TSH - Lipid Panel With LDL/HDL Ratio - HgB A1c  2. Tremor of right hand Worsening. Referral to neurology placed. Suspected essential tremor but will refer for further evaluation and to r/o other reasons for tremor.  - Ambulatory referral to Neurology  3. Type 2 diabetes mellitus with diabetic neuropathy, without long-term current use of insulin (HCC) Stable. Diagnosis pulled for medication refill. Continue current medical treatment plan. Will check labs as below and f/u pending results. - glipiZIDE (GLUCOTROL) 5 MG tablet; TAKE 1/2 TABLET BY MOUTH EVERY DAY BEFORE SUPPER  Dispense: 45 tablet; Refill: 3 - metFORMIN (GLUCOPHAGE) 1000 MG tablet; TAKE 1 TABLET(1000 MG) BY MOUTH TWICE DAILY WITH A MEAL  Dispense: 180 tablet; Refill: 3 - pregabalin (LYRICA) 75 MG capsule; Take 1 capsule (75 mg total) by mouth 2 (two) times daily.  Dispense: 180 capsule; Refill: 3 - Comprehensive Metabolic Panel (CMET) - Lipid Panel With LDL/HDL Ratio - HgB A1c  4. Onychomycosis of multiple toenails with type 2 diabetes mellitus and peripheral angiopathy ([place]) Followed by podiatry.   5. Essential hypertension Stable. Diagnosis pulled for medication refill. Continue current medical treatment plan. Will check labs as below and f/u pending results. - losartan (COZAAR) 50 MG tablet; TAKE 1 TABLET(50 MG) BY MOUTH EVERY EVENING  Dispense: 90 tablet; Refill: 3 - Comprehensive Metabolic Panel (CMET) - Lipid Panel With LDL/HDL Ratio - HgB A1c  6. Hypercholesterolemia Stable. Diagnosis pulled for medication refill. Continue current medical treatment plan. Will check labs as below and f/u pending results. - pravastatin (PRAVACHOL) 80 MG tablet; TAKE 1 TABLET(80 MG) BY MOUTH EVERY EVENING  Dispense: 90 tablet; Refill: 3 - Comprehensive Metabolic Panel  (CMET) - Lipid Panel With LDL/HDL Ratio - HgB A1c  7. Type 2 diabetes mellitus with diabetic neuropathy, without long-term current use of insulin (HCC) Stable. Diagnosis pulled for medication refill. Continue current medical treatment plan. Will check labs as below and f/u pending results. - glipiZIDE (GLUCOTROL) 5 MG tablet; TAKE 1/2 TABLET BY MOUTH EVERY DAY BEFORE SUPPER  Dispense: 45 tablet; Refill: 3 - metFORMIN (GLUCOPHAGE) 1000 MG tablet; TAKE 1 TABLET(1000 MG) BY MOUTH TWICE DAILY WITH A MEAL  Dispense: 180 tablet; Refill: 3 - pregabalin (LYRICA) 75 MG capsule; Take 1 capsule (75 mg total) by mouth 2 (two) times daily.  Dispense: 180 capsule; Refill: 3 - Comprehensive Metabolic Panel (CMET) - Lipid Panel With LDL/HDL Ratio - HgB A1c   Return in about 6 months (around 04/01/2021), or if symptoms worsen or fail to improve, for t2dm.     Reynolds Bowl, PA-C, have reviewed all documentation for this visit. The documentation on 10/10/20 for the exam, diagnosis, procedures, and orders are all accurate and complete.   Rubye Beach  [facility] [phone] (phone) [phone] (fax)  [place]

## 2020-09-30 NOTE — Patient Instructions (Signed)
Preventive Care 78 Years and Older, Female Preventive care refers to lifestyle choices and visits with your health care provider that can promote health and wellness. This includes:  A yearly physical exam. This is also called an annual wellness visit.  Regular dental and eye exams.  Immunizations.  Screening for certain conditions.  Healthy lifestyle choices, such as: ? Eating a healthy diet. ? Getting regular exercise. ? Not using drugs or products that contain nicotine and tobacco. ? Limiting alcohol use. What can I expect for my preventive care visit? Physical exam Your health care provider will check your:  Height and weight. These may be used to calculate your BMI (body mass index). BMI is a measurement that tells if you are at a healthy weight.  Heart rate and blood pressure.  Body temperature.  Skin for abnormal spots. Counseling Your health care provider may ask you questions about your:  Past medical problems.  Family's medical history.  Alcohol, tobacco, and drug use.  Emotional well-being.  Home life and relationship well-being.  Sexual activity.  Diet, exercise, and sleep habits.  History of falls.  Memory and ability to understand (cognition).  Work and work Statistician.  Pregnancy and menstrual history.  Access to firearms. What immunizations do I need? Vaccines are usually given at various ages, according to a schedule. Your health care provider will recommend vaccines for you based on your age, medical history, and lifestyle or other factors, such as travel or where you work.   What tests do I need? Blood tests  Lipid and cholesterol levels. These may be checked every 5 years, or more often depending on your overall health.  Hepatitis C test.  Hepatitis B test. Screening  Lung cancer screening. You may have this screening every year starting at age 78 if you have a 30-pack-year history of smoking and currently smoke or have quit within  the past 15 years.  Colorectal cancer screening. ? All adults should have this screening starting at age 44 and continuing until age 78. ? Your health care provider may recommend screening at age 78 if you are at increased risk. ? You will have tests every 1-10 years, depending on your results and the type of screening test.  Diabetes screening. ? This is done by checking your blood sugar (glucose) after you have not eaten for a while (fasting). ? You may have this done every 1-3 years.  Mammogram. ? This may be done every 1-2 years. ? Talk with your health care provider about how often you should have regular mammograms.  Abdominal aortic aneurysm (AAA) screening. You may need this if you are a current or former smoker.  BRCA-related cancer screening. This may be done if you have a family history of breast, ovarian, tubal, or peritoneal cancers. Other tests  STD (sexually transmitted disease) testing, if you are at risk.  Bone density scan. This is done to screen for osteoporosis. You may have this done starting at age 78. Talk with your health care provider about your test results, treatment options, and if necessary, the need for more tests. Follow these instructions at home: Eating and drinking  Eat a diet that includes fresh fruits and vegetables, whole grains, lean protein, and low-fat dairy products. Limit your intake of foods with high amounts of sugar, saturated fats, and salt.  Take vitamin and mineral supplements as recommended by your health care provider.  Do not drink alcohol if your health care provider tells you not to drink.  If you drink alcohol: ? Limit how much you have to 0-1 drink a day. ? Be aware of how much alcohol is in your drink. In the [place] one drink equals one 12 oz bottle of beer (355 mL), one 5 oz glass of wine (148 mL), or one 1 oz glass of hard liquor (44 mL).   Lifestyle  Take daily care of your teeth and gums. Brush your teeth every morning  and night with fluoride toothpaste. Floss one time each day.  Stay active. Exercise for at least 30 minutes 5 or more days each week.  Do not use any products that contain nicotine or tobacco, such as cigarettes, e-cigarettes, and chewing tobacco. If you need help quitting, ask your health care provider.  Do not use drugs.  If you are sexually active, practice safe sex. Use a condom or other form of protection in order to prevent STIs (sexually transmitted infections).  Talk with your health care provider about taking a low-dose aspirin or statin.  Find healthy ways to cope with stress, such as: ? Meditation, yoga, or listening to music. ? Journaling. ? Talking to a trusted person. ? Spending time with friends and family. Safety  Always wear your seat belt while driving or riding in a vehicle.  Do not drive: ? If you have been drinking alcohol. Do not ride with someone who has been drinking. ? When you are tired or distracted. ? While texting.  Wear a helmet and other protective equipment during sports activities.  If you have firearms in your house, make sure you follow all gun safety procedures. What's next?  Visit your health care provider once a year for an annual wellness visit.  Ask your health care provider how often you should have your eyes and teeth checked.  Stay up to date on all vaccines. This information is not intended to replace advice given to you by your health care provider. Make sure you discuss any questions you have with your health care provider. Document Revised: 06/09/2020 Document Reviewed: 06/13/2018 Elsevier Patient Education  2021 Elsevier Inc.  

## 2020-10-01 LAB — CBC WITH DIFFERENTIAL/PLATELET
Basophils Absolute: 0.1 10*3/uL (ref 0.0–0.2)
Basos: 1 %
EOS (ABSOLUTE): 0.4 10*3/uL (ref 0.0–0.4)
Eos: 7 %
Hematocrit: 37.6 % (ref 34.0–46.6)
Hemoglobin: 12.5 g/dL (ref 11.1–15.9)
Immature Grans (Abs): 0 10*3/uL (ref 0.0–0.1)
Immature Granulocytes: 0 %
Lymphocytes Absolute: 1.6 10*3/uL (ref 0.7–3.1)
Lymphs: 28 %
MCH: 31.1 pg (ref 26.6–33.0)
MCHC: 33.2 g/dL (ref 31.5–35.7)
MCV: 94 fL (ref 79–97)
Monocytes Absolute: 0.5 10*3/uL (ref 0.1–0.9)
Monocytes: 8 %
Neutrophils Absolute: 3.2 10*3/uL (ref 1.4–7.0)
Neutrophils: 56 %
Platelets: 253 10*3/uL (ref 150–450)
RBC: 4.02 x10E6/uL (ref 3.77–5.28)
RDW: 13 % (ref 11.7–15.4)
WBC: 5.6 10*3/uL (ref 3.4–10.8)

## 2020-10-01 LAB — LIPID PANEL WITH LDL/HDL RATIO
Cholesterol, Total: 182 mg/dL (ref 100–199)
HDL: 72 mg/dL (ref >39)
LDL Chol Calc (NIH): 87 mg/dL (ref 0–99)
LDL/HDL Ratio: 1.2 ratio (ref 0.0–3.2)
Triglycerides: 133 mg/dL (ref 0–149)
VLDL Cholesterol Cal: 23 mg/dL (ref 5–40)

## 2020-10-01 LAB — COMPREHENSIVE METABOLIC PANEL
ALT: 15 IU/L (ref 0–32)
AST: 17 IU/L (ref 0–40)
Albumin/Globulin Ratio: 1.6 (ref 1.2–2.2)
Albumin: 4 g/dL (ref 3.7–4.7)
Alkaline Phosphatase: 71 IU/L (ref 44–121)
BUN/Creatinine Ratio: 17 (ref 12–28)
BUN: 12 mg/dL (ref 8–27)
Bilirubin Total: 0.4 mg/dL (ref 0.0–1.2)
CO2: 25 mmol/L (ref 20–29)
Calcium: 10.1 mg/dL (ref 8.7–10.3)
Chloride: 104 mmol/L (ref 96–106)
Creatinine, Ser: 0.71 mg/dL (ref 0.57–1.00)
Globulin, Total: 2.5 g/dL (ref 1.5–4.5)
Glucose: 154 mg/dL — ABNORMAL HIGH (ref 65–99)
Potassium: 4.5 mmol/L (ref 3.5–5.2)
Sodium: 142 mmol/L (ref 134–144)
Total Protein: 6.5 g/dL (ref 6.0–8.5)
eGFR: 87 mL/min/{1.73_m2} (ref >59)

## 2020-10-01 LAB — HEMOGLOBIN A1C
Est. average glucose Bld gHb Est-mCnc: 143 mg/dL
Hgb A1c MFr Bld: 6.6 % — ABNORMAL HIGH (ref 4.8–5.6)

## 2020-10-01 LAB — TSH: TSH: 2.56 u[IU]/mL (ref 0.450–4.500)

## 2020-10-04 ENCOUNTER — Telehealth: Payer: Self-pay

## 2020-10-04 NOTE — Telephone Encounter (Signed)
-----   Message from Mar Daring, [place] sent at 10/04/2020 12:35 PM EDT ----- Ali Lowe,  A1c did increase just slightly from 6.5 to 6.6. Continue medications and lifestyle modifications. All other labs are normal.   Best Wishes, Grace Bushy, PAC

## 2020-10-04 NOTE — Telephone Encounter (Signed)
Pt advised.   Thanks,   -Stanislawa Gaffin  

## 2020-10-10 ENCOUNTER — Encounter: Payer: Self-pay | Admitting: Physician Assistant

## 2020-10-28 LAB — HM DIABETES EYE EXAM

## 2020-10-31 ENCOUNTER — Other Ambulatory Visit: Payer: Self-pay | Admitting: Physician Assistant

## 2020-10-31 DIAGNOSIS — E1140 Type 2 diabetes mellitus with diabetic neuropathy, unspecified: Secondary | ICD-10-CM

## 2020-12-02 ENCOUNTER — Telehealth: Payer: Self-pay

## 2020-12-02 NOTE — Telephone Encounter (Signed)
Noted. Please update medication list and remove Lyrica

## 2020-12-02 NOTE — Telephone Encounter (Signed)
Copied from [place] [id number]. Topic: General - Other >> Dec 02, 2020  1:22 PM Mcneil, Ja-Kwan wrote: Reason for CRM: Pt reports that she stopped taking pregabalin (LYRICA) 75 MG capsule. Pt stated she just wanted to make her doctor aware that she stopped taking it.

## 2020-12-02 NOTE — Telephone Encounter (Signed)
Done.   Thanks,   Mickel Baas

## 2020-12-20 ENCOUNTER — Other Ambulatory Visit: Payer: Self-pay | Admitting: Neurology

## 2020-12-20 DIAGNOSIS — R251 Tremor, unspecified: Secondary | ICD-10-CM

## 2020-12-23 ENCOUNTER — Ambulatory Visit: Payer: Medicare Other

## 2021-01-07 ENCOUNTER — Telehealth: Payer: Self-pay

## 2021-01-07 NOTE — Telephone Encounter (Signed)
Copied from [place] [id number]. Topic: Referral - Question >> Jan 07, 2021 10:47 AM Pawlus, Brayton Layman A wrote: Reason for CRM: Pt wanted to know if she could get a referral to [facility] to get her hands checked, pt stated she has already seen them for issues with her hip. Please advise.

## 2021-01-07 NOTE — Telephone Encounter (Signed)
What is happening to hands? Should schedule appointment her to be able to make a referral. If this is more arthritis problems, may be able to schedule directly with [facility] since she is already a patient there.

## 2021-01-19 ENCOUNTER — Telehealth: Payer: Self-pay | Admitting: Physician Assistant

## 2021-01-19 NOTE — Telephone Encounter (Signed)
[facility] [facility] faxed refill request for the following medications:   gabapentin (NEURONTIN) 400 MG capsule   NOT ON CURRENT MED LIST   Please advise.

## 2021-01-20 NOTE — Telephone Encounter (Signed)
Check outside medication sources. Looks like it was prescribed at [facility]. Will need to talk to physician that prescribed it

## 2021-01-21 NOTE — Telephone Encounter (Signed)
Copied from [place] [id number]. Topic: Referral - Request for Referral >> Jan 21, 2021 11:04 AM Alanda Slim E wrote: Has patient seen PCP for this complaint? yes *If NO, is insurance requiring patient see PCP for this issue before PCP can refer them? Referral for which specialty: orthopedic  Preferred provider/office: [facility]  Reason for referral: Hands are so bad/(Pain) pt can barely use them / arthritis/ Pt stated she requested referral about 3-4 weeks ago / please advise

## 2021-01-21 NOTE — Telephone Encounter (Signed)
Reviewing record medication was prescribed by Dr. Manuella Ghazi, will notify patient, [facility] pec please advise patient to contact Dr. Manuella Ghazi. KW

## 2021-01-21 NOTE — Telephone Encounter (Signed)
Call to patient- patient states that Dr Manuella Ghazi did originally prescribe the Neurontin- but she feels she does not need to see him anymore. Patient is requesting that Dr Brita Romp take over this Rx if she would. Patient states she "just needs to get her hands fixed"- she can barely use them. Advised patient I would forward request for review.

## 2021-01-21 NOTE — Telephone Encounter (Signed)
Please review message below. KW

## 2021-01-23 NOTE — Telephone Encounter (Signed)
Will have to find a spot to work patient in so I will know what I am referring. May need rheumatologist or an orthopedist.

## 2021-01-24 MED ORDER — GABAPENTIN 400 MG PO CAPS: 400.0000 mg | ORAL_CAPSULE | Freq: Three times a day (TID) | ORAL | 1 refills | Status: DC

## 2021-01-24 NOTE — Telephone Encounter (Signed)
Ok to refill as previously Rx'd. Thanks!

## 2021-04-04 ENCOUNTER — Ambulatory Visit: Payer: Medicare Other | Admitting: Family Medicine

## 2021-04-16 ENCOUNTER — Other Ambulatory Visit: Payer: Self-pay | Admitting: Physician Assistant

## 2021-04-16 DIAGNOSIS — E119 Type 2 diabetes mellitus without complications: Secondary | ICD-10-CM

## 2021-04-25 ENCOUNTER — Encounter: Payer: Self-pay | Admitting: Family Medicine

## 2021-04-25 ENCOUNTER — Other Ambulatory Visit: Payer: Self-pay

## 2021-04-25 ENCOUNTER — Ambulatory Visit (INDEPENDENT_AMBULATORY_CARE_PROVIDER_SITE_OTHER): Payer: Medicare Other | Admitting: Family Medicine

## 2021-04-25 VITALS — BP 132/60 | HR 60 | Temp 95.9°F | Ht 61.0 in | Wt 188.9 lb

## 2021-04-25 DIAGNOSIS — E1159 Type 2 diabetes mellitus with other circulatory complications: Secondary | ICD-10-CM | POA: Diagnosis not present

## 2021-04-25 DIAGNOSIS — M65332 Trigger finger, left middle finger: Secondary | ICD-10-CM | POA: Insufficient documentation

## 2021-04-25 DIAGNOSIS — E785 Hyperlipidemia, unspecified: Secondary | ICD-10-CM

## 2021-04-25 DIAGNOSIS — M19049 Primary osteoarthritis, unspecified hand: Secondary | ICD-10-CM | POA: Diagnosis not present

## 2021-04-25 DIAGNOSIS — I152 Hypertension secondary to endocrine disorders: Secondary | ICD-10-CM

## 2021-04-25 DIAGNOSIS — E1169 Type 2 diabetes mellitus with other specified complication: Secondary | ICD-10-CM | POA: Diagnosis not present

## 2021-04-25 DIAGNOSIS — Z23 Encounter for immunization: Secondary | ICD-10-CM

## 2021-04-25 LAB — POCT GLYCOSYLATED HEMOGLOBIN (HGB A1C): Hemoglobin A1C: 6.6 % — AB (ref 4.0–5.6)

## 2021-04-25 NOTE — Assessment & Plan Note (Signed)
Well controlled Continue current medications Recheck metabolic panel F/u in 6 months  

## 2021-04-25 NOTE — Assessment & Plan Note (Signed)
Previously well controlled reviewed last FLP and CMP

## 2021-04-25 NOTE — Assessment & Plan Note (Signed)
Well-controlled Continue current medications Needs foot exam at next visit Recheck A1c at next visit

## 2021-04-25 NOTE — Patient Instructions (Addendum)
The CDC recommends two doses of Shingrix (the shingles vaccine) separated by 2 to 6 months for adults age 78 years and older. I recommend checking with your insurance plan regarding coverage for this vaccine.    Try Voltaren gel up to 4 times daily for hand arthritis

## 2021-04-25 NOTE — Progress Notes (Signed)
+     Established patient visit   Patient: Kathleen Tran   DOB: [birth year]   80 y.o. Female  MRN: [id number] Visit Date: 04/25/2021  Today's healthcare provider: Lavon Paganini, MD   Chief Complaint  Patient presents with   Hypertension   Hyperlipidemia   Subjective    HPI  Diabetes Mellitus Type II, follow-up  Lab Results  Component Value Date   HGBA1C 6.6 (A) 04/25/2021   HGBA1C 6.6 (H) 09/30/2020   HGBA1C 6.5 (A) 02/26/2020   Last seen for diabetes 6 months ago.  Management since then includes continuing the same treatment. She reports good compliance with treatment. She is not having side effects.   Home blood sugar records: fasting range: 120s-145s  Episodes of hypoglycemia? Yes hot, dizziness, nausea   Current insulin regiment: none Most Recent Eye Exam: UTD  --------------------------------------------------------------------------------------------------- Hypertension, follow-up  BP Readings from Last 3 Encounters:  04/25/21 132/60  09/30/20 (!) 143/85  04/09/20 119/78   Wt Readings from Last 3 Encounters:  04/25/21 188 lb 14.4 oz (85.7 kg)  09/30/20 188 lb (85.3 kg)  04/09/20 184 lb (83.5 kg)     She was last seen for hypertension 6 months ago.  BP at that visit was 143/85. Management since that visit includes continue same treatment. She reports good compliance with treatment. She is not having side effects.  She is not exercising. She is adherent to low salt diet.   Outside blood pressures are yes.  She does not smoke.  Use of agents associated with hypertension: none.   --------------------------------------------------------------------------------------------------- Lipid/Cholesterol, follow-up  Last Lipid Panel: Lab Results  Component Value Date   CHOL 182 09/30/2020   LDLCALC 87 09/30/2020   HDL 72 09/30/2020   TRIG 133 09/30/2020    She was last seen for this 6 months ago.  Management since that visit includes continue  current medication.  She reports good compliance with treatment. She is not having side effects.   Symptoms: No appetite changes No foot ulcerations  No chest pain No chest pressure/discomfort  No dyspnea No orthopnea  No fatigue No lower extremity edema  No palpitations No paroxysmal nocturnal dyspnea  No nausea No numbness or tingling of extremity  No polydipsia No polyuria  No speech difficulty No syncope   She is following a Low Sodium diet. Current exercise: none  Last metabolic panel Lab Results  Component Value Date   GLUCOSE 154 (H) 09/30/2020   NA 142 09/30/2020   K 4.5 09/30/2020   BUN 12 09/30/2020   CREATININE 0.71 09/30/2020   EGFR 87 09/30/2020   GFRNONAA 87 08/28/2019   CALCIUM 10.1 09/30/2020   AST 17 09/30/2020   ALT 15 09/30/2020   The 10-year ASCVD risk score (Arnett DK, et al., 2019) is: 51%   B/l CMC arthritis. Hurting more. Difficulty bending L middle finger now. R hand dominant. Tylenol prn - mildly helpful. ---------------------------------------------------------------------------------------------------     Medications: Outpatient Medications Prior to Visit  Medication Sig   acetaminophen (TYLENOL) 500 MG tablet Take 1,000 mg by mouth daily as needed for moderate pain or headache.   aspirin 81 MG chewable tablet Chew 1 tablet (81 mg total) by mouth daily. (Not twice a day)   Blood Glucose Monitoring Suppl (ONETOUCH VERIO IQ SYSTEM) w/Device KIT Check blood sugar a.c.h.s.   Calcium Carb-Cholecalciferol (CALCIUM 1000 + D PO) Take 3 tablets by mouth daily before lunch.   gabapentin (NEURONTIN) 400 MG capsule Take 1 capsule (400 mg total) by  mouth 3 (three) times daily.   glipiZIDE (GLUCOTROL) 5 MG tablet TAKE 1/2 TABLET BY MOUTH EVERY DAY BEFORE SUPPER   losartan (COZAAR) 50 MG tablet TAKE 1 TABLET(50 MG) BY MOUTH EVERY EVENING   metFORMIN (GLUCOPHAGE) 1000 MG tablet TAKE 1 TABLET(1000 MG) BY MOUTH TWICE DAILY WITH A MEAL   Multiple  Vitamins-Minerals (ADULT GUMMY PO) Take 2 tablets by mouth daily before lunch.   Omega-3 Fatty Acids (FISH OIL PO) Take 2 capsules by mouth daily before lunch.    OneTouch Delica Lancets 19F MISC To check blood sugar once daily   ONETOUCH VERIO test strip USE TO TEST BLOOD SUGAR DAILY   pravastatin (PRAVACHOL) 80 MG tablet TAKE 1 TABLET(80 MG) BY MOUTH EVERY EVENING   vitamin B-12 (CYANOCOBALAMIN) 1000 MCG tablet Take 1,000 mcg by mouth daily before lunch.    No facility-administered medications prior to visit.    Review of Systems-Per HPI     Objective    BP 132/60   Pulse 60   Temp (!) 95.9 F (35.5 C) (Temporal)   Ht $R'5\' 1"'RW$  (1.549 m)   Wt 188 lb 14.4 oz (85.7 kg)   SpO2 98%   BMI 35.69 kg/m  {Show previous vital signs (optional):23777}  Physical Exam Vitals reviewed.  Constitutional:      General: She is not in acute distress.    Appearance: Normal appearance. She is well-developed. She is not diaphoretic.  HENT:     Head: Normocephalic and atraumatic.  Eyes:     General: No scleral icterus.    Conjunctiva/sclera: Conjunctivae normal.  Neck:     Thyroid: No thyromegaly.  Cardiovascular:     Rate and Rhythm: Normal rate and regular rhythm.     Pulses: Normal pulses.     Heart sounds: Normal heart sounds. No murmur heard. Pulmonary:     Effort: Pulmonary effort is normal. No respiratory distress.     Breath sounds: Normal breath sounds. No wheezing, rhonchi or rales.  Musculoskeletal:     Cervical back: Neck supple.     Right lower leg: No edema.     Left lower leg: No edema.     Comments: L middle finger trigger finger  Lymphadenopathy:     Cervical: No cervical adenopathy.  Skin:    General: Skin is warm and dry.     Findings: No rash.  Neurological:     Mental Status: She is alert and oriented to person, place, and time. Mental status is at baseline.  Psychiatric:        Mood and Affect: Mood normal.        Behavior: Behavior normal.      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    Well-controlled Continue current medications Needs foot exam at next visit Recheck A1c at next visit      Relevant Orders   POCT HgB A1C (Completed)   Hypertension associated with diabetes ([place])    Well controlled Continue current medications Recheck metabolic panel F/u in 6 months       Relevant Orders   Basic Metabolic Panel (BMET)     Endocrine   Hyperlipidemia  associated with type 2 diabetes mellitus ([place])    Previously well controlled reviewed last FLP and CMP        Musculoskeletal and Integument   Hand arthritis    Longstanding issue that is worsening Trial of Voltaren gel Given left trigger finger and worsening arthritis, will refer to hand surgery for future management      Relevant Orders   Ambulatory referral to Hand Surgery   Trigger middle finger of left hand   Relevant Orders   Ambulatory referral to Hand Surgery   Other Visit Diagnoses     Need for immunization against influenza       Relevant Orders   Flu Vaccine QUAD High Dose(Fluad) (Completed)        Return in about 6 months (around 10/24/2021) for AWV, CPE, With new PCP.      I, Lavon Paganini, MD, have reviewed all documentation for this visit. The documentation on 04/25/21 for the exam, diagnosis, procedures, and orders are all accurate and complete.   Kathleen Tran, Dionne Bucy, MD, MPH [facility]

## 2021-04-25 NOTE — Assessment & Plan Note (Signed)
Longstanding issue that is worsening Trial of Voltaren gel Given left trigger finger and worsening arthritis, will refer to hand surgery for future management

## 2021-04-26 LAB — BASIC METABOLIC PANEL
BUN/Creatinine Ratio: 17 (ref 12–28)
BUN: 11 mg/dL (ref 8–27)
CO2: 24 mmol/L (ref 20–29)
Calcium: 10.3 mg/dL (ref 8.7–10.3)
Chloride: 104 mmol/L (ref 96–106)
Creatinine, Ser: 0.64 mg/dL (ref 0.57–1.00)
Glucose: 100 mg/dL — ABNORMAL HIGH (ref 70–99)
Potassium: 4.8 mmol/L (ref 3.5–5.2)
Sodium: 143 mmol/L (ref 134–144)
eGFR: 90 mL/min/{1.73_m2} (ref >59)

## 2021-05-05 ENCOUNTER — Other Ambulatory Visit: Payer: Self-pay | Admitting: Physician Assistant

## 2021-05-05 ENCOUNTER — Telehealth: Payer: Self-pay | Admitting: Physician Assistant

## 2021-05-05 DIAGNOSIS — E119 Type 2 diabetes mellitus without complications: Secondary | ICD-10-CM

## 2021-05-05 MED ORDER — ONETOUCH VERIO IQ SYSTEM W/DEVICE KIT: PACK | 0 refills | Status: AC

## 2021-05-05 NOTE — Telephone Encounter (Signed)
Patient called in requesting new glucose machine.,

## 2021-07-20 ENCOUNTER — Other Ambulatory Visit: Payer: Self-pay

## 2021-07-20 NOTE — Telephone Encounter (Signed)
[facility] faxed refill request for the following medications:  gabapentin (NEURONTIN) 400 MG capsule   Please advise.

## 2021-07-20 NOTE — Telephone Encounter (Signed)
Please review for [place]   LOV: 04/25/2021  NOV: 10/26/2021  Last Refill:  01/24/2021 #270 with 1 Refill   Thanks,   -Mickel Baas

## 2021-07-21 MED ORDER — GABAPENTIN 400 MG PO CAPS: 400.0000 mg | ORAL_CAPSULE | Freq: Three times a day (TID) | ORAL | 1 refills | Status: DC

## 2021-08-25 DIAGNOSIS — G5603 Carpal tunnel syndrome, bilateral upper limbs: Secondary | ICD-10-CM | POA: Insufficient documentation

## 2021-09-15 ENCOUNTER — Other Ambulatory Visit: Payer: Self-pay | Admitting: Physician Assistant

## 2021-09-23 ENCOUNTER — Telehealth: Payer: Self-pay

## 2021-09-23 NOTE — Telephone Encounter (Signed)
Copied from [place] [id number]. Topic: General - Other ?>> Sep 23, 2021  1:38 PM Kathleen Tran wrote: ?Reason for CRM: Pt requests Rx for diabetic test kit that allows blood sugar level to be checked without sticking your fingers. ?

## 2021-09-26 ENCOUNTER — Other Ambulatory Visit: Payer: Self-pay | Admitting: Physician Assistant

## 2021-09-26 DIAGNOSIS — E1159 Type 2 diabetes mellitus with other circulatory complications: Secondary | ICD-10-CM

## 2021-09-26 MED ORDER — FREESTYLE LIBRE 2 SENSOR MISC: 3 refills | Status: DC

## 2021-09-26 MED ORDER — FREESTYLE LIBRE 2 READER DEVI: 0 refills | Status: DC

## 2021-10-10 ENCOUNTER — Telehealth: Payer: Self-pay | Admitting: Physician Assistant

## 2021-10-10 NOTE — Telephone Encounter (Signed)
Copied from [place] [phone] [id number]. Topic: Medicare AWV ?>> Oct 10, 2021 10:54 AM Cher Nakai R wrote: ?Reason for CRM:  ?Left message for patient to call back and schedule Medicare Annual Wellness Visit (AWV) in office.  ? ?If unable to come into the office for AWV,  please offer to do virtually or by telephone. ? ?Last AWV: 09/06/2020 ? ?Please schedule at anytime with [place]. ? ?30 minute appointment for Virtual or phone ?45 minute appointment for in office or Initial virtual/phone ? ?Any questions, please contact me at [phone] ?

## 2021-10-19 ENCOUNTER — Other Ambulatory Visit: Payer: Self-pay | Admitting: Physician Assistant

## 2021-10-19 DIAGNOSIS — I10 Essential (primary) hypertension: Secondary | ICD-10-CM

## 2021-10-24 ENCOUNTER — Telehealth: Payer: Self-pay | Admitting: Physician Assistant

## 2021-10-24 NOTE — Telephone Encounter (Signed)
[facility] faxed refill request for the following medications: ? ?glipiZIDE (GLUCOTROL) 5 MG tablet  ? ? ?Please advise. ? ?

## 2021-10-26 ENCOUNTER — Ambulatory Visit (INDEPENDENT_AMBULATORY_CARE_PROVIDER_SITE_OTHER): Payer: Medicare Other | Admitting: Physician Assistant

## 2021-10-26 ENCOUNTER — Encounter: Payer: Self-pay | Admitting: Physician Assistant

## 2021-10-26 VITALS — BP 130/60 | HR 60 | Temp 97.7°F | Ht 60.0 in | Wt 190.4 lb

## 2021-10-26 DIAGNOSIS — E1169 Type 2 diabetes mellitus with other specified complication: Secondary | ICD-10-CM | POA: Diagnosis not present

## 2021-10-26 DIAGNOSIS — Z0000 Encounter for general adult medical examination without abnormal findings: Secondary | ICD-10-CM

## 2021-10-26 DIAGNOSIS — E1140 Type 2 diabetes mellitus with diabetic neuropathy, unspecified: Secondary | ICD-10-CM | POA: Diagnosis not present

## 2021-10-26 DIAGNOSIS — R079 Chest pain, unspecified: Secondary | ICD-10-CM | POA: Diagnosis not present

## 2021-10-26 DIAGNOSIS — R609 Edema, unspecified: Secondary | ICD-10-CM | POA: Diagnosis not present

## 2021-10-26 DIAGNOSIS — E1159 Type 2 diabetes mellitus with other circulatory complications: Secondary | ICD-10-CM

## 2021-10-26 DIAGNOSIS — E785 Hyperlipidemia, unspecified: Secondary | ICD-10-CM

## 2021-10-26 DIAGNOSIS — I152 Hypertension secondary to endocrine disorders: Secondary | ICD-10-CM

## 2021-10-26 MED ORDER — FREESTYLE LIBRE 14 DAY READER DEVI: 0 refills | Status: DC

## 2021-10-26 MED ORDER — OMEPRAZOLE 20 MG PO CPDR: 20.0000 mg | DELAYED_RELEASE_CAPSULE | Freq: Every day | ORAL | 1 refills | Status: DC

## 2021-10-26 MED ORDER — GLIPIZIDE 5 MG PO TABS: ORAL_TABLET | ORAL | 2 refills | Status: DC

## 2021-10-26 MED ORDER — FREESTYLE LIBRE 2 SENSOR MISC: 3 refills | Status: DC

## 2021-10-26 NOTE — Progress Notes (Signed)
? ? ?I,Sha'taria Tyson,acting as a Education administrator for [employer], PA-C.,have documented all relevant documentation on the behalf of [name], PA-C,as directed by  [name], PA-C while in the presence of [name], PA-C. ? ? ?Complete physical exam ? ? ?Patient: Kathleen Tran   DOB: 1940-10-19   81 y.o. Female  MRN: [id number] ?Visit Date: 10/26/2021 ? ?Today's healthcare provider: [name], PA-C  ? ?Cc. cpe ? ?Subjective  ?  ?Kathleen Tran is a 81 y.o. female who presents today for a complete physical exam.  ?She reports consuming a general and watching salt and try to stay away from sugars  diet. The patient does not participate in regular exercise at present. She generally feels well. She reports sleeping well. She does have additional problems to discuss today.  ?HPI  ?She is concerned about chest pain today, points to midline chest. Nearly constant but waxes and wanes since a few days before easter. Reports worse after tomato based foods. Denies exacerbation with activity, breathing. Denies any radiation of pain, nausea, diaphoresis, dizziness, vision changes.  ? ? ?Past Medical History:  ?Diagnosis Date  ? Arthritis   ? Diabetes mellitus without complication ([place])   ? Dyspnea   ? GERD (gastroesophageal reflux disease)   ? Hepatitis   ? at age 28  ? History of herniated intervertebral disc   ? lower back  ? History of hiatal hernia   ? Hyperlipidemia   ? Hypertension   ? Tremors of nervous system   ? HEAD  ? ?Past Surgical History:  ?Procedure Laterality Date  ? APPENDECTOMY    ? CATARACT EXTRACTION W/PHACO Left 09/11/2018  ? Procedure: CATARACT EXTRACTION PHACO AND INTRAOCULAR LENS PLACEMENT (IOC) LEFT, DIABETIC;  Surgeon: Leandrew Koyanagi, MD;  Location: [facility] ORS;  Service: Ophthalmology;  Laterality: Left;  [place]  01:03 ?AP% 7.9 ?CDE 6.89 ?Fluid pack lot # [id number]  ? CATARACT EXTRACTION W/PHACO Right 02/11/2019  ? Procedure: CATARACT EXTRACTION PHACO AND INTRAOCULAR LENS PLACEMENT (IOC)  RIGHT, DIABETIC;  Surgeon: Leandrew Koyanagi, MD;  Location: [facility] ORS;  Service: Ophthalmology;  Laterality: Right;  [place] 00:51 ?AP% 11.6 ?CDE 8.20 ?fluid pack lot # [id number]  ? COLONOSCOPY WITH PROPOFOL N/A 09/26/2017  ? Procedure: COLONOSCOPY WITH PROPOFOL;  Surgeon: Virgel Manifold, MD;  Location: [facility] ENDOSCOPY;  Service: Endoscopy;  Laterality: N/A;  ? COLONOSCOPY WITH PROPOFOL N/A 10/16/2019  ? Procedure: COLONOSCOPY WITH PROPOFOL;  Surgeon: Lucilla Lame, MD;  Location: [facility] ENDOSCOPY;  Service: Endoscopy;  Laterality: N/A;  ? CYST EXCISION    ? "base of spine"  ? EYE SURGERY Left   ? cataract extraction  ? JOINT REPLACEMENT Left   ? hip replacement  ? PARTIAL HIP ARTHROPLASTY Right   ? TONSILLECTOMY    ? TOTAL HIP ARTHROPLASTY Left 01/08/2019  ? Procedure: TOTAL HIP ARTHROPLASTY ANTERIOR APPROACH;  Surgeon: Lovell Sheehan, MD;  Location: [facility] ORS;  Service: Orthopedics;  Laterality: Left;  ? ?Social History  ? ?Socioeconomic History  ? Marital status: Married  ?  Spouse name: [name] ...son  ? Number of children: 6  ? Years of education: Not on file  ? Highest education level: GED or equivalent  ?Occupational History  ? Occupation: retired  ?Tobacco Use  ? Smoking status: Never  ? Smokeless tobacco: Never  ?Vaping Use  ? Vaping Use: Never used  ?Substance and Sexual Activity  ? Alcohol use: No  ? Drug use: No  ? Sexual activity: Not on file  ?Other Topics Concern  ?  Not on file  ?Social History Narrative  ? Not on file  ? ?Social Determinants of Health  ? ?Financial Resource Strain: Not on file  ?Food Insecurity: Not on file  ?Transportation Needs: Not on file  ?Physical Activity: Not on file  ?Stress: Not on file  ?Social Connections: Not on file  ?Intimate Partner Violence: Not on file  ? ?Family Status  ?Relation Name Status  ? Mother  Deceased at age 12  ?     [place]  ? Father  Deceased at age 55  ?     heart cancer  ? ?History reviewed. No pertinent family history. ?Allergies  ?Allergen  Reactions  ? Citrus Other (See Comments)  ?  Rash in mouth and on arms ?Rash in mouth and on arms  ?  ?Patient Care Team: ?Emelia Loron as PCP - General (Physician Assistant) ?Lovell Sheehan, MD as Consulting Physician (Orthopedic Surgery) ?Pa, [facility])  ? ?Medications: ?Outpatient Medications Prior to Visit  ?Medication Sig  ? acetaminophen (TYLENOL) 500 MG tablet Take 1,000 mg by mouth daily as needed for moderate pain or headache.  ? aspirin 81 MG chewable tablet Chew 1 tablet (81 mg total) by mouth daily. (Not twice a day)  ? Blood Glucose Monitoring Suppl (ONETOUCH VERIO IQ SYSTEM) w/Device KIT Check blood sugar a.c.h.s.  ? Calcium Carb-Cholecalciferol (CALCIUM 1000 + D PO) Take 3 tablets by mouth daily before lunch.  ? gabapentin (NEURONTIN) 400 MG capsule Take 1 capsule (400 mg total) by mouth 3 (three) times daily.  ? losartan (COZAAR) 50 MG tablet TAKE 1 TABLET(50 MG) BY MOUTH EVERY EVENING  ? meloxicam (MOBIC) 15 MG tablet Take 15 mg by mouth daily.  ? metFORMIN (GLUCOPHAGE) 1000 MG tablet TAKE 1 TABLET(1000 MG) BY MOUTH TWICE DAILY WITH A MEAL  ? Multiple Vitamins-Minerals (ADULT GUMMY PO) Take 2 tablets by mouth daily before lunch.  ? Omega-3 Fatty Acids (FISH OIL PO) Take 2 capsules by mouth daily before lunch.   ? OneTouch Delica Lancets 38S MISC To check blood sugar once daily  ? ONETOUCH VERIO test strip USE TO TEST BLOOD SUGAR DAILY  ? pravastatin (PRAVACHOL) 80 MG tablet TAKE 1 TABLET(80 MG) BY MOUTH EVERY EVENING  ? vitamin B-12 (CYANOCOBALAMIN) 1000 MCG tablet Take 1,000 mcg by mouth daily before lunch.  ? [DISCONTINUED] glipiZIDE (GLUCOTROL) 5 MG tablet TAKE 1/2 TABLET BY MOUTH EVERY DAY BEFORE SUPPER  ? [DISCONTINUED] Continuous Blood Gluc Receiver (FREESTYLE LIBRE 2 READER) DEVI Use device to check blood sugar continuously-- recommended to check up to 4 times daily (Patient not taking: Reported on 10/26/2021)  ? [DISCONTINUED] Continuous Blood Gluc Sensor  (FREESTYLE LIBRE 2 SENSOR) MISC Continuous monitoring of blood sugar, please check device up to 4 times daily (Patient not taking: Reported on 10/26/2021)  ? ?No facility-administered medications prior to visit.  ? ? ?Review of Systems  ?Constitutional:  Positive for fatigue.  ?HENT:  Positive for drooling, mouth sores, nosebleeds, rhinorrhea and trouble swallowing.   ?Eyes:  Positive for itching.  ?Cardiovascular:  Positive for chest pain and palpitations.  ?Gastrointestinal:  Positive for abdominal distention, abdominal pain and diarrhea.  ?Endocrine: Positive for cold intolerance, heat intolerance and polyphagia.  ?Musculoskeletal:  Positive for arthralgias, back pain and neck pain.  ?Allergic/Immunologic: Positive for food allergies.  ?Neurological:  Positive for weakness and numbness.  ? ? ? Objective  ? ?  ?Blood pressure 130/60, pulse 60, temperature 97.7 ?F (36.5 ?C), temperature source Oral,  height 5' (1.524 m), weight 190 lb 6.4 oz (86.4 kg), SpO2 98 %.  ? ? ?Physical Exam ?Constitutional:   ?   General: She is awake.  ?   Appearance: She is well-developed. She is not ill-appearing.  ?HENT:  ?   Head: Normocephalic.  ?   Right Ear: Tympanic membrane normal.  ?   Left Ear: Tympanic membrane normal.  ?   Nose: Nose normal. No congestion or rhinorrhea.  ?   Mouth/Throat:  ?   Pharynx: No oropharyngeal exudate or posterior oropharyngeal erythema.  ?Eyes:  ?   Conjunctiva/sclera: Conjunctivae normal.  ?   Pupils: Pupils are equal, round, and reactive to light.  ?Neck:  ?   Thyroid: No thyroid mass or thyromegaly.  ?Cardiovascular:  ?   Rate and Rhythm: Normal rate and regular rhythm.  ?   Pulses:     ?     Dorsalis pedis pulses are 3+ on the right side and 3+ on the left side.  ?   Heart sounds: Normal heart sounds.  ?Pulmonary:  ?   Effort: Pulmonary effort is normal.  ?   Breath sounds: Normal breath sounds.  ?Abdominal:  ?   Palpations: Abdomen is soft.  ?   Tenderness: There is no abdominal tenderness.   ?Musculoskeletal:  ?   Right lower leg: Swelling present. 2+ Edema present.  ?   Left lower leg: Swelling present. 2+ Edema present.  ?Feet:  ?   Right foot:  ?   Protective Sensation: 3 sites tested.  3 sites sensed.  ?

## 2021-10-26 NOTE — Assessment & Plan Note (Signed)
Well-controlled on current medications 

## 2021-10-26 NOTE — Assessment & Plan Note (Signed)
Controlled on statin ?Will recheck lipid panel  For continued stability ?

## 2021-10-26 NOTE — Assessment & Plan Note (Addendum)
Will recheck a1c, continue current meds well controlled no episodes of hypoglycemia ?Foot exam completed today ?uacr ordered ?

## 2021-10-26 NOTE — Assessment & Plan Note (Addendum)
Unsure if typical for her, other other symptoms of fluid overload ?Will check bnp as she has never seen cardio, may consider echo ?

## 2021-10-26 NOTE — Assessment & Plan Note (Signed)
Etiology likely GERD ?ekg today normal sinus w/ no changes from 2020 ?rx omeprazole 20 mg advised take in AM on empty stomach ?Gave strict ed precautions ?

## 2021-10-27 LAB — COMPREHENSIVE METABOLIC PANEL
ALT: 20 IU/L (ref 0–32)
AST: 24 IU/L (ref 0–40)
Albumin/Globulin Ratio: 2 (ref 1.2–2.2)
Albumin: 4.2 g/dL (ref 3.7–4.7)
Alkaline Phosphatase: 62 IU/L (ref 44–121)
BUN/Creatinine Ratio: 23 (ref 12–28)
BUN: 14 mg/dL (ref 8–27)
Bilirubin Total: 0.3 mg/dL (ref 0.0–1.2)
CO2: 22 mmol/L (ref 20–29)
Calcium: 10.2 mg/dL (ref 8.7–10.3)
Chloride: 105 mmol/L (ref 96–106)
Creatinine, Ser: 0.61 mg/dL (ref 0.57–1.00)
Globulin, Total: 2.1 g/dL (ref 1.5–4.5)
Glucose: 103 mg/dL — ABNORMAL HIGH (ref 70–99)
Potassium: 5.1 mmol/L (ref 3.5–5.2)
Sodium: 144 mmol/L (ref 134–144)
Total Protein: 6.3 g/dL (ref 6.0–8.5)
eGFR: 91 mL/min/{1.73_m2} (ref >59)

## 2021-10-27 LAB — CBC WITH DIFFERENTIAL/PLATELET
Basophils Absolute: 0.1 10*3/uL (ref 0.0–0.2)
Basos: 1 %
EOS (ABSOLUTE): 0.4 10*3/uL (ref 0.0–0.4)
Eos: 7 %
Hematocrit: 38.2 % (ref 34.0–46.6)
Hemoglobin: 12.7 g/dL (ref 11.1–15.9)
Immature Grans (Abs): 0 10*3/uL (ref 0.0–0.1)
Immature Granulocytes: 0 %
Lymphocytes Absolute: 1.8 10*3/uL (ref 0.7–3.1)
Lymphs: 31 %
MCH: 31 pg (ref 26.6–33.0)
MCHC: 33.2 g/dL (ref 31.5–35.7)
MCV: 93 fL (ref 79–97)
Monocytes Absolute: 0.6 10*3/uL (ref 0.1–0.9)
Monocytes: 11 %
Neutrophils Absolute: 2.8 10*3/uL (ref 1.4–7.0)
Neutrophils: 50 %
Platelets: 274 10*3/uL (ref 150–450)
RBC: 4.1 x10E6/uL (ref 3.77–5.28)
RDW: 13 % (ref 11.7–15.4)
WBC: 5.6 10*3/uL (ref 3.4–10.8)

## 2021-10-27 LAB — MICROALBUMIN / CREATININE URINE RATIO
Creatinine, Urine: 55.3 mg/dL
Microalb/Creat Ratio: <5 mg/g creat (ref 0–29)
Microalbumin, Urine: <3 ug/mL

## 2021-10-27 LAB — HEMOGLOBIN A1C
Est. average glucose Bld gHb Est-mCnc: 148 mg/dL
Hgb A1c MFr Bld: 6.8 % — ABNORMAL HIGH (ref 4.8–5.6)

## 2021-10-27 LAB — LIPID PANEL
Chol/HDL Ratio: 2.5 ratio (ref 0.0–4.4)
Cholesterol, Total: 176 mg/dL (ref 100–199)
HDL: 70 mg/dL (ref >39)
LDL Chol Calc (NIH): 82 mg/dL (ref 0–99)
Triglycerides: 142 mg/dL (ref 0–149)
VLDL Cholesterol Cal: 24 mg/dL (ref 5–40)

## 2021-10-27 LAB — TSH+FREE T4
Free T4: 0.99 ng/dL (ref 0.82–1.77)
TSH: 2.15 u[IU]/mL (ref 0.450–4.500)

## 2021-11-01 ENCOUNTER — Telehealth: Payer: Self-pay | Admitting: Physician Assistant

## 2021-11-01 NOTE — Telephone Encounter (Signed)
Copied from [place]. Topic: Medicare AWV ?>> Nov 01, 2021 11:36 AM Cher Nakai R wrote: ?Reason for CRM:  ?Left message for patient to call back and schedule Medicare Annual Wellness Visit (AWV) in office.  ? ?If unable to come into the office for AWV,  please offer to do virtually or by telephone. ? ?Last AWV: 09/06/2020 ? ?Please schedule at anytime with [facility] Advisor. ? ?30 minute appointment for Virtual or phone ?45 minute appointment for in office or Initial virtual/phone ? ?Any questions, please contact me at [phone] ?

## 2021-11-04 ENCOUNTER — Telehealth: Payer: Self-pay | Admitting: Physician Assistant

## 2021-11-04 ENCOUNTER — Other Ambulatory Visit: Payer: Self-pay

## 2021-11-04 DIAGNOSIS — E7800 Pure hypercholesterolemia, unspecified: Secondary | ICD-10-CM

## 2021-11-04 MED ORDER — PRAVASTATIN SODIUM 80 MG PO TABS: ORAL_TABLET | ORAL | 1 refills | Status: DC

## 2021-11-04 NOTE — Telephone Encounter (Signed)
[facility] faxed refill request for the following medications: ? ?pravastatin (PRAVACHOL) 80 MG tablet  ? ?Please advise. ? ?

## 2021-11-07 ENCOUNTER — Ambulatory Visit (INDEPENDENT_AMBULATORY_CARE_PROVIDER_SITE_OTHER): Payer: Medicare Other

## 2021-11-07 VITALS — Wt 190.0 lb

## 2021-11-07 DIAGNOSIS — Z0000 Encounter for general adult medical examination without abnormal findings: Secondary | ICD-10-CM | POA: Diagnosis not present

## 2021-11-07 NOTE — Progress Notes (Signed)
?Virtual Visit via Telephone Note ? ?I connected with  Kathleen Tran on 11/07/21 at  9:15 AM EDT by telephone and verified that I am speaking with the correct person using two identifiers. ? ?Location: ?Patient: home ?Provider: [facility] ?Persons participating in the virtual visit: patient/Nurse Health Advisor ?  ?I discussed the limitations, risks, security and privacy concerns of performing an evaluation and management service by telephone and the availability of in person appointments. The patient expressed understanding and agreed to proceed. ? ?Interactive audio and video telecommunications were attempted between this nurse and patient, however failed, due to patient having technical difficulties OR patient did not have access to video capability.  We continued and completed visit with audio only. ? ?Some vital signs may be absent or patient reported.  ? ?Kathleen David, LPN ? ?Subjective:  ? Kathleen Tran is a 81 y.o. female who presents for Medicare Annual (Subsequent) preventive examination. ? ?Review of Systems    ? ?  ? ?   ?Objective:  ?  ?There were no vitals filed for this visit. ?There is no height or weight on file to calculate BMI. ? ? ?  09/06/2020  ? 11:13 AM 10/16/2019  ? 12:42 PM 08/14/2019  ? 11:09 AM 02/11/2019  ?  7:05 AM 02/11/2019  ?  7:02 AM 01/08/2019  ? 11:18 AM 01/01/2019  ? 11:00 AM  ?Advanced Directives  ?Does Patient Have a Medical Advance Directive? Yes Yes Yes Yes Yes No Yes  ?Type of Paramedic of [place];Living will Out of facility DNR (pink MOST or yellow form) [place];Living will  Living will  [place];Living will  ?Does patient want to make changes to medical advance directive?     No - Patient declined    ?Copy of [place] in Chart? Yes - validated most recent copy scanned in chart (See row information)  No - copy requested      ?Would patient like information on creating a medical advance directive?       No - Patient declined   ? ? ?Current Medications (verified) ?Outpatient Encounter Medications as of 11/07/2021  ?Medication Sig  ? acetaminophen (TYLENOL) 500 MG tablet Take 1,000 mg by mouth daily as needed for moderate pain or headache.  ? aspirin 81 MG chewable tablet Chew 1 tablet (81 mg total) by mouth daily. (Not twice a day)  ? Blood Glucose Monitoring Suppl (ONETOUCH VERIO IQ SYSTEM) w/Device KIT Check blood sugar a.c.h.s.  ? Calcium Carb-Cholecalciferol (CALCIUM 1000 + D PO) Take 3 tablets by mouth daily before lunch.  ? Continuous Blood Gluc Receiver (FREESTYLE LIBRE 14 DAY READER) DEVI Use to read sugars  ? Continuous Blood Gluc Sensor (FREESTYLE LIBRE 2 SENSOR) MISC Change every 14 days  ? gabapentin (NEURONTIN) 400 MG capsule Take 1 capsule (400 mg total) by mouth 3 (three) times daily.  ? glipiZIDE (GLUCOTROL) 5 MG tablet TAKE 1/2 TABLET BY MOUTH EVERY DAY BEFORE SUPPER  ? losartan (COZAAR) 50 MG tablet TAKE 1 TABLET(50 MG) BY MOUTH EVERY EVENING  ? meloxicam (MOBIC) 15 MG tablet Take 15 mg by mouth daily.  ? metFORMIN (GLUCOPHAGE) 1000 MG tablet TAKE 1 TABLET(1000 MG) BY MOUTH TWICE DAILY WITH A MEAL  ? Multiple Vitamins-Minerals (ADULT GUMMY PO) Take 2 tablets by mouth daily before lunch.  ? Omega-3 Fatty Acids (FISH OIL PO) Take 2 capsules by mouth daily before lunch.   ? omeprazole (PRILOSEC) 20 MG capsule Take  1 capsule (20 mg total) by mouth daily.  ? OneTouch Delica Lancets 83J MISC To check blood sugar once daily  ? ONETOUCH VERIO test strip USE TO TEST BLOOD SUGAR DAILY  ? pravastatin (PRAVACHOL) 80 MG tablet TAKE 1 TABLET(80 MG) BY MOUTH EVERY EVENING  ? vitamin B-12 (CYANOCOBALAMIN) 1000 MCG tablet Take 1,000 mcg by mouth daily before lunch.  ? ?No facility-administered encounter medications on file as of 11/07/2021.  ? ? ?Allergies (verified) ?Citrus  ? ?History: ?Past Medical History:  ?Diagnosis Date  ? Arthritis   ? Diabetes mellitus without complication ([place])   ? Dyspnea   ? GERD  (gastroesophageal reflux disease)   ? Hepatitis   ? at age 39  ? History of herniated intervertebral disc   ? lower back  ? History of hiatal hernia   ? Hyperlipidemia   ? Hypertension   ? Tremors of nervous system   ? HEAD  ? ?Past Surgical History:  ?Procedure Laterality Date  ? APPENDECTOMY    ? CATARACT EXTRACTION W/PHACO Left 09/11/2018  ? Procedure: CATARACT EXTRACTION PHACO AND INTRAOCULAR LENS PLACEMENT (IOC) LEFT, DIABETIC;  Surgeon: Leandrew Koyanagi, MD;  Location: [facility] ORS;  Service: Ophthalmology;  Laterality: Left;  [place]  01:03 ?AP% 7.9 ?CDE 6.89 ?Fluid pack lot # [id number]  ? CATARACT EXTRACTION W/PHACO Right 02/11/2019  ? Procedure: CATARACT EXTRACTION PHACO AND INTRAOCULAR LENS PLACEMENT (IOC) RIGHT, DIABETIC;  Surgeon: Leandrew Koyanagi, MD;  Location: [facility] ORS;  Service: Ophthalmology;  Laterality: Right;  [place] 00:51 ?AP% 11.6 ?CDE 8.20 ?fluid pack lot # [id number]  ? COLONOSCOPY WITH PROPOFOL N/A 09/26/2017  ? Procedure: COLONOSCOPY WITH PROPOFOL;  Surgeon: Virgel Manifold, MD;  Location: [facility] ENDOSCOPY;  Service: Endoscopy;  Laterality: N/A;  ? COLONOSCOPY WITH PROPOFOL N/A 10/16/2019  ? Procedure: COLONOSCOPY WITH PROPOFOL;  Surgeon: Lucilla Lame, MD;  Location: [facility] ENDOSCOPY;  Service: Endoscopy;  Laterality: N/A;  ? CYST EXCISION    ? "base of spine"  ? EYE SURGERY Left   ? cataract extraction  ? JOINT REPLACEMENT Left   ? hip replacement  ? PARTIAL HIP ARTHROPLASTY Right   ? TONSILLECTOMY    ? TOTAL HIP ARTHROPLASTY Left 01/08/2019  ? Procedure: TOTAL HIP ARTHROPLASTY ANTERIOR APPROACH;  Surgeon: Lovell Sheehan, MD;  Location: [facility] ORS;  Service: Orthopedics;  Laterality: Left;  ? ?No family history on file. ?Social History  ? ?Socioeconomic History  ? Marital status: Married  ?  Spouse name: Kathleen Tran ...son  ? Number of children: 6  ? Years of education: Not on file  ? Highest education level: GED or equivalent  ?Occupational History  ? Occupation: retired  ?Tobacco Use  ? Smoking status:  Never  ? Smokeless tobacco: Never  ?Vaping Use  ? Vaping Use: Never used  ?Substance and Sexual Activity  ? Alcohol use: No  ? Drug use: No  ? Sexual activity: Not on file  ?Other Topics Concern  ? Not on file  ?Social History Narrative  ? Not on file  ? ?Social Determinants of Health  ? ?Financial Resource Strain: Not on file  ?Food Insecurity: Not on file  ?Transportation Needs: Not on file  ?Physical Activity: Not on file  ?Stress: Not on file  ?Social Connections: Not on file  ? ? ?Tobacco Counseling ?Counseling given: Not Answered ? ? ?Clinical Intake: ? ?Pre-visit preparation completed: Yes ? ?Pain : No/denies pain ? ?  ? ?Nutritional Risks: None ?Diabetes: Yes ?CBG done?: No ?Did pt. bring in CBG  monitor from home?: No ? ?How often do you need to have someone help you when you read instructions, pamphlets, or other written materials from your doctor or pharmacy?: 1 - Never ? ?Diabetic?yes ?Nutrition Risk Assessment: ? ?Has the patient had any N/V/D within the last 2 months?  Yes  ?Does the patient have any non-healing wounds?  No  ?Has the patient had any unintentional weight loss or weight gain?  No  ? ?Diabetes: ? ?Is the patient diabetic?  Yes  ?If diabetic, was a CBG obtained today?  No  ?Did the patient bring in their glucometer from home?  No  ?How often do you monitor your CBG's? Every morning.  ? ?Financial Strains and Diabetes Management: ? ?Are you having any financial strains with the device, your supplies or your medication? No .  ?Does the patient want to be seen by Chronic Care Management for management of their diabetes?  No  ?Would the patient like to be referred to a Nutritionist or for Diabetic Management?  No  ? ?Diabetic Exams: ? ?Diabetic Eye Exam: Completed 10/28/20. Overdue for diabetic eye exam. Pt has been advised about the importance in completing this exam.  ? ?Diabetic Foot Exam: Completed 10/26/21. Pt has been advised about the importance in completing this exam. ? ? ?Interpreter  Needed?: No ? ?Information entered by :: Kirke Shaggy, LPN ? ? ?Activities of Daily Living ? ?  10/26/2021  ?  1:50 PM 04/25/2021  ?  2:45 PM  ?In your present state of health, do you have any difficulty performing

## 2021-11-07 NOTE — Patient Instructions (Signed)
Kathleen Tran , ?Thank you for taking time to come for your Medicare Wellness Visit. I appreciate your ongoing commitment to your health goals. Please review the following plan we discussed and let me know if I can assist you in the future.  ? ?Screening recommendations/referrals: ?Colonoscopy: aged out ?Mammogram: aged out  ?Bone Density: 11/20/18 ?Recommended yearly ophthalmology/optometry visit for glaucoma screening and checkup ?Recommended yearly dental visit for hygiene and checkup ? ?Vaccinations: ?Influenza vaccine: 04/25/21 ?Pneumococcal vaccine: 05/19/13 ?Tdap vaccine: 08/19/09, due ?Shingles vaccine: Zostavax 08/19/09   ?Covid-19:08/15/19, 09/12/19, 05/23/20 ? ?Advanced directives: no ? ?Conditions/risks identified: none ? ?Next appointment: Follow up in one year for your annual wellness visit 11/09/22 @ 9:45am by phone ? ? ?Preventive Care 79 Years and Older, Female ?Preventive care refers to lifestyle choices and visits with your health care provider that can promote health and wellness. ?What does preventive care include? ?A yearly physical exam. This is also called an annual well check. ?Dental exams once or twice a year. ?Routine eye exams. Ask your health care provider how often you should have your eyes checked. ?Personal lifestyle choices, including: ?Daily care of your teeth and gums. ?Regular physical activity. ?Eating a healthy diet. ?Avoiding tobacco and drug use. ?Limiting alcohol use. ?Practicing safe sex. ?Taking low-dose aspirin every day. ?Taking vitamin and mineral supplements as recommended by your health care provider. ?What happens during an annual well check? ?The services and screenings done by your health care provider during your annual well check will depend on your age, overall health, lifestyle risk factors, and family history of disease. ?Counseling  ?Your health care provider may ask you questions about your: ?Alcohol use. ?Tobacco use. ?Drug use. ?Emotional well-being. ?Home and  relationship well-being. ?Sexual activity. ?Eating habits. ?History of falls. ?Memory and ability to understand (cognition). ?Work and work Statistician. ?Reproductive health. ?Screening  ?You may have the following tests or measurements: ?Height, weight, and BMI. ?Blood pressure. ?Lipid and cholesterol levels. These may be checked every 5 years, or more frequently if you are over 21 years old. ?Skin check. ?Lung cancer screening. You may have this screening every year starting at age 24 if you have a 30-pack-year history of smoking and currently smoke or have quit within the past 15 years. ?Fecal occult blood test (FOBT) of the stool. You may have this test every year starting at age 77. ?Flexible sigmoidoscopy or colonoscopy. You may have a sigmoidoscopy every 5 years or a colonoscopy every 10 years starting at age 42. ?Hepatitis C blood test. ?Hepatitis B blood test. ?Sexually transmitted disease (STD) testing. ?Diabetes screening. This is done by checking your blood sugar (glucose) after you have not eaten for a while (fasting). You may have this done every 1-3 years. ?Bone density scan. This is done to screen for osteoporosis. You may have this done starting at age 5. ?Mammogram. This may be done every 1-2 years. Talk to your health care provider about how often you should have regular mammograms. ?Talk with your health care provider about your test results, treatment options, and if necessary, the need for more tests. ?Vaccines  ?Your health care provider may recommend certain vaccines, such as: ?Influenza vaccine. This is recommended every year. ?Tetanus, diphtheria, and acellular pertussis (Tdap, Td) vaccine. You may need a Td booster every 10 years. ?Zoster vaccine. You may need this after age 55. ?Pneumococcal 13-valent conjugate (PCV13) vaccine. One dose is recommended after age 38. ?Pneumococcal polysaccharide (PPSV23) vaccine. One dose is recommended after age 76. ?Talk  to your health care provider  about which screenings and vaccines you need and how often you need them. ?This information is not intended to replace advice given to you by your health care provider. Make sure you discuss any questions you have with your health care provider. ?Document Released: 07/16/2015 Document Revised: 03/08/2016 Document Reviewed: 04/20/2015 ?Elsevier Interactive Patient Education ? 2017 [place]. ? ?Fall Prevention in the Home ?Falls can cause injuries. They can happen to people of all ages. There are many things you can do to make your home safe and to help prevent falls. ?What can I do on the outside of my home? ?Regularly fix the edges of walkways and driveways and fix any cracks. ?Remove anything that might make you trip as you walk through a door, such as a raised step or threshold. ?Trim any bushes or trees on the path to your home. ?Use bright outdoor lighting. ?Clear any walking paths of anything that might make someone trip, such as rocks or tools. ?Regularly check to see if handrails are loose or broken. Make sure that both sides of any steps have handrails. ?Any raised decks and porches should have guardrails on the edges. ?Have any leaves, snow, or ice cleared regularly. ?Use sand or salt on walking paths during winter. ?Clean up any spills in your garage right away. This includes oil or grease spills. ?What can I do in the bathroom? ?Use night lights. ?Install grab bars by the toilet and in the tub and shower. Do not use towel bars as grab bars. ?Use non-skid mats or decals in the tub or shower. ?If you need to sit down in the shower, use a plastic, non-slip stool. ?Keep the floor dry. Clean up any water that spills on the floor as soon as it happens. ?Remove soap buildup in the tub or shower regularly. ?Attach bath mats securely with double-sided non-slip rug tape. ?Do not have throw rugs and other things on the floor that can make you trip. ?What can I do in the bedroom? ?Use night lights. ?Make sure  that you have a light by your bed that is easy to reach. ?Do not use any sheets or blankets that are too big for your bed. They should not hang down onto the floor. ?Have a firm chair that has side arms. You can use this for support while you get dressed. ?Do not have throw rugs and other things on the floor that can make you trip. ?What can I do in the kitchen? ?Clean up any spills right away. ?Avoid walking on wet floors. ?Keep items that you use a lot in easy-to-reach places. ?If you need to reach something above you, use a strong step stool that has a grab bar. ?Keep electrical cords out of the way. ?Do not use floor polish or wax that makes floors slippery. If you must use wax, use non-skid floor wax. ?Do not have throw rugs and other things on the floor that can make you trip. ?What can I do with my stairs? ?Do not leave any items on the stairs. ?Make sure that there are handrails on both sides of the stairs and use them. Fix handrails that are broken or loose. Make sure that handrails are as long as the stairways. ?Check any carpeting to make sure that it is firmly attached to the stairs. Fix any carpet that is loose or worn. ?Avoid having throw rugs at the top or bottom of the stairs. If you do have throw rugs,  attach them to the floor with carpet tape. ?Make sure that you have a light switch at the top of the stairs and the bottom of the stairs. If you do not have them, ask someone to add them for you. ?What else can I do to help prevent falls? ?Wear shoes that: ?Do not have high heels. ?Have rubber bottoms. ?Are comfortable and fit you well. ?Are closed at the toe. Do not wear sandals. ?If you use a stepladder: ?Make sure that it is fully opened. Do not climb a closed stepladder. ?Make sure that both sides of the stepladder are locked into place. ?Ask someone to hold it for you, if possible. ?Clearly mark and make sure that you can see: ?Any grab bars or handrails. ?First and last steps. ?Where the edge of  each step is. ?Use tools that help you move around (mobility aids) if they are needed. These include: ?Canes. ?Walkers. ?Scooters. ?Crutches. ?Turn on the lights when you go into a dark area. Replace any light

## 2021-11-21 ENCOUNTER — Other Ambulatory Visit: Payer: Self-pay | Admitting: Specialist

## 2021-11-21 NOTE — H&P (Signed)
PREOPERATIVE H&P  Chief Complaint: G56.01 Carpal tunnel syndrome, right upper limb  HPI: Kathleen Tran is a 81 y.o. female who presents for preoperative history and physical with a diagnosis of G56.01 Carpal tunnel syndrome, right upper limb. Symptoms are rated as moderate to severe, and have been worsening.  This is significantly impairing activities of daily living.  She has elected for surgical management.   Past Medical History:  Diagnosis Date   Arthritis    Diabetes mellitus without complication (HCC)    Dyspnea    GERD (gastroesophageal reflux disease)    Hepatitis    at age 30   History of herniated intervertebral disc    lower back   History of hiatal hernia    Hyperlipidemia    Hypertension    Tremors of nervous system    HEAD   Past Surgical History:  Procedure Laterality Date   APPENDECTOMY     CATARACT EXTRACTION W/PHACO Left 09/11/2018   Procedure: CATARACT EXTRACTION PHACO AND INTRAOCULAR LENS PLACEMENT (IOC) LEFT, DIABETIC;  Surgeon: Leandrew Koyanagi, MD;  Location: [facility] ORS;  Service: Ophthalmology;  Laterality: Left;  [place]  01:03 AP% 7.9 CDE 6.89 Fluid pack lot # [id number]   CATARACT EXTRACTION W/PHACO Right 02/11/2019   Procedure: CATARACT EXTRACTION PHACO AND INTRAOCULAR LENS PLACEMENT (IOC) RIGHT, DIABETIC;  Surgeon: Leandrew Koyanagi, MD;  Location: [facility] ORS;  Service: Ophthalmology;  Laterality: Right;  [place] 00:51 AP% 11.6 CDE 8.20 fluid pack lot # [id number]   COLONOSCOPY WITH PROPOFOL N/A 09/26/2017   Procedure: COLONOSCOPY WITH PROPOFOL;  Surgeon: Virgel Manifold, MD;  Location: [facility] ENDOSCOPY;  Service: Endoscopy;  Laterality: N/A;   COLONOSCOPY WITH PROPOFOL N/A 10/16/2019   Procedure: COLONOSCOPY WITH PROPOFOL;  Surgeon: Lucilla Lame, MD;  Location: [facility] ENDOSCOPY;  Service: Endoscopy;  Laterality: N/A;   CYST EXCISION     "base of spine"   EYE SURGERY Left    cataract extraction   JOINT REPLACEMENT Left    hip replacement   PARTIAL HIP  ARTHROPLASTY Right    TONSILLECTOMY     TOTAL HIP ARTHROPLASTY Left 01/08/2019   Procedure: TOTAL HIP ARTHROPLASTY ANTERIOR APPROACH;  Surgeon: Lovell Sheehan, MD;  Location: [facility] ORS;  Service: Orthopedics;  Laterality: Left;   Social History   Socioeconomic History   Marital status: Married    Spouse name: [name] ...son   Number of children: 6   Years of education: Not on file   Highest education level: GED or equivalent  Occupational History   Occupation: retired  Tobacco Use   Smoking status: Never   Smokeless tobacco: Never  Vaping Use   Vaping Use: Never used  Substance and Sexual Activity   Alcohol use: No   Drug use: No   Sexual activity: Not on file  Other Topics Concern   Not on file  Social History Narrative   Not on file   Social Determinants of Health   Financial Resource Strain: Low Risk    Difficulty of Paying Living Expenses: Not hard at all  Food Insecurity: No Food Insecurity   Worried About Charity fundraiser in the Last Year: Never true   [place] in the Last Year: Never true  Transportation Needs: Unknown   Lack of Transportation (Medical): Not on file   Lack of Transportation (Non-Medical): No  Physical Activity: Insufficiently Active   Days of Exercise per Week: 2 days   Minutes of Exercise per Session: 20 min  Stress: No Stress Concern  Present   Feeling of Stress : Not at all  Social Connections: Moderately Integrated   Frequency of Communication with Friends and Family: Once a week   Frequency of Social Gatherings with Friends and Family: Never   Attends Religious Services: More than 4 times per year   Active Member of [employer] or Organizations: Yes   Attends Music therapist: More than 4 times per year   Marital Status: Married   No family history on file. Allergies  Allergen Reactions   Citrus Other (See Comments)    Rash in mouth and on arms Rash in mouth and on arms   Prior to Admission medications   Medication  Sig Start Date End Date Taking? Authorizing Provider  acetaminophen (TYLENOL) 500 MG tablet Take 1,000 mg by mouth daily as needed for moderate pain or headache.    [provider]  aspirin 81 MG chewable tablet Chew 1 tablet (81 mg total) by mouth daily. (Not twice a day) 08/28/19   Mar Daring, PA-C  Blood Glucose Monitoring Suppl [facility] VERIO IQ SYSTEM) w/Device KIT Check blood sugar a.c.h.s. 05/05/21   [name], PA-C  Calcium Carb-Cholecalciferol (CALCIUM 1000 + D PO) Take 3 tablets by mouth daily before lunch.    [provider]  Continuous Blood Gluc Receiver (FREESTYLE LIBRE 14 DAY READER) DEVI Use to read sugars 10/26/21   Drubel, Ria Comment, PA-C  Continuous Blood Gluc Sensor (FREESTYLE LIBRE 2 SENSOR) MISC Change every 14 days 10/26/21   Drubel, Ria Comment, PA-C  gabapentin (NEURONTIN) 400 MG capsule Take 1 capsule (400 mg total) by mouth 3 (three) times daily. 07/21/21   Virginia Crews, MD  glipiZIDE (GLUCOTROL) 5 MG tablet TAKE 1/2 TABLET BY MOUTH EVERY DAY BEFORE SUPPER 10/26/21   Drubel, Ria Comment, PA-C  losartan (COZAAR) 50 MG tablet TAKE 1 TABLET(50 MG) BY MOUTH EVERY EVENING 10/19/21   Drubel, Ria Comment, PA-C  meloxicam (MOBIC) 15 MG tablet Take 15 mg by mouth daily. 10/19/21   [provider]  metFORMIN (GLUCOPHAGE) 1000 MG tablet TAKE 1 TABLET(1000 MG) BY MOUTH TWICE DAILY WITH A MEAL 09/20/21   Drubel, Ria Comment, PA-C  Multiple Vitamins-Minerals (ADULT GUMMY PO) Take 2 tablets by mouth daily before lunch.    [provider]  Omega-3 Fatty Acids (FISH OIL PO) Take 2 capsules by mouth daily before lunch.     [provider]  omeprazole (PRILOSEC) 20 MG capsule Take 1 capsule (20 mg total) by mouth daily. 10/26/21   [name], PA-C  OneTouch Delica Lancets 23N MISC To check blood sugar once daily 08/03/20   Mar Daring, PA-C  [facility] VERIO test strip USE TO TEST BLOOD SUGAR DAILY 04/18/21   Virginia Crews, MD   pravastatin (PRAVACHOL) 80 MG tablet TAKE 1 TABLET(80 MG) BY MOUTH EVERY EVENING 11/04/21   Drubel, Ria Comment, PA-C  vitamin B-12 (CYANOCOBALAMIN) 1000 MCG tablet Take 1,000 mcg by mouth daily before lunch.    [provider]     Positive ROS: All other systems have been reviewed and were otherwise negative with the exception of those mentioned in the HPI and as above.  Physical Exam: General: Alert, no acute distress Cardiovascular: No pedal edema. Heart is regular and without murmur.  Respiratory: No cyanosis, no use of accessory musculature. Lungs are clear. GI: No organomegaly, abdomen is soft and non-tender Skin: No lesions in the area of chief complaint Neurologic: Sensation intact distally Psychiatric: Patient is competent for consent with normal mood and affect  Lymphatic: No axillary or cervical lymphadenopathy  MUSCULOSKELETAL: Positive MNC test right wrist. Decreased pinch and sensation. Skin intact. Grip good.   Assessment: G56.01 Carpal tunnel syndrome, right upper limb  Plan: Plan for Procedure(s): Right CARPAL TUNNEL RELEASE  The risks benefits and alternatives were discussed with the patient including but not limited to the risks of nonoperative treatment, versus surgical intervention including infection, bleeding, nerve injury,  blood clots, cardiopulmonary complications, morbidity, mortality, among others, and they were willing to proceed.   Park Breed, MD [phone]   11/21/2021 11:54 AM

## 2021-11-23 ENCOUNTER — Encounter
Admission: RE | Admit: 2021-11-23 | Discharge: 2021-11-23 | Disposition: A | Discharge status: Home / Self Care | Payer: Medicare Other | Source: Ambulatory Visit | Attending: Specialist | Admitting: Specialist

## 2021-11-23 DIAGNOSIS — Z01818 Encounter for other preprocedural examination: Secondary | ICD-10-CM

## 2021-11-23 HISTORY — DX: Age-related osteoporosis without current pathological fracture: M81.0

## 2021-11-23 HISTORY — DX: Edema, unspecified: R60.9

## 2021-11-23 HISTORY — DX: Localized edema: R60.0

## 2021-11-23 NOTE — Patient Instructions (Signed)
Your procedure is scheduled on:11-29-21 Tuesday Report to the Registration Desk on the 1st floor of the [facility].Then proceed to the 2nd floor Surgery Desk To find out your arrival time, please call [phone] between 1PM - 3PM on:11-25-21 Friday If your arrival time is 6:00 am, do not arrive prior to that time as the [facility] entrance doors do not open until 6:00 am.  REMEMBER: Instructions that are not followed completely may result in serious medical risk, up to and including death; or upon the discretion of your surgeon and anesthesiologist your surgery may need to be rescheduled.  Do not eat food OR drink any liquids after midnight the night before surgery.  No gum chewing, lozengers or hard candies.  TAKE THESE MEDICATIONS THE MORNING OF SURGERY WITH A SIP OF WATER: -omeprazole (PRILOSEC)-take one the night before and one on the morning of surgery - helps to prevent nausea after surgery.)  Stop your 81 mg Aspirin NOW (11-23-21)  One week prior to surgery: Stop Anti-inflammatories (NSAIDS) such as Meloxicam (Mobic), Advil, Aleve, Ibuprofen, Motrin, Naproxen, Naprosyn and Aspirin based products such as Excedrin, Goodys Powder, BC Powder.You may however, take Tylenol if needed for pain up until the day of surgery.  Stop ANY OVER THE COUNTER supplements/vitamins NOW (11-22-21) until after surgery (Calcium+Vitamin D, Multivitamin, Fish Oil, Vitamin B12)  No Alcohol for 24 hours before or after surgery.  No Smoking including e-cigarettes for 24 hours prior to surgery.  No chewable tobacco products for at least 6 hours prior to surgery.  No nicotine patches on the day of surgery.  Do not use any "recreational" drugs for at least a week prior to your surgery.  Please be advised that the combination of cocaine and anesthesia may have negative outcomes, up to and including death. If you test positive for cocaine, your surgery will be cancelled.  On the morning of surgery brush your  teeth with toothpaste and water, you may rinse your mouth with mouthwash if you wish. Do not swallow any toothpaste or mouthwash.  Use CHG Soap as directed on instruction sheet.  Do not wear jewelry, make-up, hairpins, clips or nail polish.  Do not wear lotions, powders, or perfumes.   Do not shave body from the neck down 48 hours prior to surgery just in case you cut yourself which could leave a site for infection.  Also, freshly shaved skin may become irritated if using the CHG soap.  Contact lenses, hearing aids and dentures may not be worn into surgery.  Do not bring valuables to the hospital. [facility] is not responsible for any missing/lost belongings or valuables.   Notify your doctor if there is any change in your medical condition (cold, fever, infection).  Wear comfortable clothing (specific to your surgery type) to the hospital.  After surgery, you can help prevent lung complications by doing breathing exercises.  Take deep breaths and cough every 1-2 hours. Your doctor may order a device called an Incentive Spirometer to help you take deep breaths. When coughing or sneezing, hold a pillow firmly against your incision with both hands. This is called "splinting." Doing this helps protect your incision. It also decreases belly discomfort.  If you are being admitted to the hospital overnight, leave your suitcase in the car. After surgery it may be brought to your room.  If you are being discharged the day of surgery, you will not be allowed to drive home. You will need a responsible adult (18 years or  older) to drive you home and stay with you that night.   If you are taking public transportation, you will need to have a responsible adult (18 years or older) with you. Please confirm with your physician that it is acceptable to use public transportation.   Please call the [facility] Dept. at [phone] if you have any questions about these  instructions.  Surgery Visitation Policy:  Patients undergoing a surgery or procedure may have two family members or support persons with them as long as the person is not COVID-19 positive or experiencing its symptoms.

## 2021-11-29 ENCOUNTER — Encounter
Admission: RE | Disposition: A | Discharge status: Home / Self Care | Payer: Self-pay | Source: Home / Self Care | Attending: Specialist

## 2021-11-29 ENCOUNTER — Ambulatory Visit: Payer: Medicare Other | Admitting: Anesthesiology

## 2021-11-29 ENCOUNTER — Other Ambulatory Visit: Payer: Self-pay

## 2021-11-29 ENCOUNTER — Encounter: Payer: Self-pay | Admitting: Specialist

## 2021-11-29 ENCOUNTER — Ambulatory Visit
Admission: RE | Admit: 2021-11-29 | Discharge: 2021-11-29 | Disposition: A | Discharge status: Home / Self Care | Payer: Medicare Other | Attending: Specialist | Admitting: Specialist

## 2021-11-29 DIAGNOSIS — G5601 Carpal tunnel syndrome, right upper limb: Secondary | ICD-10-CM | POA: Insufficient documentation

## 2021-11-29 DIAGNOSIS — G709 Myoneural disorder, unspecified: Secondary | ICD-10-CM | POA: Insufficient documentation

## 2021-11-29 DIAGNOSIS — E1151 Type 2 diabetes mellitus with diabetic peripheral angiopathy without gangrene: Secondary | ICD-10-CM | POA: Insufficient documentation

## 2021-11-29 DIAGNOSIS — K759 Inflammatory liver disease, unspecified: Secondary | ICD-10-CM | POA: Insufficient documentation

## 2021-11-29 DIAGNOSIS — Z01818 Encounter for other preprocedural examination: Secondary | ICD-10-CM

## 2021-11-29 DIAGNOSIS — K219 Gastro-esophageal reflux disease without esophagitis: Secondary | ICD-10-CM | POA: Diagnosis not present

## 2021-11-29 DIAGNOSIS — I10 Essential (primary) hypertension: Secondary | ICD-10-CM | POA: Insufficient documentation

## 2021-11-29 HISTORY — PX: BILATERAL CARPAL TUNNEL RELEASE: SHX6508

## 2021-11-29 LAB — GLUCOSE, CAPILLARY
Glucose-Capillary: 149 mg/dL — ABNORMAL HIGH (ref 70–99)
Glucose-Capillary: 184 mg/dL — ABNORMAL HIGH (ref 70–99)

## 2021-11-29 SURGERY — BILATERAL CARPAL TUNNEL RELEASE
Anesthesia: General | Site: Hand | Laterality: Right

## 2021-11-29 MED ORDER — LIDOCAINE HCL (PF) 2 % IJ SOLN
INTRAMUSCULAR | Status: AC
  Filled 2021-11-29: qty 5

## 2021-11-29 MED ORDER — BUPIVACAINE HCL (PF) 0.5 % IJ SOLN
INTRAMUSCULAR | Status: AC
  Filled 2021-11-29: qty 30

## 2021-11-29 MED ORDER — CHLORHEXIDINE GLUCONATE 0.12 % MT SOLN: 15.0000 mL | Freq: Once | OROMUCOSAL | Status: AC

## 2021-11-29 MED ORDER — PROPOFOL 10 MG/ML IV BOLUS
INTRAVENOUS | Status: DC | PRN
  Administered 2021-11-29: 110 mg via INTRAVENOUS

## 2021-11-29 MED ORDER — MELOXICAM 15 MG PO TABS: 15.0000 mg | ORAL_TABLET | Freq: Every day | ORAL | 3 refills | Status: AC

## 2021-11-29 MED ORDER — GABAPENTIN 300 MG PO CAPS
ORAL_CAPSULE | ORAL | Status: AC
  Administered 2021-11-29: 300 mg via ORAL
  Filled 2021-11-29: qty 1

## 2021-11-29 MED ORDER — GABAPENTIN 300 MG PO CAPS: 300.0000 mg | ORAL_CAPSULE | ORAL | Status: AC

## 2021-11-29 MED ORDER — HYDROCODONE-ACETAMINOPHEN 5-325 MG PO TABS: 1.0000 | ORAL_TABLET | Freq: Four times a day (QID) | ORAL | 0 refills | Status: DC | PRN

## 2021-11-29 MED ORDER — 0.9 % SODIUM CHLORIDE (POUR BTL) OPTIME
TOPICAL | Status: DC | PRN
  Administered 2021-11-29: 500 mL

## 2021-11-29 MED ORDER — ONDANSETRON HCL 4 MG/2ML IJ SOLN
INTRAMUSCULAR | Status: DC | PRN
  Administered 2021-11-29: 4 mg via INTRAVENOUS

## 2021-11-29 MED ORDER — ORAL CARE MOUTH RINSE: 15.0000 mL | Freq: Once | OROMUCOSAL | Status: AC

## 2021-11-29 MED ORDER — CHLORHEXIDINE GLUCONATE 0.12 % MT SOLN
OROMUCOSAL | Status: AC
  Administered 2021-11-29: 15 mL via OROMUCOSAL
  Filled 2021-11-29: qty 15

## 2021-11-29 MED ORDER — DEXAMETHASONE SODIUM PHOSPHATE 10 MG/ML IJ SOLN
INTRAMUSCULAR | Status: AC
  Filled 2021-11-29: qty 1

## 2021-11-29 MED ORDER — CEFAZOLIN SODIUM-DEXTROSE 2-4 GM/100ML-% IV SOLN
2.0000 g | INTRAVENOUS | Status: AC
  Administered 2021-11-29: 2 g via INTRAVENOUS

## 2021-11-29 MED ORDER — DEXAMETHASONE SODIUM PHOSPHATE 10 MG/ML IJ SOLN
INTRAMUSCULAR | Status: DC | PRN
  Administered 2021-11-29: 5 mg via INTRAVENOUS

## 2021-11-29 MED ORDER — ACETAMINOPHEN 10 MG/ML IV SOLN
INTRAVENOUS | Status: DC | PRN
  Administered 2021-11-29: 1000 mg via INTRAVENOUS

## 2021-11-29 MED ORDER — MELOXICAM 7.5 MG PO TABS
ORAL_TABLET | ORAL | Status: AC
  Administered 2021-11-29: 15 mg via ORAL
  Filled 2021-11-29: qty 2

## 2021-11-29 MED ORDER — GABAPENTIN 400 MG PO CAPS: 400.0000 mg | ORAL_CAPSULE | Freq: Three times a day (TID) | ORAL | 3 refills | Status: DC

## 2021-11-29 MED ORDER — ONDANSETRON HCL 4 MG/2ML IJ SOLN: 4.0000 mg | Freq: Once | INTRAMUSCULAR | Status: DC | PRN

## 2021-11-29 MED ORDER — MELOXICAM 7.5 MG PO TABS
15.0000 mg | ORAL_TABLET | ORAL | Status: AC
Start: 2021-11-29 — End: 2021-11-29

## 2021-11-29 MED ORDER — BUPIVACAINE HCL 0.5 % IJ SOLN
INTRAMUSCULAR | Status: DC | PRN
  Administered 2021-11-29: 17 mL

## 2021-11-29 MED ORDER — ACETAMINOPHEN 10 MG/ML IV SOLN
INTRAVENOUS | Status: AC
  Filled 2021-11-29: qty 100

## 2021-11-29 MED ORDER — FENTANYL CITRATE (PF) 100 MCG/2ML IJ SOLN: 25.0000 ug | INTRAMUSCULAR | Status: DC | PRN

## 2021-11-29 MED ORDER — ONDANSETRON HCL 4 MG/2ML IJ SOLN
INTRAMUSCULAR | Status: AC
  Filled 2021-11-29: qty 2

## 2021-11-29 MED ORDER — SODIUM CHLORIDE 0.9 % IV SOLN
INTRAVENOUS | Status: DC
Start: 2021-11-29 — End: 2021-11-29

## 2021-11-29 MED ORDER — LIDOCAINE HCL (CARDIAC) PF 100 MG/5ML IV SOSY
PREFILLED_SYRINGE | INTRAVENOUS | Status: DC | PRN
  Administered 2021-11-29: 80 mg via INTRAVENOUS

## 2021-11-29 MED ORDER — FENTANYL CITRATE (PF) 100 MCG/2ML IJ SOLN
INTRAMUSCULAR | Status: AC
  Filled 2021-11-29: qty 2

## 2021-11-29 MED ORDER — FENTANYL CITRATE (PF) 100 MCG/2ML IJ SOLN
INTRAMUSCULAR | Status: DC | PRN
  Administered 2021-11-29 (×4): 25 ug via INTRAVENOUS

## 2021-11-29 MED ORDER — CEFAZOLIN SODIUM-DEXTROSE 2-4 GM/100ML-% IV SOLN
INTRAVENOUS | Status: AC
  Filled 2021-11-29: qty 100

## 2021-11-29 MED ORDER — CHLORHEXIDINE GLUCONATE CLOTH 2 % EX PADS: 6.0000 | MEDICATED_PAD | Freq: Once | CUTANEOUS | Status: DC

## 2021-11-29 MED ORDER — PROPOFOL 10 MG/ML IV BOLUS
INTRAVENOUS | Status: AC
  Filled 2021-11-29: qty 20

## 2021-11-29 SURGICAL SUPPLY — 33 items
BLADE SURG MINI STRL (BLADE) ×2 IMPLANT
BNDG ESMARK 4X12 TAN STRL LF (GAUZE/BANDAGES/DRESSINGS) ×2 IMPLANT
BNDG GAUZE DERMACEA FLUFF (GAUZE/BANDAGES/DRESSINGS) ×1
BNDG GAUZE DERMACEA FLUFF 4 (GAUZE/BANDAGES/DRESSINGS) IMPLANT
CHLORAPREP W/TINT 26 (MISCELLANEOUS) ×2 IMPLANT
CUFF TOURN SGL QUICK 18X4 (TOURNIQUET CUFF) IMPLANT
DRSG GAUZE FLUFF 36X18 (GAUZE/BANDAGES/DRESSINGS) ×4 IMPLANT
ELECT REM PT RETURN 9FT ADLT (ELECTROSURGICAL) ×2
ELECTRODE REM PT RTRN 9FT ADLT (ELECTROSURGICAL) ×1 IMPLANT
GAUZE XEROFORM 1X8 LF (GAUZE/BANDAGES/DRESSINGS) ×2 IMPLANT
GLOVE BIO SURGEON STRL SZ8 (GLOVE) ×2 IMPLANT
GOWN STRL REUS W/ TWL LRG LVL3 (GOWN DISPOSABLE) ×1 IMPLANT
GOWN STRL REUS W/TWL LRG LVL3 (GOWN DISPOSABLE) ×1
GOWN STRL REUS W/TWL LRG LVL4 (GOWN DISPOSABLE) ×2 IMPLANT
KIT TURNOVER KIT A (KITS) ×2 IMPLANT
MANIFOLD NEPTUNE II (INSTRUMENTS) ×2 IMPLANT
NS IRRIG 500ML POUR BTL (IV SOLUTION) ×2 IMPLANT
PACK EXTREMITY ARMC (MISCELLANEOUS) ×2 IMPLANT
PAD CAST 4YDX4 CTTN HI CHSV (CAST SUPPLIES) IMPLANT
PAD PREP 24X41 OB/GYN DISP (PERSONAL CARE ITEMS) ×2 IMPLANT
PADDING CAST 4IN STRL (MISCELLANEOUS) ×1
PADDING CAST BLEND 4X4 STRL (MISCELLANEOUS) ×1 IMPLANT
PADDING CAST COTTON 4X4 STRL (CAST SUPPLIES) ×1
SPLINT CAST 1 STEP 3X12 (MISCELLANEOUS) ×2 IMPLANT
STOCKINETTE 48X4 2 PLY STRL (GAUZE/BANDAGES/DRESSINGS) ×1 IMPLANT
STOCKINETTE BIAS CUT 4 980044 (GAUZE/BANDAGES/DRESSINGS) ×2 IMPLANT
STOCKINETTE STRL 4IN 9604848 (GAUZE/BANDAGES/DRESSINGS) ×2 IMPLANT
STOCKINETTE STRL 6IN 960660 (GAUZE/BANDAGES/DRESSINGS) ×2 IMPLANT
SUT ETHILON 4-0 (SUTURE) ×1
SUT ETHILON 4-0 FS2 18XMFL BLK (SUTURE) ×1
SUT ETHILON 5-0 FS-2 18 BLK (SUTURE) ×2 IMPLANT
SUTURE ETHLN 4-0 FS2 18XMF BLK (SUTURE) ×1 IMPLANT
WATER STERILE IRR 500ML POUR (IV SOLUTION) ×2 IMPLANT

## 2021-11-29 NOTE — Anesthesia Preprocedure Evaluation (Addendum)
Anesthesia Evaluation  Patient identified by MRN, date of birth, ID band Patient awake    Reviewed: Allergy & Precautions, H&P , NPO status , Patient's Chart, lab work & pertinent test results, reviewed documented beta blocker date and time   Airway Mallampati: II  TM Distance: >3 FB Neck ROM: full    Dental  (+) Teeth Intact   Pulmonary neg pulmonary ROS,    Pulmonary exam normal        Cardiovascular Exercise Tolerance: Poor hypertension, On Medications + Peripheral Vascular Disease  Normal cardiovascular exam Rate:Normal     Neuro/Psych  Neuromuscular disease negative psych ROS   GI/Hepatic hiatal hernia, GERD  Medicated,(+) Hepatitis -  Endo/Other  negative endocrine ROSdiabetes  Renal/GU negative Renal ROS  negative genitourinary   Musculoskeletal  (+) Arthritis ,   Abdominal   Peds  Hematology negative hematology ROS (+)   Anesthesia Other Findings   Reproductive/Obstetrics negative OB ROS                            Anesthesia Physical Anesthesia Plan  ASA: 3  Anesthesia Plan: General ETT   Post-op Pain Management:    Induction:   PONV Risk Score and Plan: 3  Airway Management Planned:   Additional Equipment:   Intra-op Plan:   Post-operative Plan:   Informed Consent: I have reviewed the patients History and Physical, chart, labs and discussed the procedure including the risks, benefits and alternatives for the proposed anesthesia with the patient or authorized representative who has indicated his/her understanding and acceptance.       Plan Discussed with: CRNA  Anesthesia Plan Comments:         Anesthesia Quick Evaluation

## 2021-11-29 NOTE — Op Note (Signed)
11/29/2021  10:46 AM  PATIENT:  Kathleen Tran    PRE-OPERATIVE DIAGNOSIS: RIGHT CARPAL TUNNEL SYNDROME POST-OPERATIVE DIAGNOSIS: RIGHT CARPAL TUNNEL SYNDROME  PROCEDURE:  RIGHT CARPAL TUNNEL RELEASE  SURGEON: Park Breed, MD    ANESTHESIA:   General  TOURNIQUET TIME: 71   MIN  PREOPERATIVE INDICATIONS:  Kathleen Tran is a  81 y.o. female with a diagnosis of right carpal tunnel syndrome who failed conservative measures and elected for surgical management.    The risks benefits and alternatives were discussed with the patient preoperatively including but not limited to the risks of infection, bleeding, nerve injury, incomplete relief of symptoms, pillar pain, cardiopulmonary complications, the need for revision surgery, among others, and the patient was willing to proceed.  OPERATIVE FINDINGS: Thickened volar ligament and nerve compression.  OPERATIVE PROCEDURE: The patient is brought to the operating room placed in the supine position. General anesthesia was administered. The right upper extremity was prepped and draped in usual sterile fashion. Time out was performed. The arm was elevated and exsanguinated and the tourniquet was inflated. Incision was made in line with the radial border of the ring finger. The carpal tunnel transverse fascia was identified, cleaned, and incised sharply. The common sensory branches were visualized along with the superficial palmar arch and protected.  The median nerve was protected below  A Kelly clamp was  placed underneath the transverse carpal ligament, protecting the nerve. I released the ligament completely, and then released the proximal distal volar forearm fascia. The nerve was identified, and visualized, and protected throughout the case. The motor branch was intact upon inspection.  No masses or abnormalities were identified in ulnar bursa.  The wounds were irrigated copiously, and the skin closed with nylon. The wound was injected with 1/2%  marcaine followed by a sterile dressing and  volar splint .  Tourniquet was deflated with good return of blood flow to all fingers. Sponge and needle counts were correct.  The patient tolerated this well, with no complications. The patient was awakened and taken to recovery in good condition.

## 2021-11-29 NOTE — Anesthesia Procedure Notes (Signed)
Procedure Name: LMA Insertion Date/Time: 11/29/2021 10:00 AM Performed by: Jonna Clark, CRNA Pre-anesthesia Checklist: Patient identified, Patient being monitored, Timeout performed, Emergency Drugs available and Suction available Patient Re-evaluated:Patient Re-evaluated prior to induction Oxygen Delivery Method: Circle system utilized Preoxygenation: Pre-oxygenation with 100% oxygen Induction Type: IV induction Ventilation: Mask ventilation without difficulty LMA: LMA inserted LMA Size: 4.0 Tube type: Oral Number of attempts: 1 Placement Confirmation: positive ETCO2 and breath sounds checked- equal and bilateral Tube secured with: Tape Dental Injury: Teeth and Oropharynx as per pre-operative assessment  Comments: Placed by Everlene Other SRNA

## 2021-11-29 NOTE — Discharge Instructions (Addendum)
AMBULATORY SURGERY  DISCHARGE INSTRUCTIONS   The drugs that you were given will stay in your system until tomorrow so for the next 24 hours you should not:  Drive an automobile Make any legal decisions Drink any alcoholic beverage   You may resume regular meals tomorrow.  Today it is better to start with liquids and gradually work up to solid foods.  You may eat anything you prefer, but it is better to start with liquids, then soup and crackers, and gradually work up to solid foods.   Please notify your doctor immediately if you have any unusual bleeding, trouble breathing, redness and pain at the surgery site, drainage, fever, or pain not relieved by medication.    Additional Instructions:   Follow up with Dr. Sabra Heck as scheduled     Please contact your physician with any problems or Same Day Surgery at [phone], Monday through Friday 6 am to 4 pm, or [place] at [facility] number at [phone].

## 2021-11-29 NOTE — H&P (Signed)
THE PATIENT WAS SEEN PRIOR TO SURGERY TODAY.  HISTORY, ALLERGIES, HOME MEDICATIONS AND OPERATIVE PROCEDURE WERE REVIEWED. RISKS AND BENEFITS OF SURGERY DISCUSSED WITH PATIENT AGAIN.  NO CHANGES FROM INITIAL HISTORY AND PHYSICAL NOTED.    

## 2021-11-29 NOTE — Transfer of Care (Signed)
Immediate Anesthesia Transfer of Care Note  Patient: Kathleen Tran  Procedure(s) Performed: CARPAL TUNNEL RELEASE (Right: Hand)  Patient Location: PACU  Anesthesia Type:General  Level of Consciousness: drowsy  Airway & Oxygen Therapy: Patient Spontanous Breathing and Patient connected to face mask oxygen  Post-op Assessment: Report given to RN and Post -op Vital signs reviewed and stable  Post vital signs: Reviewed and stable  Last Vitals:  Vitals Value Taken Time  BP 132/61 11/29/21 1049  Temp    Pulse 68 11/29/21 1051  Resp 14 11/29/21 1051  SpO2 98 % 11/29/21 1051  Vitals shown include unvalidated device data.  Last Pain:  Vitals:   11/29/21 0811  TempSrc: Temporal  PainSc: 0-No pain         Complications: No notable events documented.

## 2021-11-30 ENCOUNTER — Telehealth: Payer: Self-pay

## 2021-11-30 ENCOUNTER — Encounter: Payer: Self-pay | Admitting: Specialist

## 2021-11-30 NOTE — Anesthesia Postprocedure Evaluation (Signed)
Anesthesia Post Note  Patient: Dentist  Procedure(s) Performed: CARPAL TUNNEL RELEASE (Right: Hand)  Patient location during evaluation: PACU Anesthesia Type: General Level of consciousness: awake and alert Pain management: pain level controlled Vital Signs Assessment: post-procedure vital signs reviewed and stable Respiratory status: spontaneous breathing, nonlabored ventilation, respiratory function stable and patient connected to nasal cannula oxygen Cardiovascular status: blood pressure returned to baseline and stable Postop Assessment: no apparent nausea or vomiting Anesthetic complications: no   No notable events documented.   Last Vitals:  Vitals:   11/29/21 1130 11/29/21 1147  BP: (!) 179/92 (!) 193/76  Pulse: 64 65  Resp: 18 16  Temp:  (!) 36.1 C  SpO2: 95% 95%    Last Pain:  Vitals:   11/29/21 1147  TempSrc: Temporal  PainSc: 0-No pain                 Molli Barrows

## 2021-11-30 NOTE — Telephone Encounter (Signed)
Copied from [place] [phone] [phone]. Topic: General - Other >> Nov 30, 2021  9:45 AM Bayard Beaver wrote: Reason for CRM:pt states requested a freestyle libre from appt she had a month ago with Dr Thedore Mins and hasnt heard anything back. Please call back

## 2021-12-01 NOTE — Telephone Encounter (Signed)
Kathleen Tran from [facility]. Is calling to follow up on PA for freestyle libre .  [phone] Fax- [phone]  Reference number - [id number]

## 2021-12-07 ENCOUNTER — Telehealth: Payer: Self-pay

## 2021-12-07 NOTE — Telephone Encounter (Signed)
Fax received and put in Lindsay's box

## 2021-12-07 NOTE — Telephone Encounter (Signed)
Copied from [place] [phone]. Topic: General - Other >> Dec 07, 2021 10:24 AM Pawlus, Brayton Layman A wrote: Reason for CRM: [employer] was following up on a fax that was sent over for an appeal request and office notes, please advise if this fax has been received.

## 2021-12-08 NOTE — Telephone Encounter (Signed)
[employer] calling to ask for documentation, OV notes, anything that will support the need for the [facility].  Fax  [place]  Cb  [phone]

## 2022-01-15 ENCOUNTER — Other Ambulatory Visit: Payer: Self-pay | Admitting: Family Medicine

## 2022-03-08 ENCOUNTER — Ambulatory Visit: Payer: Medicare Other | Admitting: Physician Assistant

## 2022-03-16 ENCOUNTER — Encounter: Payer: Self-pay | Admitting: Physician Assistant

## 2022-03-16 ENCOUNTER — Ambulatory Visit (INDEPENDENT_AMBULATORY_CARE_PROVIDER_SITE_OTHER): Payer: Medicare Other | Admitting: Physician Assistant

## 2022-03-16 VITALS — BP 111/53 | HR 55 | Wt 184.9 lb

## 2022-03-16 DIAGNOSIS — I152 Hypertension secondary to endocrine disorders: Secondary | ICD-10-CM

## 2022-03-16 DIAGNOSIS — R1310 Dysphagia, unspecified: Secondary | ICD-10-CM | POA: Diagnosis not present

## 2022-03-16 DIAGNOSIS — R609 Edema, unspecified: Secondary | ICD-10-CM | POA: Diagnosis not present

## 2022-03-16 DIAGNOSIS — E1159 Type 2 diabetes mellitus with other circulatory complications: Secondary | ICD-10-CM

## 2022-03-16 DIAGNOSIS — E1140 Type 2 diabetes mellitus with diabetic neuropathy, unspecified: Secondary | ICD-10-CM | POA: Diagnosis not present

## 2022-03-16 NOTE — Assessment & Plan Note (Addendum)
A1c ordered, has been consistently <7% for the last few years Managed with metformin 1000 mg bid and glipizide 2.5 mg  Pt experiencing hypoglycemic episodes, will switch to jardiance 10 mg pending cmp D/t inc in diarrhea cut down metformin from 1000 mg bid to just once daily. Foot exam, uacr utd Reminded optho Vaccine discussion F/u 3 mo and will contact with recs based on a1c/cmp

## 2022-03-16 NOTE — Assessment & Plan Note (Addendum)
Managed with losartan 50 mg  Hypotensive but asymptomatic today Usually well controlled thinking dehydration, advised increase fluids and f/u next week to check bp

## 2022-03-16 NOTE — Progress Notes (Signed)
I,Sha'taria Tyson,acting as a Education administrator for [employer], PA-C.,have documented all relevant documentation on the behalf of [name], PA-C,as directed by  [name], PA-C while in the presence of [name], PA-C.   Established patient visit   Patient: Kathleen Tran   DOB: 10-18-1940   81 y.o. Female  MRN: [id number] Visit Date: 03/16/2022  Today's healthcare provider: [name], PA-C   Cc. Dm ii f/u, concerns  Subjective    HPI  Pt reports increased diarrhea in the AM after breakfast. Cannot usually leave house in AM until subsides.   Pt reports difficulty swallowing, food getting 'stuck' over the last few years. Never a concern to her until recently--occurring more often. Reports happens more with pills, solid food. Only happened 1-2 times with liquids. Denies new cough, SOB.  Pt reports increase in leg swelling for the last 1-2 months. She is not sure if it comes and goes or if it is there all the time. Reports it is associated with some discomfort. Does not keep her feet up and does not wear compression socks/stockings.  Diabetes Mellitus Type II, Follow-up  Lab Results  Component Value Date   HGBA1C 6.8 (H) 10/26/2021   HGBA1C 6.6 (A) 04/25/2021   HGBA1C 6.6 (H) 09/30/2020   Wt Readings from Last 3 Encounters:  03/16/22 184 lb 14.4 oz (83.9 kg)  11/29/21 190 lb (86.2 kg)  11/07/21 190 lb (86.2 kg)   Last seen for diabetes 4 months ago.  Management since then includes continue current meds well controlled no episodes of hypoglycemia. She reports excellent compliance with treatment. She is not having side effects.  Symptoms: No fatigue No foot ulcerations  Yes appetite changes No nausea  Yes paresthesia of the feet  Yes polydipsia  No polyuria Yes visual disturbances   No vomiting     Home blood sugar records: fasting range: Low 120's-160  Episodes of hypoglycemia? Yes hot,sweaty, headaches, light headed--will check but unsure how low she  has gone; unsure how often, usually happens in the evening after dinner   Current insulin regiment: none Most Recent Eye Exam: unsure Current exercise: none Current diet habits: well balanced, diabetic  Pertinent Labs: Lab Results  Component Value Date   CHOL 176 10/26/2021   HDL 70 10/26/2021   LDLCALC 82 10/26/2021   TRIG 142 10/26/2021   CHOLHDL 2.5 10/26/2021   Lab Results  Component Value Date   NA 144 10/26/2021   K 5.1 10/26/2021   CREATININE 0.61 10/26/2021   EGFR 91 10/26/2021   MICROALBUR 20 07/16/2015   LABMICR <3.0 10/26/2021     ---------------------------------------------------------------------------------------------------   Medications: Outpatient Medications Prior to Visit  Medication Sig   acetaminophen (TYLENOL) 500 MG tablet Take 1,000 mg by mouth daily as needed for moderate pain or headache.   aspirin 81 MG chewable tablet Chew 81 mg by mouth daily.   Blood Glucose Monitoring Suppl (ONETOUCH VERIO IQ SYSTEM) w/Device KIT Check blood sugar a.c.h.s.   Calcium Carb-Cholecalciferol (CALCIUM 1000 + D PO) Take 1 tablet by mouth daily.   Continuous Blood Gluc Sensor (FREESTYLE LIBRE 2 SENSOR) MISC Change every 14 days   gabapentin (NEURONTIN) 400 MG capsule Take 1 capsule (400 mg total) by mouth 3 (three) times daily.   gabapentin (NEURONTIN) 400 MG capsule TAKE 1 CAPSULE(400 MG) BY MOUTH THREE TIMES DAILY   glipiZIDE (GLUCOTROL) 5 MG tablet TAKE 1/2 TABLET BY MOUTH EVERY DAY BEFORE SUPPER   HYDROcodone-acetaminophen (NORCO) 5-325 MG tablet Take  1-2 tablets by mouth every 6 (six) hours as needed.   losartan (COZAAR) 50 MG tablet TAKE 1 TABLET(50 MG) BY MOUTH EVERY EVENING (Patient taking differently: Take 50 mg by mouth every evening. TAKE 1 TABLET(50 MG) BY MOUTH EVERY EVENING)   meloxicam (MOBIC) 15 MG tablet Take 15 mg by mouth daily.   meloxicam (MOBIC) 15 MG tablet Take 1 tablet (15 mg total) by mouth daily.   metFORMIN (GLUCOPHAGE) 1000 MG tablet TAKE  1 TABLET(1000 MG) BY MOUTH TWICE DAILY WITH A MEAL   Multiple Vitamins-Minerals (ADULT GUMMY PO) Take 2 tablets by mouth daily before lunch.   Omega-3 Fatty Acids (FISH OIL PO) Take 1 capsule by mouth daily before lunch.   omeprazole (PRILOSEC) 20 MG capsule Take 20 mg by mouth every morning.   OneTouch Delica Lancets 84O MISC To check blood sugar once daily   ONETOUCH VERIO test strip USE TO TEST BLOOD SUGAR DAILY   pravastatin (PRAVACHOL) 80 MG tablet TAKE 1 TABLET(80 MG) BY MOUTH EVERY EVENING   vitamin B-12 (CYANOCOBALAMIN) 1000 MCG tablet Take 1,000 mcg by mouth daily before lunch.   [DISCONTINUED] Continuous Blood Gluc Receiver (FREESTYLE LIBRE 14 DAY READER) DEVI Use to read sugars (Patient not taking: Reported on 03/16/2022)   No facility-administered medications prior to visit.    Review of Systems  Constitutional:  Negative for fatigue and fever.  Respiratory:  Negative for cough and shortness of breath.   Cardiovascular:  Negative for chest pain and leg swelling.  Gastrointestinal:  Negative for abdominal pain.  Neurological:  Negative for dizziness and headaches.      Objective    Blood pressure (!) 111/53, pulse (!) 55, weight 184 lb 14.4 oz (83.9 kg), SpO2 98 %.   Physical Exam Constitutional:      General: She is awake.     Appearance: She is well-developed.  HENT:     Head: Normocephalic.  Eyes:     Conjunctiva/sclera: Conjunctivae normal.  Cardiovascular:     Rate and Rhythm: Normal rate and regular rhythm.     Heart sounds: Normal heart sounds.  Pulmonary:     Effort: Pulmonary effort is normal.     Breath sounds: Normal breath sounds.  Musculoskeletal:     Comments: Non pitting mild edema b/l lower extremities L>R. LLE with a slight pink hue not warm to touch pt reports tender to palpation.  Skin:    General: Skin is warm.  Neurological:     Mental Status: She is alert and oriented to person, place, and time.  Psychiatric:        Attention and  Perception: Attention normal.        Mood and Affect: Mood normal.        Speech: Speech normal.        Behavior: Behavior is cooperative.      No results found for any visits on 03/16/22.  Assessment & Plan     Problem List Items Addressed This Visit       Cardiovascular and Mediastinum   Hypertension associated with diabetes ([place])    Managed with losartan 50 mg  Hypotensive but asymptomatic today Usually well controlled thinking dehydration, advised increase fluids and f/u next week to check bp         Digestive   Dysphagia    Pt reported  Gave swallowing safety advise Ref to GI for eval      Relevant Orders   Ambulatory referral to Gastroenterology  Endocrine   Type 2 diabetes mellitus with diabetic neuropathy, without long-term current use of insulin (HCC) - Primary    A1c ordered, has been consistently <7% for the last few years Managed with metformin 1000 mg bid and glipizide 2.5 mg  Pt experiencing hypoglycemic episodes, will switch to jardiance 10 mg pending cmp D/t inc in diarrhea cut down metformin from 1000 mg bid to just once daily. Foot exam, uacr utd Reminded optho Vaccine discussion F/u 3 mo and will contact with recs based on a1c/cmp      Relevant Orders   HgB A1c   Comprehensive Metabolic Panel (CMET)     Other   Peripheral edema    Mild and nonpitting Advised pt to monitor her LLE d/t pinkish hue does not appear cellulitic today Advised compression stocking and elevation of legs, Dash diet. Will check cmp/bnp       Relevant Orders   B Nat Peptide     Return in about 1 week (around 03/23/2022) for hypertension.      I, Lindsay Drubel, PA-C have reviewed all documentation for this visit. The documentation on  03/16/2022 for the exam, diagnosis, procedures, and orders are all accurate and complete.  Lindsay Drubel, PA-C [facility] [address] [place], [place], [address] Office: [phone] Fax:  [phone]   [facility]  

## 2022-03-16 NOTE — Assessment & Plan Note (Signed)
Pt reported  Gave swallowing safety advise Ref to GI for eval

## 2022-03-16 NOTE — Assessment & Plan Note (Signed)
Mild and nonpitting Advised pt to monitor her LLE d/t pinkish hue does not appear cellulitic today Advised compression stocking and elevation of legs, Dash diet. Will check cmp/bnp

## 2022-03-17 ENCOUNTER — Telehealth: Payer: Self-pay

## 2022-03-17 ENCOUNTER — Other Ambulatory Visit: Payer: Self-pay | Admitting: Physician Assistant

## 2022-03-17 DIAGNOSIS — E1140 Type 2 diabetes mellitus with diabetic neuropathy, unspecified: Secondary | ICD-10-CM

## 2022-03-17 LAB — COMPREHENSIVE METABOLIC PANEL
ALT: 14 IU/L (ref 0–32)
AST: 18 IU/L (ref 0–40)
Albumin/Globulin Ratio: 1.7 (ref 1.2–2.2)
Albumin: 4 g/dL (ref 3.8–4.8)
Alkaline Phosphatase: 70 IU/L (ref 44–121)
BUN/Creatinine Ratio: 24 (ref 12–28)
BUN: 16 mg/dL (ref 8–27)
Bilirubin Total: 0.3 mg/dL (ref 0.0–1.2)
CO2: 24 mmol/L (ref 20–29)
Calcium: 9.7 mg/dL (ref 8.7–10.3)
Chloride: 107 mmol/L — ABNORMAL HIGH (ref 96–106)
Creatinine, Ser: 0.68 mg/dL (ref 0.57–1.00)
Globulin, Total: 2.3 g/dL (ref 1.5–4.5)
Glucose: 124 mg/dL — ABNORMAL HIGH (ref 70–99)
Potassium: 4.7 mmol/L (ref 3.5–5.2)
Sodium: 143 mmol/L (ref 134–144)
Total Protein: 6.3 g/dL (ref 6.0–8.5)
eGFR: 89 mL/min/{1.73_m2} (ref >59)

## 2022-03-17 LAB — HEMOGLOBIN A1C
Est. average glucose Bld gHb Est-mCnc: 140 mg/dL
Hgb A1c MFr Bld: 6.5 % — ABNORMAL HIGH (ref 4.8–5.6)

## 2022-03-17 LAB — BRAIN NATRIURETIC PEPTIDE: BNP: 39.3 pg/mL (ref 0.0–100.0)

## 2022-03-17 MED ORDER — EMPAGLIFLOZIN 10 MG PO TABS: 10.0000 mg | ORAL_TABLET | Freq: Every day | ORAL | 1 refills | Status: DC

## 2022-03-17 NOTE — Telephone Encounter (Signed)
Copied from [place] [phone]. Topic: General - Other >> Mar 17, 2022  5:20 PM Ja-Kwan M wrote: Reason for CRM: Pt stated she received a message to call back for lab results. Pt requests call back to go over lab results.

## 2022-03-22 ENCOUNTER — Encounter: Payer: Self-pay | Admitting: Gastroenterology

## 2022-03-22 ENCOUNTER — Ambulatory Visit: Payer: Medicare Other | Admitting: Physician Assistant

## 2022-03-23 ENCOUNTER — Encounter: Payer: Self-pay | Admitting: Physician Assistant

## 2022-03-23 ENCOUNTER — Ambulatory Visit (INDEPENDENT_AMBULATORY_CARE_PROVIDER_SITE_OTHER): Payer: Medicare Other | Admitting: Physician Assistant

## 2022-03-23 DIAGNOSIS — E1159 Type 2 diabetes mellitus with other circulatory complications: Secondary | ICD-10-CM | POA: Diagnosis not present

## 2022-03-23 DIAGNOSIS — I152 Hypertension secondary to endocrine disorders: Secondary | ICD-10-CM | POA: Diagnosis not present

## 2022-03-23 MED ORDER — LOSARTAN POTASSIUM 50 MG PO TABS
25.0000 mg | ORAL_TABLET | Freq: Every day | ORAL | 3 refills | Status: DC
Start: 2022-03-23 — End: 2022-07-26

## 2022-03-23 NOTE — Progress Notes (Signed)
I,Sha'taria Tyson,acting as a Education administrator for [employer], PA-C.,have documented all relevant documentation on the behalf of [name], PA-C,as directed by  [name], PA-C while in the presence of [name], PA-C.   Established patient visit   Patient: Kathleen Tran   DOB: 22-Mar-1941   81 y.o. Female  MRN: [id number] Visit Date: 03/23/2022  Today's healthcare provider: [name], PA-C   Cc. Htn f/u  Subjective    HPI  Hypertension, follow-up  BP Readings from Last 3 Encounters:  03/23/22 (!) 120/53  03/16/22 (!) 111/53  11/29/21 (!) 193/76   Wt Readings from Last 3 Encounters:  03/16/22 184 lb 14.4 oz (83.9 kg)  11/29/21 190 lb (86.2 kg)  11/07/21 190 lb (86.2 kg)     She was last seen for hypertension 1 weeks ago.  BP at that visit was 111/53. Management since that visit includes hypotensive but asymptomatic today Usually well controlled thinking dehydration, advised increase fluids .  She reports excellent compliance with treatment. She is not having side effects. Not checking pressure at home.   Medications: Outpatient Medications Prior to Visit  Medication Sig   acetaminophen (TYLENOL) 500 MG tablet Take 1,000 mg by mouth daily as needed for moderate pain or headache.   aspirin 81 MG chewable tablet Chew 81 mg by mouth daily.   Blood Glucose Monitoring Suppl (ONETOUCH VERIO IQ SYSTEM) w/Device KIT Check blood sugar a.c.h.s.   Calcium Carb-Cholecalciferol (CALCIUM 1000 + D PO) Take 1 tablet by mouth daily.   Continuous Blood Gluc Sensor (FREESTYLE LIBRE 2 SENSOR) MISC Change every 14 days   empagliflozin (JARDIANCE) 10 MG TABS tablet Take 1 tablet (10 mg total) by mouth daily before breakfast.   gabapentin (NEURONTIN) 400 MG capsule Take 1 capsule (400 mg total) by mouth 3 (three) times daily.   gabapentin (NEURONTIN) 400 MG capsule TAKE 1 CAPSULE(400 MG) BY MOUTH THREE TIMES DAILY   HYDROcodone-acetaminophen (NORCO) 5-325 MG tablet Take 1-2  tablets by mouth every 6 (six) hours as needed.   meloxicam (MOBIC) 15 MG tablet Take 15 mg by mouth daily.   meloxicam (MOBIC) 15 MG tablet Take 1 tablet (15 mg total) by mouth daily.   metFORMIN (GLUCOPHAGE) 1000 MG tablet TAKE 1 TABLET(1000 MG) BY MOUTH TWICE DAILY WITH A MEAL   Multiple Vitamins-Minerals (ADULT GUMMY PO) Take 2 tablets by mouth daily before lunch.   Omega-3 Fatty Acids (FISH OIL PO) Take 1 capsule by mouth daily before lunch.   omeprazole (PRILOSEC) 20 MG capsule Take 20 mg by mouth every morning.   OneTouch Delica Lancets 13K MISC To check blood sugar once daily   ONETOUCH VERIO test strip USE TO TEST BLOOD SUGAR DAILY   pravastatin (PRAVACHOL) 80 MG tablet TAKE 1 TABLET(80 MG) BY MOUTH EVERY EVENING   vitamin B-12 (CYANOCOBALAMIN) 1000 MCG tablet Take 1,000 mcg by mouth daily before lunch.   [DISCONTINUED] losartan (COZAAR) 50 MG tablet TAKE 1 TABLET(50 MG) BY MOUTH EVERY EVENING (Patient taking differently: Take 50 mg by mouth every evening. TAKE 1 TABLET(50 MG) BY MOUTH EVERY EVENING)   No facility-administered medications prior to visit.    Review of Systems  Constitutional:  Negative for fatigue and fever.  Respiratory:  Negative for cough and shortness of breath.   Cardiovascular:  Negative for chest pain and leg swelling.  Gastrointestinal:  Negative for abdominal pain.  Neurological:  Negative for dizziness and headaches.       Objective    Blood  pressure (!) 120/53, pulse 63.   Physical Exam Vitals reviewed.  Constitutional:      Appearance: She is obese. She is not ill-appearing.  HENT:     Head: Normocephalic.  Eyes:     Conjunctiva/sclera: Conjunctivae normal.  Cardiovascular:     Rate and Rhythm: Normal rate.  Pulmonary:     Effort: Pulmonary effort is normal. No respiratory distress.  Neurological:     General: No focal deficit present.     Mental Status: She is alert and oriented to person, place, and time.  Psychiatric:        Mood  and Affect: Mood normal.        Behavior: Behavior normal.     No results found for any visits on 03/23/22.  Assessment & Plan     Problem List Items Addressed This Visit       Cardiovascular and Mediastinum   Hypertension associated with diabetes ([place])    Still concerned over hypotension, increase fluids, cut losartan 50 mg to 25 mg.  Fu 4 weeks      Relevant Medications   losartan (COZAAR) 50 MG tablet     Return in about 4 weeks (around 04/20/2022) for hypertension.     I, [name], PA-C have reviewed all documentation for this visit. The documentation on  03/23/2022 for the exam, diagnosis, procedures, and orders are all accurate and complete.  [name], PA-C [facility] [address] [place], [place], [address] Office: [phone] Fax: [place]

## 2022-03-23 NOTE — Assessment & Plan Note (Signed)
Still concerned over hypotension, increase fluids, cut losartan 50 mg to 25 mg.  Fu 4 weeks

## 2022-04-21 ENCOUNTER — Ambulatory Visit (INDEPENDENT_AMBULATORY_CARE_PROVIDER_SITE_OTHER): Payer: Medicare Other | Admitting: Physician Assistant

## 2022-04-21 ENCOUNTER — Encounter: Payer: Self-pay | Admitting: Physician Assistant

## 2022-04-21 VITALS — BP 135/59 | HR 58 | Wt 177.2 lb

## 2022-04-21 DIAGNOSIS — R609 Edema, unspecified: Secondary | ICD-10-CM | POA: Diagnosis not present

## 2022-04-21 DIAGNOSIS — E1159 Type 2 diabetes mellitus with other circulatory complications: Secondary | ICD-10-CM

## 2022-04-21 DIAGNOSIS — I152 Hypertension secondary to endocrine disorders: Secondary | ICD-10-CM

## 2022-04-21 NOTE — Assessment & Plan Note (Signed)
Now in appropriate range, continue w/ losartan 25 mg . F/u 6 mo

## 2022-04-21 NOTE — Assessment & Plan Note (Signed)
Again trace edema, pt wearing compression socks! Will continue to follow

## 2022-04-21 NOTE — Progress Notes (Signed)
   I,Sha'taria Tyson,acting as a scribe for Lindsay Drubel, PA-C.,have documented all relevant documentation on the behalf of Lindsay Drubel, PA-C,as directed by  Lindsay Drubel, PA-C while in the presence of Lindsay Drubel, PA-C.   Established patient visit   Patient: Kathleen Tran   DOB: 08/14/1940   81 y.o. Female  MRN: [id number] Visit Date: 04/21/2022  Today's healthcare provider: Lindsay Drubel, PA-C   Cc. Htn f/u  Subjective    HPI  Hypertension, follow-up  BP Readings from Last 3 Encounters:  04/21/22 (!) 135/59  03/23/22 (!) 120/53  03/16/22 (!) 111/53   Wt Readings from Last 3 Encounters:  04/21/22 177 lb 3.2 oz (80.4 kg)  03/16/22 184 lb 14.4 oz (83.9 kg)  11/29/21 190 lb (86.2 kg)     She was last seen for hypertension 4 weeks ago.  BP at that visit was 120/53. Management since that visit includes increase fluids, cut losartan 50 mg to 25 mg.  Fu 4 weeks.  She reports excellent compliance with treatment. She is not having side effects.  She is following a Low Sodium diet. She is exercising. She does not smoke.  Use of agents associated with hypertension: none.   Outside blood pressures are not being checked Symptoms: No chest pain No chest pressure  No palpitations No syncope  No dyspnea No orthopnea  No paroxysmal nocturnal dyspnea No lower extremity edema   Pertinent labs Lab Results  Component Value Date   CHOL 176 10/26/2021   HDL 70 10/26/2021   LDLCALC 82 10/26/2021   TRIG 142 10/26/2021   CHOLHDL 2.5 10/26/2021   Lab Results  Component Value Date   NA 143 03/16/2022   K 4.7 03/16/2022   CREATININE 0.68 03/16/2022   EGFR 89 03/16/2022   GLUCOSE 124 (H) 03/16/2022   TSH 2.150 10/26/2021     The 10-year ASCVD risk score (Arnett DK, et al., 2019) is: 57.1%  ---------------------------------------------------------------------------------------------------   Medications: Outpatient Medications Prior to Visit  Medication Sig    acetaminophen (TYLENOL) 500 MG tablet Take 1,000 mg by mouth daily as needed for moderate pain or headache.   aspirin 81 MG chewable tablet Chew 81 mg by mouth daily.   Blood Glucose Monitoring Suppl (ONETOUCH VERIO IQ SYSTEM) w/Device KIT Check blood sugar a.c.h.s.   Calcium Carb-Cholecalciferol (CALCIUM 1000 + D PO) Take 1 tablet by mouth daily.   Continuous Blood Gluc Sensor (FREESTYLE LIBRE 2 SENSOR) MISC Change every 14 days   empagliflozin (JARDIANCE) 10 MG TABS tablet Take 1 tablet (10 mg total) by mouth daily before breakfast.   gabapentin (NEURONTIN) 400 MG capsule Take 1 capsule (400 mg total) by mouth 3 (three) times daily.   gabapentin (NEURONTIN) 400 MG capsule TAKE 1 CAPSULE(400 MG) BY MOUTH THREE TIMES DAILY   HYDROcodone-acetaminophen (NORCO) 5-325 MG tablet Take 1-2 tablets by mouth every 6 (six) hours as needed.   losartan (COZAAR) 50 MG tablet Take 0.5 tablets (25 mg total) by mouth daily.   meloxicam (MOBIC) 15 MG tablet Take 15 mg by mouth daily.   meloxicam (MOBIC) 15 MG tablet Take 1 tablet (15 mg total) by mouth daily.   metFORMIN (GLUCOPHAGE) 1000 MG tablet TAKE 1 TABLET(1000 MG) BY MOUTH TWICE DAILY WITH A MEAL   Multiple Vitamins-Minerals (ADULT GUMMY PO) Take 2 tablets by mouth daily before lunch.   Omega-3 Fatty Acids (FISH OIL PO) Take 1 capsule by mouth daily before lunch.   omeprazole (PRILOSEC) 20 MG capsule Take 20 mg   by mouth every morning.   OneTouch Delica Lancets 63J MISC To check blood sugar once daily   ONETOUCH VERIO test strip USE TO TEST BLOOD SUGAR DAILY   pravastatin (PRAVACHOL) 80 MG tablet TAKE 1 TABLET(80 MG) BY MOUTH EVERY EVENING   vitamin B-12 (CYANOCOBALAMIN) 1000 MCG tablet Take 1,000 mcg by mouth daily before lunch.   No facility-administered medications prior to visit.     Objective    Blood pressure (!) 135/59, pulse (!) 58, weight 177 lb 3.2 oz (80.4 kg), SpO2 98 %.   Physical Exam Vitals reviewed.  Constitutional:       Appearance: She is not ill-appearing.  HENT:     Head: Normocephalic.  Eyes:     Conjunctiva/sclera: Conjunctivae normal.  Cardiovascular:     Rate and Rhythm: Normal rate.  Pulmonary:     Effort: Pulmonary effort is normal. No respiratory distress.  Musculoskeletal:     Comments: Trace edema b/l lower extremities   Neurological:     General: No focal deficit present.     Mental Status: She is alert and oriented to person, place, and time.  Psychiatric:        Mood and Affect: Mood normal.        Behavior: Behavior normal.      No results found for any visits on 04/21/22.  Assessment & Plan     Problem List Items Addressed This Visit       Cardiovascular and Mediastinum   Hypertension associated with diabetes ([place]) - Primary    Now in appropriate range, continue w/ losartan 25 mg . F/u 6 mo         Other   Peripheral edema    Again trace edema, pt wearing compression socks! Will continue to follow         Return in about 3 months (around 07/22/2022) for chronic conditions, DMII.      I, [name], PA-C have reviewed all documentation for this visit. The documentation on  04/21/2022 for the exam, diagnosis, procedures, and orders are all accurate and complete.  [name], PA-C [facility] [address] [place], [place], [address] Office: [phone] Fax: [place]

## 2022-04-30 ENCOUNTER — Other Ambulatory Visit: Payer: Self-pay | Admitting: Physician Assistant

## 2022-04-30 DIAGNOSIS — E7800 Pure hypercholesterolemia, unspecified: Secondary | ICD-10-CM

## 2022-05-05 ENCOUNTER — Other Ambulatory Visit: Payer: Self-pay | Admitting: Physician Assistant

## 2022-05-09 LAB — HM DIABETES EYE EXAM

## 2022-07-12 ENCOUNTER — Other Ambulatory Visit: Payer: Self-pay | Admitting: Family Medicine

## 2022-07-12 DIAGNOSIS — E119 Type 2 diabetes mellitus without complications: Secondary | ICD-10-CM

## 2022-07-12 NOTE — Telephone Encounter (Signed)
Requested Prescriptions  Pending Prescriptions Disp Refills   ONETOUCH VERIO test strip [Pharmacy Med Name: [place] TEST ST(NEW)100S] 100 strip 0    Sig: TEST BLOOD SUGAR DAILY     Endocrinology: Diabetes - Testing Supplies Passed - 07/12/2022  3:37 AM      Passed - Valid encounter within last 12 months    Recent Outpatient Visits           2 months ago Hypertension associated with diabetes [facility])   [facility] [name], PA-C   3 months ago Hypertension associated with diabetes [facility])   [facility] Thedore Mins, [place], PA-C   3 months ago Type 2 diabetes mellitus with diabetic neuropathy, without long-term current use of insulin ([place])   [facility] Thedore Mins, [place], PA-C   8 months ago Encounter for health maintenance examination   [employer], [place], PA-C   1 year ago Type 2 diabetes mellitus with vascular disease [facility])   [place], Dionne Bucy, MD       Future Appointments             In 2 weeks Thedore Mins, Ria Comment, PA-C [facility]

## 2022-07-26 ENCOUNTER — Ambulatory Visit (INDEPENDENT_AMBULATORY_CARE_PROVIDER_SITE_OTHER): Payer: Medicare Other | Admitting: Physician Assistant

## 2022-07-26 ENCOUNTER — Encounter: Payer: Self-pay | Admitting: Physician Assistant

## 2022-07-26 VITALS — BP 113/59 | HR 55 | Temp 97.6°F | Ht 59.0 in | Wt 173.0 lb

## 2022-07-26 DIAGNOSIS — E1159 Type 2 diabetes mellitus with other circulatory complications: Secondary | ICD-10-CM

## 2022-07-26 DIAGNOSIS — M25511 Pain in right shoulder: Secondary | ICD-10-CM

## 2022-07-26 DIAGNOSIS — E1140 Type 2 diabetes mellitus with diabetic neuropathy, unspecified: Secondary | ICD-10-CM

## 2022-07-26 DIAGNOSIS — I152 Hypertension secondary to endocrine disorders: Secondary | ICD-10-CM

## 2022-07-26 HISTORY — DX: Pain in right shoulder: M25.511

## 2022-07-26 LAB — POCT GLYCOSYLATED HEMOGLOBIN (HGB A1C): Hemoglobin A1C: 7.4 % — AB (ref 4.0–5.6)

## 2022-07-26 MED ORDER — LOSARTAN POTASSIUM 25 MG PO TABS: 25.0000 mg | ORAL_TABLET | Freq: Every day | ORAL | 1 refills | Status: DC

## 2022-07-26 MED ORDER — GABAPENTIN 400 MG PO CAPS: ORAL_CAPSULE | ORAL | 1 refills | Status: DC

## 2022-07-26 NOTE — Progress Notes (Signed)
I,Sha'taria Tyson,acting as a Education administrator for [employer], PA-C.,have documented all relevant documentation on the behalf of Kathleen Kirschner, PA-C,as directed by  Kathleen Kirschner, PA-C while in the presence of Kathleen Kirschner, PA-C.   Established patient visit   Patient: Kathleen Tran   DOB: 09-06-1940   82 y.o. Female  MRN: [id number] Visit Date: 07/26/2022  Today's healthcare provider: [name], PA-C   Cc. DMII f/u, right arm pain  Subjective    HPI  Patient reports right arm and shoulder pain intermittently for the past few weeks.  Denies numbness denies change in grip strength but reports that when she is picking things up sometimes she will drop them.  This does not happen every day she will occasionally take a Tylenol which relieves her symptoms.  Patient reports some dizziness usually when she is in the kitchen making dinner last about 15 minutes and is relieved with eating something sweet.  Patient does not check blood sugar at this time.  Diabetes Mellitus Type II, Follow-up  Lab Results  Component Value Date   HGBA1C 6.5 (H) 03/16/2022   HGBA1C 6.8 (H) 10/26/2021   HGBA1C 6.6 (A) 04/25/2021   Wt Readings from Last 3 Encounters:  07/26/22 173 lb (78.5 kg)  04/21/22 177 lb 3.2 oz (80.4 kg)  03/16/22 184 lb 14.4 oz (83.9 kg)   Last seen for diabetes 4 months ago.  Management since then includes switch to jardiance 10 mg pending cmp D/t inc in diarrhea cut down metformin from 1000 mg bid to just once daily. She reports excellent compliance with treatment. She is not having side effects.  Symptoms: No fatigue No foot ulcerations  No appetite changes No nausea  No paresthesia of the feet  No polydipsia  No polyuria No visual disturbances   No vomiting     Home blood sugar records: 126-150 fasting   Episodes of hypoglycemia? Reports some lightheadache   Current insulin regiment: none Most Recent Eye Exam: 05/09/22 Pertinent Labs: Lab Results  Component  Value Date   CHOL 176 10/26/2021   HDL 70 10/26/2021   LDLCALC 82 10/26/2021   TRIG 142 10/26/2021   CHOLHDL 2.5 10/26/2021   Lab Results  Component Value Date   NA 143 03/16/2022   K 4.7 03/16/2022   CREATININE 0.68 03/16/2022   EGFR 89 03/16/2022   LABMICR <3.0 10/26/2021   MICRALBCREAT <5 10/26/2021     ---------------------------------------------------------------------------------------------------  Medications: Outpatient Medications Prior to Visit  Medication Sig   acetaminophen (TYLENOL) 500 MG tablet Take 1,000 mg by mouth daily as needed for moderate pain or headache.   aspirin 81 MG chewable tablet Chew 81 mg by mouth daily.   Blood Glucose Monitoring Suppl (ONETOUCH VERIO IQ SYSTEM) w/Device KIT Check blood sugar a.c.h.s.   Calcium Carb-Cholecalciferol (CALCIUM 1000 + D PO) Take 1 tablet by mouth daily.   Continuous Blood Gluc Sensor (FREESTYLE LIBRE 2 SENSOR) MISC Change every 14 days   empagliflozin (JARDIANCE) 10 MG TABS tablet Take 1 tablet (10 mg total) by mouth daily before breakfast.   glucose blood (ONETOUCH VERIO) test strip TEST BLOOD SUGAR DAILY   HYDROcodone-acetaminophen (NORCO) 5-325 MG tablet Take 1-2 tablets by mouth every 6 (six) hours as needed.   meloxicam (MOBIC) 15 MG tablet Take 15 mg by mouth daily.   meloxicam (MOBIC) 15 MG tablet Take 1 tablet (15 mg total) by mouth daily.   metFORMIN (GLUCOPHAGE) 1000 MG tablet TAKE 1 TABLET(1000 MG) BY MOUTH TWICE DAILY  WITH A MEAL   Multiple Vitamins-Minerals (ADULT GUMMY PO) Take 2 tablets by mouth daily before lunch.   Omega-3 Fatty Acids (FISH OIL PO) Take 1 capsule by mouth daily before lunch.   omeprazole (PRILOSEC) 20 MG capsule TAKE 1 CAPSULE(20 MG) BY MOUTH DAILY   OneTouch Delica Lancets 00P MISC To check blood sugar once daily   pravastatin (PRAVACHOL) 80 MG tablet TAKE 1 TABLET(80 MG) BY MOUTH EVERY EVENING   vitamin B-12 (CYANOCOBALAMIN) 1000 MCG tablet Take 1,000 mcg by mouth daily before  lunch.   [DISCONTINUED] gabapentin (NEURONTIN) 400 MG capsule Take 1 capsule (400 mg total) by mouth 3 (three) times daily.   [DISCONTINUED] gabapentin (NEURONTIN) 400 MG capsule TAKE 1 CAPSULE(400 MG) BY MOUTH THREE TIMES DAILY   [DISCONTINUED] losartan (COZAAR) 50 MG tablet Take 0.5 tablets (25 mg total) by mouth daily.   No facility-administered medications prior to visit.       Objective    BP (!) 113/59 (BP Location: Right Arm, Patient Position: Sitting, Cuff Size: Normal)   Pulse (!) 55   Temp 97.6 F (36.4 C)   Ht '4\' 11"'$  (1.499 m)   Wt 173 lb (78.5 kg)   SpO2 98%   BMI 34.94 kg/m   Physical Exam Constitutional:      General: She is awake.     Appearance: She is well-developed.  HENT:     Head: Normocephalic.  Eyes:     Conjunctiva/sclera: Conjunctivae normal.  Cardiovascular:     Rate and Rhythm: Normal rate and regular rhythm.     Heart sounds: Normal heart sounds.  Pulmonary:     Effort: Pulmonary effort is normal.     Breath sounds: Normal breath sounds.  Skin:    General: Skin is warm.  Neurological:     Mental Status: She is alert and oriented to person, place, and time.     Comments: Grip strength 5/5 right hand  Psychiatric:        Attention and Perception: Attention normal.        Mood and Affect: Mood normal.        Speech: Speech normal.        Behavior: Behavior is cooperative.     No results found for any visits on 07/26/22.  Assessment & Plan     Problem List Items Addressed This Visit       Cardiovascular and Mediastinum   Hypertension associated with diabetes ([place])    Well controlled without orthostatic symptoms Continue losartan 25 mg  F/u 6 mo       Relevant Medications   losartan (COZAAR) 25 MG tablet     Endocrine   Type 2 diabetes mellitus with diabetic neuropathy, without long-term current use of insulin ([facility]) - Primary    A1c today 7.4%, significant increase considering she has been <7% for several years Managed with  metformin 1000 mg BID and jardiance 10 mg. -- since last visit we switched from glipizide 2.5 to the jardiance to help with hypoglycemic events.  Advised continuing with current meds at current doses and rechecking A1c in 4 mo  If still elevated will increase jardiance dose Foot exam, uacr, utd On statin , arb F/u 4 mo      Relevant Medications   gabapentin (NEURONTIN) 400 MG capsule   losartan (COZAAR) 25 MG tablet   Other Relevant Orders   POCT glycosylated hemoglobin (Hb A1C)     Other   Acute pain of right shoulder  Recommending ice/ heat/stretching Any worsening of pain or increase in frequency recommend f/u with ortho Grip strength 5/5 right hand        Return in about 4 months (around 11/24/2022) for DMII.      I, Kathleen Kirschner, PA-C have reviewed all documentation for this visit. The documentation on  07/26/22  for the exam, diagnosis, procedures, and orders are all accurate and complete.  Kathleen Kirschner, PA-C [facility] [address] [place], [place], [address] Office: [phone] Fax: [place]

## 2022-07-26 NOTE — Assessment & Plan Note (Signed)
Well controlled without orthostatic symptoms Continue losartan 25 mg  F/u 6 mo

## 2022-07-26 NOTE — Assessment & Plan Note (Addendum)
Recommending ice/ heat/stretching Any worsening of pain or increase in frequency recommend f/u with ortho Grip strength 5/5 right hand

## 2022-07-26 NOTE — Assessment & Plan Note (Signed)
A1c today 7.4%, significant increase considering she has been <7% for several years Managed with metformin 1000 mg BID and jardiance 10 mg. -- since last visit we switched from glipizide 2.5 to the jardiance to help with hypoglycemic events.  Advised continuing with current meds at current doses and rechecking A1c in 4 mo  If still elevated will increase jardiance dose Foot exam, uacr, utd On statin , arb F/u 4 mo

## 2022-08-08 ENCOUNTER — Ambulatory Visit: Payer: Medicare Other | Admitting: Gastroenterology

## 2022-08-08 VITALS — BP 131/74 | HR 71 | Temp 97.9°F | Ht 61.0 in | Wt 170.0 lb

## 2022-08-08 DIAGNOSIS — R1310 Dysphagia, unspecified: Secondary | ICD-10-CM

## 2022-08-08 NOTE — Progress Notes (Signed)
Gastroenterology Consultation  Referring Provider:     [name], PA-C Primary Care Physician:  [name], PA-C Primary Gastroenterologist:  Dr. Allen Norris     Reason for Consultation:     Dysphagia        HPI:   Kathleen Tran is a 82 y.o. y/o female referred for consultation & management of dysphagia by Dr. [name], PA-C.  This patient comes in today with a history of dysphagia.  The patient reports that the dysphagia has been present for the last year.  She denies any unexplained weight loss due to the dysphagia.  She states that dysphagia is worse to more solids than it is to liquids.  There is no report of any food getting stuck in the esophagus where she is unable to bring it up.  There is also no report of any previous issues with swallowing prior to this.  She did have a colonoscopy by Dr. Bonna Gains back in 2019. The patient denies being on any blood thinners except for a daily aspirin.  Past Medical History:  Diagnosis Date   Arthritis    Diabetes mellitus without complication (HCC)    GERD (gastroesophageal reflux disease)    Hepatitis    at age 36   History of herniated intervertebral disc    lower back   History of hiatal hernia    Hyperlipidemia    Hypertension    Osteoporosis    Peripheral edema    Tremors of nervous system    HEAD    Past Surgical History:  Procedure Laterality Date   APPENDECTOMY     BILATERAL CARPAL TUNNEL RELEASE Right 11/29/2021   Procedure: CARPAL TUNNEL RELEASE;  Surgeon: Earnestine Leys, MD;  Location: [facility] ORS;  Service: Orthopedics;  Laterality: Right;   CATARACT EXTRACTION W/PHACO Left 09/11/2018   Procedure: CATARACT EXTRACTION PHACO AND INTRAOCULAR LENS PLACEMENT (IOC) LEFT, DIABETIC;  Surgeon: Leandrew Koyanagi, MD;  Location: [facility];  Service: Ophthalmology;  Laterality: Left;  [place]  01:03 AP% 7.9 CDE 6.89 Fluid pack lot # [id number]   CATARACT EXTRACTION W/PHACO Right 02/11/2019   Procedure: CATARACT EXTRACTION  PHACO AND INTRAOCULAR LENS PLACEMENT (IOC) RIGHT, DIABETIC;  Surgeon: Leandrew Koyanagi, MD;  Location: [facility];  Service: Ophthalmology;  Laterality: Right;  [place] 00:51 AP% 11.6 CDE 8.20 fluid pack lot # [id number]   COLONOSCOPY WITH PROPOFOL N/A 09/26/2017   Procedure: COLONOSCOPY WITH PROPOFOL;  Surgeon: Virgel Manifold, MD;  Location: [facility] ENDOSCOPY;  Service: Endoscopy;  Laterality: N/A;   COLONOSCOPY WITH PROPOFOL N/A 10/16/2019   Procedure: COLONOSCOPY WITH PROPOFOL;  Surgeon: Lucilla Lame, MD;  Location: [facility] ENDOSCOPY;  Service: Endoscopy;  Laterality: N/A;   CYST EXCISION     "base of spine"   EYE SURGERY Left    cataract extraction   PARTIAL HIP ARTHROPLASTY Right    TONSILLECTOMY     TOTAL HIP ARTHROPLASTY Left 01/08/2019   Procedure: TOTAL HIP ARTHROPLASTY ANTERIOR APPROACH;  Surgeon: Lovell Sheehan, MD;  Location: [facility];  Service: Orthopedics;  Laterality: Left;    Prior to Admission medications   Medication Sig Start Date End Date Taking? Authorizing Provider  acetaminophen (TYLENOL) 500 MG tablet Take 1,000 mg by mouth daily as needed for moderate pain or headache.   Yes [provider]  aspirin 81 MG chewable tablet Chew 81 mg by mouth daily. 08/28/19  Yes Mar Daring, PA-C  Blood Glucose Monitoring Suppl (ONETOUCH VERIO IQ SYSTEM) w/Device KIT Check blood sugar a.c.h.s. 05/05/21  Yes [name], PA-C  Calcium Carb-Cholecalciferol (CALCIUM 1000 + D PO) Take 1 tablet by mouth daily.   Yes [provider]  empagliflozin (JARDIANCE) 10 MG TABS tablet Take 1 tablet (10 mg total) by mouth daily before breakfast. 03/17/22  Yes Drubel, Ria Comment, PA-C  gabapentin (NEURONTIN) 400 MG capsule TAKE 1 CAPSULE(400 MG) BY MOUTH THREE TIMES DAILY 07/26/22  Yes Drubel, Ria Comment, PA-C  glucose blood (ONETOUCH VERIO) test strip TEST BLOOD SUGAR DAILY 07/12/22  Yes Bacigalupo, Dionne Bucy, MD  losartan (COZAAR) 25 MG tablet Take 1 tablet (25 mg total) by mouth  daily. 07/26/22  Yes Drubel, Ria Comment, PA-C  meloxicam (MOBIC) 15 MG tablet Take 1 tablet (15 mg total) by mouth daily. 11/29/21  Yes Earnestine Leys, MD  metFORMIN (GLUCOPHAGE) 1000 MG tablet TAKE 1 TABLET(1000 MG) BY MOUTH TWICE DAILY WITH A MEAL 09/20/21  Yes Drubel, Ria Comment, PA-C  Multiple Vitamins-Minerals (ADULT GUMMY PO) Take 2 tablets by mouth daily before lunch.   Yes [provider]  omeprazole (PRILOSEC) 20 MG capsule TAKE 1 CAPSULE(20 MG) BY MOUTH DAILY 05/05/22  Yes Drubel, Ria Comment, PA-C  OneTouch Delica Lancets 53Z MISC To check blood sugar once daily 08/03/20  Yes [name] M, PA-C  pravastatin (PRAVACHOL) 80 MG tablet TAKE 1 TABLET(80 MG) BY MOUTH EVERY EVENING 05/01/22  Yes Drubel, Ria Comment, PA-C  vitamin B-12 (CYANOCOBALAMIN) 1000 MCG tablet Take 1,000 mcg by mouth daily before lunch.   Yes [provider]    No family history on file.   Social History   Tobacco Use   Smoking status: Never   Smokeless tobacco: Never  Vaping Use   Vaping Use: Never used  Substance Use Topics   Alcohol use: No   Drug use: No    Allergies as of 08/08/2022 - Review Complete 08/08/2022  Allergen Reaction Noted   Citrus Other (See Comments) 09/26/2017    Review of Systems:    All systems reviewed and negative except where noted in HPI.   Physical Exam:  BP 131/74 (BP Location: Left Arm, Patient Position: Sitting, Cuff Size: Large)   Pulse 71   Temp 97.9 F (36.6 C) (Oral)   Ht '5\' 1"'$  (1.549 m)   Wt 170 lb (77.1 kg)   BMI 32.12 kg/m  No LMP recorded. Patient is postmenopausal. General:   Alert,  Well-developed, well-nourished, pleasant and cooperative in NAD Head:  Normocephalic and atraumatic. Eyes:  Sclera clear, no icterus.   Conjunctiva pink. Ears:  Normal auditory acuity. Neck:  Supple; no masses or thyromegaly. Lungs:  Respirations even and unlabored.  Clear throughout to auscultation.   No wheezes, crackles, or rhonchi. No acute distress. Heart:   Regular rate and rhythm; no murmurs, clicks, rubs, or gallops. Abdomen:  Normal bowel sounds.  No bruits.  Soft, non-tender and non-distended without masses, hepatosplenomegaly or hernias noted.  No guarding or rebound tenderness.  Negative Carnett sign.   Rectal:  Deferred.  Pulses:  Normal pulses noted. Extremities:  No clubbing or edema.  No cyanosis. Neurologic:  Alert and oriented x3;  grossly normal neurologically. Skin:  Intact without significant lesions or rashes.  No jaundice. Lymph Nodes:  No significant cervical adenopathy. Psych:  Alert and cooperative. Normal mood and affect.  Imaging Studies: No results found.  Assessment and Plan:   Kathleen Tran is a 82 y.o. y/o female comes in today with a history of dysphagia.  The dysphagia is more to solids than it is to liquids.  The patient will  be set up for an EGD to rule out any esophageal masses or strictures.  The patient has been explained the plan and agrees with it.    Lucilla Lame, MD. Marval Regal    Note: This dictation was prepared with Dragon dictation along with smaller phrase technology. Any transcriptional errors that result from this process are unintentional.

## 2022-08-23 ENCOUNTER — Other Ambulatory Visit: Payer: Self-pay | Admitting: Physician Assistant

## 2022-08-23 DIAGNOSIS — E1140 Type 2 diabetes mellitus with diabetic neuropathy, unspecified: Secondary | ICD-10-CM

## 2022-08-24 ENCOUNTER — Other Ambulatory Visit: Payer: Self-pay | Admitting: Physician Assistant

## 2022-08-24 DIAGNOSIS — E1140 Type 2 diabetes mellitus with diabetic neuropathy, unspecified: Secondary | ICD-10-CM

## 2022-08-28 ENCOUNTER — Telehealth: Payer: Self-pay | Admitting: Physician Assistant

## 2022-08-28 NOTE — Telephone Encounter (Signed)
Contacted Kathleen Tran to schedule their annual wellness visit. Appointment made for 11/13/2022.  [place] Direct Dial: [phone]

## 2022-09-13 ENCOUNTER — Other Ambulatory Visit: Payer: Self-pay | Admitting: Physician Assistant

## 2022-09-13 DIAGNOSIS — E1140 Type 2 diabetes mellitus with diabetic neuropathy, unspecified: Secondary | ICD-10-CM

## 2022-09-19 ENCOUNTER — Telehealth: Payer: Self-pay | Admitting: Gastroenterology

## 2022-09-19 NOTE — Telephone Encounter (Signed)
Pt called to cancel procedure for 09/28/2022 would like call back

## 2022-09-20 NOTE — Telephone Encounter (Signed)
Procedure canceled

## 2022-09-28 ENCOUNTER — Ambulatory Visit: Admission: RE | Admit: 2022-09-28 | Payer: Medicare Other | Source: Home / Self Care | Admitting: Gastroenterology

## 2022-09-28 ENCOUNTER — Encounter: Admission: RE | Payer: Self-pay | Source: Home / Self Care

## 2022-09-28 SURGERY — ESOPHAGOGASTRODUODENOSCOPY (EGD) WITH PROPOFOL
Anesthesia: General

## 2022-10-11 NOTE — Telephone Encounter (Signed)
error 

## 2022-10-27 ENCOUNTER — Other Ambulatory Visit: Payer: Self-pay | Admitting: Physician Assistant

## 2022-10-27 DIAGNOSIS — E7800 Pure hypercholesterolemia, unspecified: Secondary | ICD-10-CM

## 2022-11-13 ENCOUNTER — Ambulatory Visit (INDEPENDENT_AMBULATORY_CARE_PROVIDER_SITE_OTHER): Payer: Medicare Other

## 2022-11-13 VITALS — Ht 61.0 in | Wt 170.0 lb

## 2022-11-13 DIAGNOSIS — Z0000 Encounter for general adult medical examination without abnormal findings: Secondary | ICD-10-CM | POA: Diagnosis not present

## 2022-11-13 NOTE — Progress Notes (Addendum)
I connected with  Kathleen Tran on 11/13/22 by a audio enabled telemedicine application and verified that I am speaking with the correct person using two identifiers.  Patient Location: Home  Provider Location: Office/Clinic  I discussed the limitations of evaluation and management by telemedicine. The patient expressed understanding and agreed to proceed.  Subjective:   Kathleen Tran is a 82 y.o. female who presents for Medicare Annual (Subsequent) preventive examination.  Review of Systems    Cardiac Risk Factors include: advanced age (>44men, >63 women);diabetes mellitus;dyslipidemia;hypertension;sedentary lifestyle;obesity (BMI >30kg/m2)     Objective:    Today's Vitals   11/09/22 2203 11/13/22 0957  Weight:  170 lb (77.1 kg)  Height:  5\' 1"  (1.549 m)  PainSc: 3     Body mass index is 32.12 kg/m.     11/13/2022   10:08 AM 11/29/2021    8:12 AM 11/23/2021   10:03 AM 11/07/2021    9:19 AM 09/06/2020   11:13 AM 10/16/2019   12:42 PM 08/14/2019   11:09 AM  Advanced Directives  Does Patient Have a Medical Advance Directive? Yes Yes Yes No Yes Yes Yes  Type of Advance Directive Living will Living will Living will  Healthcare Power of [place];Living will Out of facility DNR (pink MOST or yellow form) Healthcare Power of [place];Living will  Copy of Healthcare Power of Attorney in Chart?     Yes - validated most recent copy scanned in chart (See row information)  No - copy requested  Would patient like information on creating a medical advance directive?    No - Patient declined       Current Medications (verified) Outpatient Encounter Medications as of 11/13/2022  Medication Sig   acetaminophen (TYLENOL) 500 MG tablet Take 1,000 mg by mouth daily as needed for moderate pain or headache.   aspirin 81 MG chewable tablet Chew 81 mg by mouth daily.   Blood Glucose Monitoring Suppl (ONETOUCH VERIO IQ SYSTEM) w/Device KIT Check blood sugar a.c.h.s.   Calcium Carb-Cholecalciferol  (CALCIUM 1000 + D PO) Take 1 tablet by mouth daily.   gabapentin (NEURONTIN) 400 MG capsule TAKE 1 CAPSULE(400 MG) BY MOUTH THREE TIMES DAILY   glucose blood (ONETOUCH VERIO) test strip TEST BLOOD SUGAR DAILY   JARDIANCE 10 MG TABS tablet TAKE 1 TABLET(10 MG) BY MOUTH DAILY BEFORE BREAKFAST   losartan (COZAAR) 25 MG tablet Take 1 tablet (25 mg total) by mouth daily.   meloxicam (MOBIC) 15 MG tablet Take 1 tablet (15 mg total) by mouth daily.   metFORMIN (GLUCOPHAGE) 1000 MG tablet TAKE 1 TABLET(1000 MG) BY MOUTH TWICE DAILY WITH A MEAL   Multiple Vitamins-Minerals (ADULT GUMMY PO) Take 2 tablets by mouth daily before lunch.   omeprazole (PRILOSEC) 20 MG capsule TAKE 1 CAPSULE(20 MG) BY MOUTH DAILY   OneTouch Delica Lancets 33G MISC To check blood sugar once daily   pravastatin (PRAVACHOL) 80 MG tablet TAKE 1 TABLET(80 MG) BY MOUTH EVERY EVENING   vitamin B-12 (CYANOCOBALAMIN) 1000 MCG tablet Take 1,000 mcg by mouth daily before lunch.   No facility-administered encounter medications on file as of 11/13/2022.    Allergies (verified) Citrus   History: Past Medical History:  Diagnosis Date   Allergy don't know   I was a teen ager.   Arthritis    Cataract    Diabetes mellitus without complication (HCC)    GERD (gastroesophageal reflux disease)    Hepatitis    at age 16   History of herniated intervertebral  disc    lower back   History of hiatal hernia    Hyperlipidemia    Hypertension    Osteoporosis    Peripheral edema    Tremors of nervous system    HEAD   Past Surgical History:  Procedure Laterality Date   APPENDECTOMY     BILATERAL CARPAL TUNNEL RELEASE Right 11/29/2021   Procedure: CARPAL TUNNEL RELEASE;  Surgeon: Deeann Saint, MD;  Location: [facility];  Service: Orthopedics;  Laterality: Right;   CATARACT EXTRACTION W/PHACO Left 09/11/2018   Procedure: CATARACT EXTRACTION PHACO AND INTRAOCULAR LENS PLACEMENT (IOC) LEFT, DIABETIC;  Surgeon: Lockie Mola, MD;   Location: [facility] OR[facility];  Service: Ophthalmology;  Laterality: Left;  [place]  01:03 AP% 7.9 CDE 6.89 Fluid pack lot # [id number]   CATARACT EXTRACTION W/PHACO Right 02/11/2019   Procedure: CATARACT EXTRACTION PHACO AND INTRAOCULAR LENS PLACEMENT (IOC) RIGHT, DIABETIC;  Surgeon: Lockie Mola, MD;  Location: [facility];  Service: Ophthalmology;  Laterality: Right;  [place] 00:51 AP% 11.6 CDE 8.20 fluid pack lot # [id number]   COLONOSCOPY WITH PROPOFOL N/A 09/26/2017   Procedure: COLONOSCOPY WITH PROPOFOL;  Surgeon: Pasty Spillers, MD;  Location: [facility] ENDOSCOPY;  Service: Endoscopy;  Laterality: N/A;   COLONOSCOPY WITH PROPOFOL N/A 10/16/2019   Procedure: COLONOSCOPY WITH PROPOFOL;  Surgeon: Midge Minium, MD;  Location: [facility] ENDOSCOPY;  Service: Endoscopy;  Laterality: N/A;   CYST EXCISION     "base of spine"   EYE SURGERY Left    cataract extraction   JOINT REPLACEMENT     PARTIAL HIP ARTHROPLASTY Right    TONSILLECTOMY     TOTAL HIP ARTHROPLASTY Left 01/08/2019   Procedure: TOTAL HIP ARTHROPLASTY ANTERIOR APPROACH;  Surgeon: Lyndle Herrlich, MD;  Location: [facility] OR[facility];  Service: Orthopedics;  Laterality: Left;   TUBAL LIGATION     Family History  Problem Relation Age of Onset   Early death Mother    Heart disease Maternal Grandfather    Cancer Maternal Grandmother    Asthma Daughter    Diabetes Son    Hypertension Sister    Varicose Veins Sister    Social History   Socioeconomic History   Marital status: Married    Spouse name: Kathleen Tran ...son   Number of children: 6   Years of education: Not on file   Highest education level: GED or equivalent  Occupational History   Occupation: retired  Tobacco Use   Smoking status: Never   Smokeless tobacco: Never  Vaping Use   Vaping Use: Never used  Substance and Sexual Activity   Alcohol use: No   Drug use: No   Sexual activity: Not Currently    Birth control/protection: None    Comment: Don't need it  Other Topics Concern   Not on  file  Social History Narrative   Not on file   Social Determinants of Health   Financial Resource Strain: Low Risk  (11/09/2022)   Overall Financial Resource Strain (CARDIA)    Difficulty of Paying Living Expenses: Not very hard  Food Insecurity: No Food Insecurity (11/09/2022)   Hunger Vital Sign    Worried About Running Out of Food in the Last Year: Never true    Ran Out of Food in the Last Year: Never true  Transportation Needs: No Transportation Needs (11/09/2022)   PRAPARE - Administrator, Civil Service (Medical): No    Lack of Transportation (Non-Medical): No  Physical Activity: Insufficiently Active (11/09/2022)   Exercise Vital Sign  Days of Exercise per Week: 2 days    Minutes of Exercise per Session: 20 min  Stress: No Stress Concern Present (11/09/2022)   [employer] of Occupational Health - Occupational Stress Questionnaire    Feeling of Stress : Not at all  Social Connections: Unknown (11/09/2022)   Social Connection and Isolation Panel [NHANES]    Frequency of Communication with Friends and Family: Twice a week    Frequency of Social Gatherings with Friends and Family: Twice a week    Attends Religious Services: Not on Marketing executive or Organizations: Yes    Attends Engineer, structural: More than 4 times per year    Marital Status: Married    Tobacco Counseling Counseling given: Not Answered  Clinical Intake:  Pre-visit preparation completed: Yes  Pain : 0-10 Pain Score: 3  Pain Type: Chronic pain Pain Location: Arm Pain Orientation: Right, Upper Pain Descriptors / Indicators: Aching Pain Relieving Factors: Tylenol. heat  Pain Relieving Factors: Tylenol. heat  BMI - recorded: 32.12 Nutritional Risks: None Diabetes: Yes CBG done?: Yes (140 BS this am at pt home) CBG resulted in Enter/ Edit results?: No Did pt. bring in CBG monitor from home?: No  How often do you need to have someone help you when you read  instructions, pamphlets, or other written materials from your doctor or pharmacy?: 1 - Never  Diabetic?yes  Interpreter Needed?: No  Comments: lives with husband and son Information entered by :: B.Mehtaab Mayeda,LPN   Activities of Daily Living    11/09/2022   10:03 PM 04/21/2022    2:26 PM  In your present state of health, do you have any difficulty performing the following activities:  Hearing? 0 0  Vision? 1 0  Difficulty concentrating or making decisions? 1 0  Walking or climbing stairs? 0 0  Dressing or bathing? 0 0  Doing errands, shopping? 0 0  Preparing Food and eating ? N   Using the Toilet? N   In the past six months, have you accidently leaked urine? N   Do you have problems with loss of bowel control? N   Managing your Medications? N   Managing your Finances? N   Housekeeping or managing your Housekeeping? N     Patient Care Team: Alfredia Ferguson, PA-C as PCP - General (Physician Assistant) Lyndle Herrlich, MD as Consulting Physician (Orthopedic Surgery) [facility], [facility])  Indicate any recent Medical Services you may have received from other than [facility] providers in the past year (date may be approximate).     Assessment:   This is a routine wellness examination for [place].  Hearing/Vision screen Hearing Screening - Comments:: Adequate hearing Vision Screening - Comments:: Blurry vision in rt eye;lft eye adequate vision [facility]   Dietary issues and exercise activities discussed: Current Exercise Habits: The patient does not participate in regular exercise at present, Exercise limited by: orthopedic condition(s)   Goals Addressed             This Visit's Progress    DIET - REDUCE SUGAR INTAKE   On track    Recommend cutting out sweets in daily diet. Pt to avoid eating desserts to help aid in weight loss and help diabetes.         Depression Screen    11/13/2022   10:04 AM 07/26/2022    1:48 PM 04/21/2022    2:26 PM 11/07/2021     9:16 AM 10/26/2021  1:50 PM 04/25/2021    2:45 PM 09/06/2020   11:10 AM  PHQ 2/9 Scores  PHQ - 2 Score 0 0 0 0 0 0 0  PHQ- 9 Score  0 1 3 8 1      Fall Risk    11/09/2022   10:03 PM 07/26/2022    1:48 PM 04/21/2022    2:26 PM 11/07/2021    9:20 AM 10/26/2021    1:50 PM  Fall Risk   Falls in the past year? 0 0 0 0 0  Number falls in past yr: 0 0 0 0 0  Injury with Fall? 0 0 0 0 0  Risk for fall due to :   No Fall Risks No Fall Risks No Fall Risks  Follow up   Falls evaluation completed Falls evaluation completed     FALL RISK PREVENTION PERTAINING TO THE HOME:  Any stairs in or around the home? No  If so, are there any without handrails? No  Home free of loose throw rugs in walkways, pet beds, electrical cords, etc? Yes  Adequate lighting in your home to reduce risk of falls? Yes   ASSISTIVE DEVICES UTILIZED TO PREVENT FALLS:  Life alert? No  Use of a cane, walker or w/c? No  Grab bars in the bathroom? No  Shower chair or bench in shower? No  Elevated toilet seat or a handicapped toilet? No   Cognitive Function:        11/13/2022   10:10 AM  6CIT Screen  What Year? 0 points  What month? 0 points  What time? 0 points  Count back from 20 0 points  Months in reverse 4 points  Repeat phrase 0 points  Total Score 4 points    Immunizations Immunization History  Administered Date(s) Administered   Fluad Quad(high Dose 65+) 04/25/2021   Moderna Sars-Covid-2 Vaccination 08/15/2019, 09/12/2019, 05/23/2020   Pneumococcal Conjugate-13 05/19/2013   Pneumococcal Polysaccharide-23 01/05/2006, 05/03/2009   Td 08/19/2009   Tdap 08/19/2009   Zoster, Live 08/19/2009    TDAP status: Up to date  Flu Vaccine status: Up to date  Pneumococcal vaccine status: Up to date  Covid-19 vaccine status: Completed vaccines  Qualifies for Shingles Vaccine? Yes   Zostavax completed No   Shingrix Completed?: No.    Education has been provided regarding the importance of this vaccine.  Patient has been advised to call insurance company to determine out of pocket expense if they have not yet received this vaccine. Advised may also receive vaccine at local pharmacy or Health Dept. Verbalized acceptance and understanding.  Screening Tests Health Maintenance  Topic Date Due   Zoster Vaccines- Shingrix (1 of 2) Never done   DTaP/Tdap/Td (3 - Td or Tdap) 08/20/2019   COVID-19 Vaccine (4 - 2023-24 season) 03/03/2022   Diabetic kidney evaluation - Urine ACR  10/27/2022   FOOT EXAM  10/27/2022   HEMOGLOBIN A1C  01/24/2023   INFLUENZA VACCINE  02/01/2023   Diabetic kidney evaluation - eGFR measurement  03/17/2023   OPHTHALMOLOGY EXAM  05/10/2023   Medicare Annual Wellness (AWV)  11/13/2023   DEXA SCAN  11/20/2023   Pneumonia Vaccine 3+ Years old  Completed   HPV VACCINES  Aged Out   COLONOSCOPY (Pts 45-70yrs Insurance coverage will need to be confirmed)  Discontinued    Health Maintenance  Health Maintenance Due  Topic Date Due   Zoster Vaccines- Shingrix (1 of 2) Never done   DTaP/Tdap/Td (3 - Td or Tdap) 08/20/2019  COVID-19 Vaccine (4 - 2023-24 season) 03/03/2022   Diabetic kidney evaluation - Urine ACR  10/27/2022   FOOT EXAM  10/27/2022    Colorectal cancer screening: No longer required.   Mammogram status: No longer required due to age.  Bone Density status: Completed yes. Results reflect: Bone density results: OSTEOPOROSIS. Repeat every 3 years.  Lung Cancer Screening: (Low Dose CT Chest recommended if Age 62-80 years, 30 pack-year currently smoking OR have quit w/in 15years.) does not qualify.   Lung Cancer Screening Referral: no  Additional Screening:  Hepatitis C Screening: does not qualify; Completed yes  Vision Screening: Recommended annual ophthalmology exams for early detection of glaucoma and other disorders of the eye. Is the patient up to date with their annual eye exam?  Yes  Who is the provider or what is the name of the office in which  the patient attends annual eye exams? [facility] If pt is not established with a provider, would they like to be referred to a provider to establish care? No .   Dental Screening: Recommended annual dental exams for proper oral hygiene  Community Resource Referral / Chronic Care Management: CRR required this visit?  No   CCM required this visit?  No      Plan:     I have personally reviewed and noted the following in the patient's chart:   Medical and social history Use of alcohol, tobacco or illicit drugs  Current medications and supplements including opioid prescriptions. Patient is not currently taking opioid prescriptions. Functional ability and status Nutritional status Physical activity Advanced directives List of other physicians Hospitalizations, surgeries, and ER visits in previous 12 months Vitals Screenings to include cognitive, depression, and falls Referrals and appointments  In addition, I have reviewed and discussed with patient certain preventive protocols, quality metrics, and best practice recommendations. A written personalized care plan for preventive services as well as general preventive health recommendations were provided to patient.     Sue Lush, LPN   1/61/0960   Nurse Notes: The patient states she is doing well and has no concerns or questions at this time. She does relay her legs give out on her sometimes when walking but no pain. She also relayed in visit that she is having worsening problems with rt upper arm pain. She will talk with PCP at visit next week.

## 2022-11-13 NOTE — Patient Instructions (Addendum)
Kathleen Tran , Thank you for taking time to come for your Medicare Wellness Visit. I appreciate your ongoing commitment to your health goals. Please review the following plan we discussed and let me know if I can assist you in the future.   These are the goals we discussed:  Goals      DIET - EAT MORE FRUITS AND VEGETABLES     DIET - REDUCE SUGAR INTAKE     Recommend cutting out sweets in daily diet. Pt to avoid eating desserts to help aid in weight loss and help diabetes.          This is a list of the screening recommended for you and due dates:  Health Maintenance  Topic Date Due   Zoster (Shingles) Vaccine (1 of 2) Never done   DTaP/Tdap/Td vaccine (3 - Td or Tdap) 08/20/2019   COVID-19 Vaccine (4 - 2023-24 season) 03/03/2022   Yearly kidney health urinalysis for diabetes  10/27/2022   Complete foot exam   10/27/2022   Hemoglobin A1C  01/24/2023   Flu Shot  02/01/2023   Yearly kidney function blood test for diabetes  03/17/2023   Eye exam for diabetics  05/10/2023   Medicare Annual Wellness Visit  11/13/2023   DEXA scan (bone density measurement)  11/20/2023   Pneumonia Vaccine  Completed   HPV Vaccine  Aged Out   Colon Cancer Screening  Discontinued    Advanced directives: yes  Conditions/risks identified: low falls risk  Next appointment: Follow up in one year for your annual wellness visit 11/14/2023 @ 9:15am telephone   Preventive Care 65 Years and Older, Female Preventive care refers to lifestyle choices and visits with your health care provider that can promote health and wellness. What does preventive care include? A yearly physical exam. This is also called an annual well check. Dental exams once or twice a year. Routine eye exams. Ask your health care provider how often you should have your eyes checked. Personal lifestyle choices, including: Daily care of your teeth and gums. Regular physical activity. Eating a healthy diet. Avoiding tobacco and drug  use. Limiting alcohol use. Practicing safe sex. Taking low-dose aspirin every day. Taking vitamin and mineral supplements as recommended by your health care provider. What happens during an annual well check? The services and screenings done by your health care provider during your annual well check will depend on your age, overall health, lifestyle risk factors, and family history of disease. Counseling  Your health care provider may ask you questions about your: Alcohol use. Tobacco use. Drug use. Emotional well-being. Home and relationship well-being. Sexual activity. Eating habits. History of falls. Memory and ability to understand (cognition). Work and work Astronomer. Reproductive health. Screening  You may have the following tests or measurements: Height, weight, and BMI. Blood pressure. Lipid and cholesterol levels. These may be checked every 5 years, or more frequently if you are over 61 years old. Skin check. Lung cancer screening. You may have this screening every year starting at age 11 if you have a 30-pack-year history of smoking and currently smoke or have quit within the past 15 years. Fecal occult blood test (FOBT) of the stool. You may have this test every year starting at age 54. Flexible sigmoidoscopy or colonoscopy. You may have a sigmoidoscopy every 5 years or a colonoscopy every 10 years starting at age 21. Hepatitis C blood test. Hepatitis B blood test. Sexually transmitted disease (STD) testing. Diabetes screening. This is done by checking  your blood sugar (glucose) after you have not eaten for a while (fasting). You may have this done every 1-3 years. Bone density scan. This is done to screen for osteoporosis. You may have this done starting at age 40. Mammogram. This may be done every 1-2 years. Talk to your health care provider about how often you should have regular mammograms. Talk with your health care provider about your test results, treatment  options, and if necessary, the need for more tests. Vaccines  Your health care provider may recommend certain vaccines, such as: Influenza vaccine. This is recommended every year. Tetanus, diphtheria, and acellular pertussis (Tdap, Td) vaccine. You may need a Td booster every 10 years. Zoster vaccine. You may need this after age 1. Pneumococcal 13-valent conjugate (PCV13) vaccine. One dose is recommended after age 92. Pneumococcal polysaccharide (PPSV23) vaccine. One dose is recommended after age 1. Talk to your health care provider about which screenings and vaccines you need and how often you need them. This information is not intended to replace advice given to you by your health care provider. Make sure you discuss any questions you have with your health care provider. Document Released: 07/16/2015 Document Revised: 03/08/2016 Document Reviewed: 04/20/2015 Elsevier Interactive Patient Education  2017 [employer] Prevention in the Home Falls can cause injuries. They can happen to people of all ages. There are many things you can do to make your home safe and to help prevent falls. What can I do on the outside of my home? Regularly fix the edges of walkways and driveways and fix any cracks. Remove anything that might make you trip as you walk through a door, such as a raised step or threshold. Trim any bushes or trees on the path to your home. Use bright outdoor lighting. Clear any walking paths of anything that might make someone trip, such as rocks or tools. Regularly check to see if handrails are loose or broken. Make sure that both sides of any steps have handrails. Any raised decks and porches should have guardrails on the edges. Have any leaves, snow, or ice cleared regularly. Use sand or salt on walking paths during winter. Clean up any spills in your garage right away. This includes oil or grease spills. What can I do in the bathroom? Use night lights. Install grab  bars by the toilet and in the tub and shower. Do not use towel bars as grab bars. Use non-skid mats or decals in the tub or shower. If you need to sit down in the shower, use a plastic, non-slip stool. Keep the floor dry. Clean up any water that spills on the floor as soon as it happens. Remove soap buildup in the tub or shower regularly. Attach bath mats securely with double-sided non-slip rug tape. Do not have throw rugs and other things on the floor that can make you trip. What can I do in the bedroom? Use night lights. Make sure that you have a light by your bed that is easy to reach. Do not use any sheets or blankets that are too big for your bed. They should not hang down onto the floor. Have a firm chair that has side arms. You can use this for support while you get dressed. Do not have throw rugs and other things on the floor that can make you trip. What can I do in the kitchen? Clean up any spills right away. Avoid walking on wet floors. Keep items that you use a lot  in easy-to-reach places. If you need to reach something above you, use a strong step stool that has a grab bar. Keep electrical cords out of the way. Do not use floor polish or wax that makes floors slippery. If you must use wax, use non-skid floor wax. Do not have throw rugs and other things on the floor that can make you trip. What can I do with my stairs? Do not leave any items on the stairs. Make sure that there are handrails on both sides of the stairs and use them. Fix handrails that are broken or loose. Make sure that handrails are as long as the stairways. Check any carpeting to make sure that it is firmly attached to the stairs. Fix any carpet that is loose or worn. Avoid having throw rugs at the top or bottom of the stairs. If you do have throw rugs, attach them to the floor with carpet tape. Make sure that you have a light switch at the top of the stairs and the bottom of the stairs. If you do not have them,  ask someone to add them for you. What else can I do to help prevent falls? Wear shoes that: Do not have high heels. Have rubber bottoms. Are comfortable and fit you well. Are closed at the toe. Do not wear sandals. If you use a stepladder: Make sure that it is fully opened. Do not climb a closed stepladder. Make sure that both sides of the stepladder are locked into place. Ask someone to hold it for you, if possible. Clearly mark and make sure that you can see: Any grab bars or handrails. First and last steps. Where the edge of each step is. Use tools that help you move around (mobility aids) if they are needed. These include: Canes. Walkers. Scooters. Crutches. Turn on the lights when you go into a dark area. Replace any light bulbs as soon as they burn out. Set up your furniture so you have a clear path. Avoid moving your furniture around. If any of your floors are uneven, fix them. If there are any pets around you, be aware of where they are. Review your medicines with your doctor. Some medicines can make you feel dizzy. This can increase your chance of falling. Ask your doctor what other things that you can do to help prevent falls. This information is not intended to replace advice given to you by your health care provider. Make sure you discuss any questions you have with your health care provider. Document Released: 04/15/2009 Document Revised: 11/25/2015 Document Reviewed: 07/24/2014 Elsevier Interactive Patient Education  2017 [employer].

## 2022-11-22 ENCOUNTER — Encounter: Payer: Self-pay | Admitting: Physician Assistant

## 2022-11-22 ENCOUNTER — Ambulatory Visit (INDEPENDENT_AMBULATORY_CARE_PROVIDER_SITE_OTHER): Payer: Medicare Other | Admitting: Physician Assistant

## 2022-11-22 VITALS — BP 125/62 | HR 58 | Temp 98.0°F | Resp 13 | Ht 61.0 in | Wt 166.3 lb

## 2022-11-22 DIAGNOSIS — E1151 Type 2 diabetes mellitus with diabetic peripheral angiopathy without gangrene: Secondary | ICD-10-CM | POA: Diagnosis not present

## 2022-11-22 DIAGNOSIS — E1169 Type 2 diabetes mellitus with other specified complication: Secondary | ICD-10-CM | POA: Diagnosis not present

## 2022-11-22 DIAGNOSIS — I152 Hypertension secondary to endocrine disorders: Secondary | ICD-10-CM

## 2022-11-22 DIAGNOSIS — E1159 Type 2 diabetes mellitus with other circulatory complications: Secondary | ICD-10-CM | POA: Diagnosis not present

## 2022-11-22 DIAGNOSIS — L237 Allergic contact dermatitis due to plants, except food: Secondary | ICD-10-CM | POA: Diagnosis not present

## 2022-11-22 DIAGNOSIS — E1140 Type 2 diabetes mellitus with diabetic neuropathy, unspecified: Secondary | ICD-10-CM | POA: Diagnosis not present

## 2022-11-22 DIAGNOSIS — B351 Tinea unguium: Secondary | ICD-10-CM

## 2022-11-22 MED ORDER — CLOBETASOL PROPIONATE 0.05 % EX OINT: 1.0000 | TOPICAL_OINTMENT | Freq: Two times a day (BID) | CUTANEOUS | 0 refills | Status: DC

## 2022-11-22 NOTE — Assessment & Plan Note (Signed)
Prior A1c 7.4% Managed with metformin 1000 mg Bid and jardiance 10.  Hypoglycemia w/ glipizide.  Pt denies hypoglycemia Repeat A1c today Consider inc jardiance if cont to increase. Foot exam today; uacr ordered, on statin, arb F/u 4-6 mo pending a1c

## 2022-11-22 NOTE — Assessment & Plan Note (Signed)
Well controlled Cont losartan 25 mg Ordered cmp F/u 6 mo

## 2022-11-22 NOTE — Assessment & Plan Note (Signed)
Pt declines podiatry ref

## 2022-11-22 NOTE — Progress Notes (Signed)
I,Vanessa  Vital,acting as a Neurosurgeon for [employer], PA-C.,have documented all relevant documentation on the behalf of Alfredia Ferguson, PA-C,as directed by  Alfredia Ferguson, PA-C while in the presence of Alfredia Ferguson, PA-C.    Established patient visit   Patient: Kathleen Tran   DOB: 1940-11-18   82 y.o. Female  MRN: [id number] Visit Date: 11/22/2022  Today's healthcare provider: Alfredia Ferguson, PA-C   Cc. DM,HTN f/u  Subjective    HPI Pt reports last week she was outside in the yard working w/o gloves on. Reports a red itchy rash on face, chest, R arm, L leg.  \ Diabetes Mellitus Type II, Follow-up  Lab Results  Component Value Date   HGBA1C 7.4 (A) 07/26/2022   HGBA1C 6.5 (H) 03/16/2022   HGBA1C 6.8 (H) 10/26/2021   Wt Readings from Last 3 Encounters:  11/22/22 166 lb 4.8 oz (75.4 kg)  11/13/22 170 lb (77.1 kg)  08/08/22 170 lb (77.1 kg)   Last seen for diabetes 4 months ago.  Management since then includes no changes. She reports good compliance with treatment. She is not having side effects. Symptoms: No fatigue No foot ulcerations  No appetite changes No nausea  No paresthesia of the feet  No polydipsia  No polyuria Yes visual disturbances   No vomiting     Home blood sugar records:  this morning was 140  Pertinent Labs: Lab Results  Component Value Date   CHOL 176 10/26/2021   HDL 70 10/26/2021   LDLCALC 82 10/26/2021   TRIG 142 10/26/2021   CHOLHDL 2.5 10/26/2021   Lab Results  Component Value Date   NA 143 03/16/2022   K 4.7 03/16/2022   CREATININE 0.68 03/16/2022   EGFR 89 03/16/2022   LABMICR <3.0 10/26/2021   MICRALBCREAT <5 10/26/2021     --------------------------------------------------------------------------------------------------- Hypertension, follow-up  BP Readings from Last 3 Encounters:  11/22/22 125/62  08/08/22 131/74  07/26/22 (!) 113/59   Wt Readings from Last 3 Encounters:  11/22/22 166 lb 4.8 oz (75.4 kg)   11/13/22 170 lb (77.1 kg)  08/08/22 170 lb (77.1 kg)     She was last seen for hypertension 4 months ago.  BP at that visit was 113/59. Management since that visit includes no changes.  She reports good compliance with treatment. She is not having side effects. She is following a Regular diet. She is not exercising. She does not smoke.  Symptoms: No chest pain No chest pressure  No palpitations No syncope  No dyspnea No orthopnea  No paroxysmal nocturnal dyspnea No lower extremity edema   Pertinent labs Lab Results  Component Value Date   CHOL 176 10/26/2021   HDL 70 10/26/2021   LDLCALC 82 10/26/2021   TRIG 142 10/26/2021   CHOLHDL 2.5 10/26/2021   Lab Results  Component Value Date   NA 143 03/16/2022   K 4.7 03/16/2022   CREATININE 0.68 03/16/2022   EGFR 89 03/16/2022   GLUCOSE 124 (H) 03/16/2022   TSH 2.150 10/26/2021     The ASCVD Risk score (Arnett DK, et al., 2019) failed to calculate for the following reasons:   The 2019 ASCVD risk score is only valid for ages 60 to 26  ---------------------------------------------------------------------------------------------------   Medications: Outpatient Medications Prior to Visit  Medication Sig   acetaminophen (TYLENOL) 500 MG tablet Take 1,000 mg by mouth daily as needed for moderate pain or headache.   aspirin 81 MG chewable tablet Chew 81 mg by mouth daily.  Blood Glucose Monitoring Suppl (ONETOUCH VERIO IQ SYSTEM) w/Device KIT Check blood sugar a.c.h.s.   Calcium Carb-Cholecalciferol (CALCIUM 1000 + D PO) Take 1 tablet by mouth daily.   gabapentin (NEURONTIN) 400 MG capsule TAKE 1 CAPSULE(400 MG) BY MOUTH THREE TIMES DAILY   glucose blood (ONETOUCH VERIO) test strip TEST BLOOD SUGAR DAILY   JARDIANCE 10 MG TABS tablet TAKE 1 TABLET(10 MG) BY MOUTH DAILY BEFORE BREAKFAST   losartan (COZAAR) 25 MG tablet Take 1 tablet (25 mg total) by mouth daily.   meloxicam (MOBIC) 15 MG tablet Take 1 tablet (15 mg total) by  mouth daily.   metFORMIN (GLUCOPHAGE) 1000 MG tablet TAKE 1 TABLET(1000 MG) BY MOUTH TWICE DAILY WITH A MEAL   Multiple Vitamins-Minerals (ADULT GUMMY PO) Take 2 tablets by mouth daily before lunch.   omeprazole (PRILOSEC) 20 MG capsule TAKE 1 CAPSULE(20 MG) BY MOUTH DAILY   OneTouch Delica Lancets 33G MISC To check blood sugar once daily   pravastatin (PRAVACHOL) 80 MG tablet TAKE 1 TABLET(80 MG) BY MOUTH EVERY EVENING   vitamin B-12 (CYANOCOBALAMIN) 1000 MCG tablet Take 1,000 mcg by mouth daily before lunch.   No facility-administered medications prior to visit.    Review of Systems  Constitutional:  Negative for fatigue and fever.  Respiratory:  Negative for cough and shortness of breath.   Cardiovascular:  Negative for chest pain and leg swelling.  Gastrointestinal:  Negative for abdominal pain.  Skin:  Positive for rash.  Neurological:  Negative for dizziness and headaches.       Objective    BP 125/62 (BP Location: Left Arm, Patient Position: Sitting, Cuff Size: Normal)   Pulse (!) 58   Temp 98 F (36.7 C) (Oral)   Resp 13   Ht 5\' 1"  (1.549 m)   Wt 166 lb 4.8 oz (75.4 kg)   SpO2 99%   BMI 31.42 kg/m    Physical Exam Constitutional:      General: She is awake.     Appearance: She is well-developed.  HENT:     Head: Normocephalic.  Eyes:     Conjunctiva/sclera: Conjunctivae normal.  Cardiovascular:     Rate and Rhythm: Normal rate and regular rhythm.     Pulses:          Dorsalis pedis pulses are 2+ on the right side and 2+ on the left side.       Posterior tibial pulses are 2+ on the right side and 2+ on the left side.     Heart sounds: Normal heart sounds.  Pulmonary:     Effort: Pulmonary effort is normal.     Breath sounds: Normal breath sounds.  Feet:     Right foot:     Protective Sensation: 4 sites tested.  4 sites sensed.     Skin integrity: Skin integrity normal.     Toenail Condition: Right toenails are abnormally thick and long. Fungal disease  present.    Left foot:     Protective Sensation: 4 sites tested.  2 sites sensed.     Skin integrity: Skin integrity normal.     Toenail Condition: Left toenails are abnormally thick and long. Fungal disease present. Skin:    General: Skin is warm.     Comments: Chest, R face, R arm, L leg with an erythematous rash w/ excoriation marks  Neurological:     Mental Status: She is alert and oriented to person, place, and time.  Psychiatric:  Attention and Perception: Attention normal.        Mood and Affect: Mood normal.        Speech: Speech normal.        Behavior: Behavior is cooperative.     No results found for any visits on 11/22/22.  Assessment & Plan     . 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  Hypoglycemia w/ glipizide.  Pt denies hypoglycemia Repeat A1c today Consider inc jardiance if cont to increase. Foot exam today; uacr ordered, on statin, arb F/u 4-6 mo pending a1c      Relevant Orders   Comprehensive Metabolic Panel (CMET)   CBC w/Diff/Platelet   HgB A1c   Urine Microalbumin w/creat. ratio   Other Visit Diagnoses     Poison ivy dermatitis       Relevant Medications   clobetasol ointment (TEMOVATE) 0.05 %      Poison ivy dermatitis Rx topical steroid, ok to use on limbs, chest in limited amounts. Advised pt not to use on her face Ok to use otc hydrocortisone on face  - clobetasol ointment (TEMOVATE) 0.05 %; Apply 1 Application topically 2 (two) times daily.  Dispense: 30  g; Refill: 0 Return in about 6 months (around 05/25/2023), or pending a1c.      I, Alfredia Ferguson, PA-C have reviewed all documentation for this visit. The documentation on  11/22/22   for the exam, diagnosis, procedures, and orders are all accurate and complete.  Alfredia Ferguson, PA-C [facility] [address] [place], [place], [address] Office: [phone] Fax: [phone]   [facility]

## 2022-11-23 LAB — COMPREHENSIVE METABOLIC PANEL
ALT: 15 IU/L (ref 0–32)
AST: 19 IU/L (ref 0–40)
Albumin/Globulin Ratio: 2.1 (ref 1.2–2.2)
Albumin: 4.2 g/dL (ref 3.8–4.8)
Alkaline Phosphatase: 93 IU/L (ref 44–121)
BUN/Creatinine Ratio: 26 (ref 12–28)
BUN: 18 mg/dL (ref 8–27)
Bilirubin Total: 0.3 mg/dL (ref 0.0–1.2)
CO2: 24 mmol/L (ref 20–29)
Calcium: 9.8 mg/dL (ref 8.7–10.3)
Chloride: 104 mmol/L (ref 96–106)
Creatinine, Ser: 0.69 mg/dL (ref 0.57–1.00)
Globulin, Total: 2 g/dL (ref 1.5–4.5)
Glucose: 97 mg/dL (ref 70–99)
Potassium: 5.2 mmol/L (ref 3.5–5.2)
Sodium: 141 mmol/L (ref 134–144)
Total Protein: 6.2 g/dL (ref 6.0–8.5)
eGFR: 88 mL/min/{1.73_m2} (ref >59)

## 2022-11-23 LAB — CBC WITH DIFFERENTIAL/PLATELET
Basophils Absolute: 0.1 10*3/uL (ref 0.0–0.2)
Basos: 1 %
EOS (ABSOLUTE): 0.4 10*3/uL (ref 0.0–0.4)
Eos: 7 %
Hematocrit: 39.1 % (ref 34.0–46.6)
Hemoglobin: 12.9 g/dL (ref 11.1–15.9)
Immature Grans (Abs): 0 10*3/uL (ref 0.0–0.1)
Immature Granulocytes: 0 %
Lymphocytes Absolute: 1.5 10*3/uL (ref 0.7–3.1)
Lymphs: 28 %
MCH: 30.6 pg (ref 26.6–33.0)
MCHC: 33 g/dL (ref 31.5–35.7)
MCV: 93 fL (ref 79–97)
Monocytes Absolute: 0.7 10*3/uL (ref 0.1–0.9)
Monocytes: 12 %
Neutrophils Absolute: 2.9 10*3/uL (ref 1.4–7.0)
Neutrophils: 52 %
Platelets: 268 10*3/uL (ref 150–450)
RBC: 4.22 x10E6/uL (ref 3.77–5.28)
RDW: 13.5 % (ref 11.7–15.4)
WBC: 5.5 10*3/uL (ref 3.4–10.8)

## 2022-11-23 LAB — HEMOGLOBIN A1C
Est. average glucose Bld gHb Est-mCnc: 163 mg/dL
Hgb A1c MFr Bld: 7.3 % — ABNORMAL HIGH (ref 4.8–5.6)

## 2022-11-23 LAB — MICROALBUMIN / CREATININE URINE RATIO
Creatinine, Urine: 76.6 mg/dL
Microalb/Creat Ratio: 8 mg/g creat (ref 0–29)
Microalbumin, Urine: 6 ug/mL

## 2022-11-28 ENCOUNTER — Telehealth: Payer: Self-pay

## 2022-11-28 NOTE — Telephone Encounter (Signed)
Copied from CRM [id number]. Topic: General - Other >> Nov 28, 2022  3:44 PM Everette C wrote: Reason for CRM: Kim with [facility] has called to confirm a request for a prescription for a freestyle libre as well as request additional documents   Additional chart notes are needed to support the patient's concerns related to hypoglycemia   Please fax to [facility] when possible at [phone]  Please contact Kim further if needed

## 2022-11-29 NOTE — Telephone Encounter (Signed)
Rx reprinted and office visit printed. Placed on sorter to be faxed

## 2022-12-01 ENCOUNTER — Telehealth: Payer: Self-pay | Admitting: Physician Assistant

## 2022-12-01 NOTE — Telephone Encounter (Signed)
Kathleen Tran is calling from [facility] is calling stating received order for glucose monitor still needing office visits for the past  6 months. Please advise CB- [phone] Fax- [phone]

## 2022-12-11 ENCOUNTER — Ambulatory Visit: Payer: Self-pay

## 2022-12-11 NOTE — Patient Outreach (Signed)
  Care Coordination   Initial Visit Note   12/11/2022 Name: Kathleen Tran MRN: [id number] DOB: 09-21-1940  Kathleen Tran is a 82 y.o. year old female who sees Alfredia Ferguson, [place] for primary care. I spoke with  [name] by phone today.  What matters to the patients health and wellness today?  CM educated patient on [facility] services.  Patient declines services and feels that she is able to manage her medical needs.  Patient agreed to contact her provider in the future if she needs [facility] services.    Goals Addressed   None     SDOH assessments and interventions completed:  No     Care Coordination Interventions:  No, not indicated   Follow up plan: No further intervention required.   Encounter Outcome:  Pt. Refused

## 2022-12-28 ENCOUNTER — Telehealth: Payer: Self-pay | Admitting: Physician Assistant

## 2022-12-28 DIAGNOSIS — E119 Type 2 diabetes mellitus without complications: Secondary | ICD-10-CM

## 2022-12-28 NOTE — Telephone Encounter (Signed)
[facility] pharmacy requesting prescription refill glucose blood (ONETOUCH VERIO) test strip   Please advise

## 2022-12-29 MED ORDER — ONETOUCH VERIO VI STRP
1.0000 | ORAL_STRIP | Freq: Every day | 3 refills | Status: DC
Start: 2022-12-29 — End: 2023-04-10

## 2023-02-12 DIAGNOSIS — H26491 Other secondary cataract, right eye: Secondary | ICD-10-CM | POA: Diagnosis not present

## 2023-02-12 DIAGNOSIS — H43813 Vitreous degeneration, bilateral: Secondary | ICD-10-CM | POA: Diagnosis not present

## 2023-02-16 ENCOUNTER — Ambulatory Visit: Payer: Self-pay

## 2023-02-16 NOTE — Telephone Encounter (Signed)
Chief Complaint: Medication Question Frequency: Once daily   Disposition: [] ED /[] Urgent Care (no appt availability in office) / [] Appointment(In office/virtual)/ []  [facility]/ [] Home Care/ [] Refused Recommended Disposition /[]  Mobile Bus/ [x]  Follow-up with PCP Additional Notes: Patient request that her Rx be updated to the way she is currently taking her Metformin. Patient stated that she takes 1 tablet daily in the evening before dinner. Patient does not take it 2 times daily. Advised I would forward message to provider to make them aware. Patient  verbalized understanding. Reason for Disposition  [1] Caller has NON-URGENT medicine question about med that PCP prescribed AND [2] triager unable to answer question  Answer Assessment - Initial Assessment Questions 1. NAME of MEDICINE: "What medicine(s) are you calling about?"     Metformin 1000mg   2. QUESTION: "What is your question?" (e.g., double dose of medicine, side effect)     Can my Rx be updated to the way I am taking the medication please?  3. PRESCRIBER: "Who prescribed the medicine?" Reason: if prescribed by specialist, call should be referred to that group.     Lillia Abed, PA 4. SYMPTOMS: "Do you have any symptoms?" If Yes, ask: "What symptoms are you having?"  "How bad are the symptoms (e.g., mild, moderate, severe)     None  Protocols used: Medication Question Call-A-AH

## 2023-02-16 NOTE — Telephone Encounter (Signed)
Second attempt to reach pt. Left message to call back. 

## 2023-02-16 NOTE — Telephone Encounter (Signed)
Summary: Pt reports that the instructions for metFORMIN (GLUCOPHAGE) 1000 MG tablet is incorrect.   Pt reports that the instructions for metFORMIN (GLUCOPHAGE) 1000 MG tablet is incorrect. Pt requests that the Rx instructions be corrected because she only takes 1 tablet daily. Cb# [phone]      Left message to call back about request.

## 2023-02-17 ENCOUNTER — Other Ambulatory Visit: Payer: Self-pay | Admitting: Family Medicine

## 2023-02-17 DIAGNOSIS — E1140 Type 2 diabetes mellitus with diabetic neuropathy, unspecified: Secondary | ICD-10-CM

## 2023-02-19 ENCOUNTER — Other Ambulatory Visit: Payer: Self-pay | Admitting: Family Medicine

## 2023-02-19 ENCOUNTER — Telehealth: Payer: Self-pay | Admitting: Family Medicine

## 2023-02-19 DIAGNOSIS — E1140 Type 2 diabetes mellitus with diabetic neuropathy, unspecified: Secondary | ICD-10-CM

## 2023-02-19 MED ORDER — GABAPENTIN 400 MG PO CAPS: ORAL_CAPSULE | ORAL | 1 refills | Status: DC

## 2023-02-19 NOTE — Telephone Encounter (Signed)
[facility] pharmacy is requesting prescription refill gabapentin (NEURONTIN) 400 MG capsule   Please advise

## 2023-02-19 NOTE — Telephone Encounter (Signed)
Requested Prescriptions  Pending Prescriptions Disp Refills   JARDIANCE 10 MG TABS tablet [Pharmacy Med Name: JARDIANCE 10MG  TABLETS] 90 tablet 1    Sig: TAKE 1 TABLET(10 MG) BY MOUTH DAILY BEFORE BREAKFAST     Endocrinology:  Diabetes - SGLT2 Inhibitors Passed - 02/17/2023  3:36 AM      Passed - Cr in normal range and within 360 days    Creatinine, Ser  Date Value Ref Range Status  11/22/2022 0.69 0.57 - 1.00 mg/dL Final         Passed - HBA1C is between 0 and 7.9 and within 180 days    Hgb A1c MFr Bld  Date Value Ref Range Status  11/22/2022 7.3 (H) 4.8 - 5.6 % Final    Comment:             Prediabetes: 5.7 - 6.4          Diabetes: >6.4          Glycemic control for adults with diabetes: <7.0          Passed - eGFR in normal range and within 360 days    GFR calc Af Amer  Date Value Ref Range Status  08/28/2019 101 >59 mL/min/1.73 Final   GFR calc non Af Amer  Date Value Ref Range Status  08/28/2019 87 >59 mL/min/1.73 Final   eGFR  Date Value Ref Range Status  11/22/2022 88 >59 mL/min/1.73 Final         Passed - Valid encounter within last 6 months    Recent Outpatient Visits           2 months ago Type 2 diabetes mellitus with diabetic neuropathy, without long-term current use of insulin (HCC)   [facility] [place], [place], PA-C   6 months ago Type 2 diabetes mellitus with diabetic neuropathy, without long-term current use of insulin [facility])   [facility] Ok Edwards, [place], PA-C   10 months ago Hypertension associated with diabetes [facility])   [facility] Edwards, [place], PA-C   11 months ago Hypertension associated with diabetes [facility])    [facility], Lillia Abed, PA-C   11 months ago Type 2 diabetes mellitus with diabetic neuropathy, without long-term current use of insulin [facility])   [facility] Alfredia Ferguson, [place]

## 2023-02-20 ENCOUNTER — Ambulatory Visit: Payer: Self-pay | Admitting: *Deleted

## 2023-02-20 ENCOUNTER — Other Ambulatory Visit: Payer: Self-pay | Admitting: Family Medicine

## 2023-02-20 DIAGNOSIS — E1140 Type 2 diabetes mellitus with diabetic neuropathy, unspecified: Secondary | ICD-10-CM

## 2023-02-20 MED ORDER — METFORMIN HCL 1000 MG PO TABS
1000.0000 mg | ORAL_TABLET | Freq: Every evening | ORAL | 3 refills | Status: AC
Start: 2023-02-20 — End: ?

## 2023-02-20 NOTE — Telephone Encounter (Signed)
  Chief Complaint: medication cost more than patient can afford for jardiance. Metformin instructions from pharmacy not the same as instructions on med list for metformin Symptoms: na Frequency: na Pertinent Negatives: Patient denies na Disposition: [] ED /[] Urgent Care (no appt availability in office) / [] Appointment(In office/virtual)/ []  [place] [facility]/ [] Home Care/ [] Refused Recommended Disposition /[]  Mobile Bus/ [x]  Follow-up with PCP Additional Notes:   Patient calling to report [facility] 1000 mg take 1 tablet (1000 mg) by mouth every morning. Patient reports she is supposed to take 1 tablet every evening as instructed by Verna Czech, FNP from 02/20/23. Called pharmacy to clarify PCP instructions spoke to [employer], pharmacist regarding instructions. Patient also reports she can not afford Jardiance, patient will have to pay $352 for medication. Patient requesting another medication until she switches insurance on Sept. 1 to [place]. Please advise.        Reason for Disposition  Prescription request for new medicine (not a refill)  Answer Assessment - Initial Assessment Questions 1. NAME of MEDICINE: "What medicine(s) are you calling about?"     Metformin and jardiance  2. QUESTION: "What is your question?" (e.g., double dose of medicine, side effect)     Instructions for metformin printed on metformin bottle from pharmacy listed for patient to take 1000 mg in the morning and patient takes in the evening. Current med list in chart documented to take metformin 1000 mg in the evening. Patient reports jardiance Rx will cost her #352 out of pocket and she can not afford medication at this time.  3. PRESCRIBER: "Who prescribed the medicine?" Reason: if prescribed by specialist, call should be referred to that group.     Verna Czech, FNP 4. SYMPTOMS: "Do you have any symptoms?" If Yes, ask: "What symptoms are you having?"  "How bad are the symptoms (e.g., mild,  moderate, severe)     na 5. PREGNANCY:  "Is there any chance that you are pregnant?" "When was your last menstrual period?"     na  Protocols used: Medication Question Call-A-AH

## 2023-02-23 DIAGNOSIS — G5603 Carpal tunnel syndrome, bilateral upper limbs: Secondary | ICD-10-CM | POA: Diagnosis not present

## 2023-03-08 DIAGNOSIS — M5412 Radiculopathy, cervical region: Secondary | ICD-10-CM | POA: Diagnosis not present

## 2023-03-09 DIAGNOSIS — H26491 Other secondary cataract, right eye: Secondary | ICD-10-CM | POA: Diagnosis not present

## 2023-03-20 DIAGNOSIS — M5412 Radiculopathy, cervical region: Secondary | ICD-10-CM | POA: Diagnosis not present

## 2023-03-22 DIAGNOSIS — M5412 Radiculopathy, cervical region: Secondary | ICD-10-CM | POA: Diagnosis not present

## 2023-03-28 DIAGNOSIS — M5412 Radiculopathy, cervical region: Secondary | ICD-10-CM | POA: Diagnosis not present

## 2023-04-03 DIAGNOSIS — M5412 Radiculopathy, cervical region: Secondary | ICD-10-CM | POA: Diagnosis not present

## 2023-04-05 ENCOUNTER — Encounter: Payer: Self-pay | Admitting: Family Medicine

## 2023-04-05 ENCOUNTER — Ambulatory Visit (INDEPENDENT_AMBULATORY_CARE_PROVIDER_SITE_OTHER): Payer: Medicare HMO | Admitting: Family Medicine

## 2023-04-05 VITALS — BP 118/69 | HR 66 | Ht 61.0 in | Wt 165.1 lb

## 2023-04-05 DIAGNOSIS — M5116 Intervertebral disc disorders with radiculopathy, lumbar region: Secondary | ICD-10-CM | POA: Diagnosis not present

## 2023-04-05 DIAGNOSIS — M5441 Lumbago with sciatica, right side: Secondary | ICD-10-CM | POA: Insufficient documentation

## 2023-04-05 MED ORDER — PREDNISONE 20 MG PO TABS: 20.0000 mg | ORAL_TABLET | Freq: Every day | ORAL | 0 refills | Status: DC

## 2023-04-05 MED ORDER — TIZANIDINE HCL 4 MG PO TABS: 4.0000 mg | ORAL_TABLET | Freq: Four times a day (QID) | ORAL | 0 refills | Status: DC | PRN

## 2023-04-05 NOTE — Patient Instructions (Signed)
Call on Monday and let us know how things are going with your back or send a mychart message.

## 2023-04-05 NOTE — Progress Notes (Signed)
Established patient visit   Patient: Kathleen Tran   DOB: Dec 28, 1940   82 y.o. Female  MRN: [id number] Visit Date: 04/05/2023  Today's healthcare provider: Jacky Kindle, FNP  Introduced to nurse practitioner role and practice setting.  All questions answered.  Discussed provider/patient relationship and expectations.  Subjective    HPI HPI     Medical Management of Chronic Issues    Additional comments: Follow up, left side has been hurting for the last few days, can't determine if its coming from the back or hip, just woke up one morning and it was present,tried walking on it but walking made it worse       Last edited by Rolly Salter, CMA on 04/05/2023  2:23 PM.     The patient, with a history of blood sugar issues, presents with sudden onset of back and side pain. The pain started one morning without any identifiable cause. The patient attempted to alleviate the pain by walking around, but this only seemed to exacerbate the discomfort. She has been using a topical nerve pain reliever, which has provided some relief. The patient has been participating in physical therapy and has informed the therapists about the new onset of back pain. The patient's blood sugar has been stable according to recent lab results.  Medications: Outpatient Medications Prior to Visit  Medication Sig   acetaminophen (TYLENOL) 500 MG tablet Take 1,000 mg by mouth daily as needed for moderate pain or headache.   aspirin 81 MG chewable tablet Chew 81 mg by mouth daily.   Blood Glucose Monitoring Suppl (ONETOUCH VERIO IQ SYSTEM) w/Device KIT Check blood sugar a.c.h.s.   Calcium Carb-Cholecalciferol (CALCIUM 1000 + D PO) Take 1 tablet by mouth daily.   clobetasol ointment (TEMOVATE) 0.05 % Apply 1 Application topically 2 (two) times daily.   gabapentin (NEURONTIN) 400 MG capsule TAKE 1 CAPSULE(400 MG) BY MOUTH THREE TIMES DAILY   glucose blood (ONETOUCH VERIO) test strip 1 each by Other route daily. for  testing blood sugar   JARDIANCE 10 MG TABS tablet TAKE 1 TABLET(10 MG) BY MOUTH DAILY BEFORE BREAKFAST   losartan (COZAAR) 25 MG tablet Take 1 tablet (25 mg total) by mouth daily.   meloxicam (MOBIC) 15 MG tablet Take 1 tablet (15 mg total) by mouth daily.   metFORMIN (GLUCOPHAGE) 1000 MG tablet Take 1 tablet (1,000 mg total) by mouth every evening.   Multiple Vitamins-Minerals (ADULT GUMMY PO) Take 2 tablets by mouth daily before lunch.   omeprazole (PRILOSEC) 20 MG capsule TAKE 1 CAPSULE(20 MG) BY MOUTH DAILY   OneTouch Delica Lancets 33G MISC To check blood sugar once daily   pravastatin (PRAVACHOL) 80 MG tablet TAKE 1 TABLET(80 MG) BY MOUTH EVERY EVENING   vitamin B-12 (CYANOCOBALAMIN) 1000 MCG tablet Take 1,000 mcg by mouth daily before lunch.   No facility-administered medications prior to visit.    Review of Systems      Objective    BP 118/69 (BP Location: Right Arm, Patient Position: Sitting, Cuff Size: Large)   Pulse 66   Ht 5\' 1"  (1.549 m)   Wt 165 lb 1.6 oz (74.9 kg)   BMI 31.20 kg/m     Physical Exam Vitals and nursing note reviewed.  Constitutional:      General: She is not in acute distress.    Appearance: Normal appearance. She is obese. She is not ill-appearing, toxic-appearing or diaphoretic.  HENT:     Head: Normocephalic and atraumatic.  Cardiovascular:  Rate and Rhythm: Normal rate and regular rhythm.     Pulses: Normal pulses.     Heart sounds: Normal heart sounds. No murmur heard.    No friction rub. No gallop.  Pulmonary:     Effort: Pulmonary effort is normal. No respiratory distress.     Breath sounds: Normal breath sounds. No stridor. No wheezing, rhonchi or rales.  Chest:     Chest wall: No tenderness.  Abdominal:     General: Bowel sounds are normal.     Palpations: Abdomen is soft.     Tenderness: There is no abdominal tenderness. There is no right CVA tenderness, left CVA tenderness or guarding.  Musculoskeletal:        General:  Tenderness present. No swelling, deformity or signs of injury. Normal range of motion.     Right lower leg: No edema.     Left lower leg: No edema.       Legs:  Skin:    General: Skin is warm and dry.     Capillary Refill: Capillary refill takes less than 2 seconds.     Coloration: Skin is not jaundiced or pale.     Findings: No bruising, erythema, lesion or rash.  Neurological:     General: No focal deficit present.     Mental Status: She is alert and oriented to person, place, and time. Mental status is at baseline.     Cranial Nerves: No cranial nerve deficit.     Sensory: No sensory deficit.     Motor: No weakness.     Coordination: Coordination normal.  Psychiatric:        Mood and Affect: Mood normal.        Behavior: Behavior normal.        Thought Content: Thought content normal.        Judgment: Judgment normal.     MUSCULOSKELETAL: Pain localized to the right side of the back, exacerbated by deep breathing, knee to chest position, bending knee to chest, and rotation to the left. Positive straight leg raise test on the right side, indicating potential nerve impingement. No pain on the left side of the back or with knee to chest movement on the left side. No abdominal pain upon palpation.  No results found for any visits on 04/05/23.  Assessment & Plan    Back Pain Sudden onset of back and side pain, likely due to a pinched nerve. No signs of urinary tract infection or constipation. Pain improved with nerve cream. Physical therapy ongoing. -Start Prednisone for 3 days starting 04/06/2023. -Continue Tylenol as needed for pain. -Add low dose muscle relaxant for nerve pain. -Perform back exercises provided. -Consider referral to [facility] if no improvement. -Check-in on 04/09/2023 to assess response to treatment.  General Health Maintenance Blood sugars well controlled as of last check in May. Vision check scheduled. -Continue current management.     Leilani Merl,  FNP, have reviewed all documentation for this visit. The documentation on 04/05/23 for the exam, diagnosis, procedures, and orders are all accurate and complete.  Jacky Kindle, FNP  [facility] [phone] (phone) [phone] (fax)  [facility]

## 2023-04-09 ENCOUNTER — Telehealth: Payer: Self-pay | Admitting: Family Medicine

## 2023-04-09 DIAGNOSIS — E119 Type 2 diabetes mellitus without complications: Secondary | ICD-10-CM

## 2023-04-09 NOTE — Telephone Encounter (Signed)
[facility] is requesting prescription refill glucose blood (ONETOUCH VERIO) test strip  Please advise

## 2023-04-10 DIAGNOSIS — M5412 Radiculopathy, cervical region: Secondary | ICD-10-CM | POA: Diagnosis not present

## 2023-04-10 MED ORDER — ONETOUCH VERIO VI STRP
1.0000 | ORAL_STRIP | Freq: Every day | 3 refills | Status: DC
Start: 2023-04-10 — End: 2023-05-28

## 2023-04-12 DIAGNOSIS — M5412 Radiculopathy, cervical region: Secondary | ICD-10-CM | POA: Diagnosis not present

## 2023-04-17 DIAGNOSIS — M5412 Radiculopathy, cervical region: Secondary | ICD-10-CM | POA: Diagnosis not present

## 2023-04-19 DIAGNOSIS — M5412 Radiculopathy, cervical region: Secondary | ICD-10-CM | POA: Diagnosis not present

## 2023-04-23 ENCOUNTER — Telehealth: Payer: Self-pay | Admitting: Family Medicine

## 2023-04-23 ENCOUNTER — Other Ambulatory Visit: Payer: Self-pay

## 2023-04-23 DIAGNOSIS — E1159 Type 2 diabetes mellitus with other circulatory complications: Secondary | ICD-10-CM

## 2023-04-23 MED ORDER — ONETOUCH DELICA LANCETS 33G MISC
4 refills | Status: DC
Start: 2023-04-23 — End: 2023-04-27

## 2023-04-23 NOTE — Telephone Encounter (Signed)
[facility] called and spoke to [place], Pensions consultant about the refill(s) one touch verio test strips requested. Advised it was sent on 04/10/23 #100/3 refill(s). She says they have those on file, but patient will need a new script for lancets. Advised I will send to the provider.

## 2023-04-23 NOTE — Telephone Encounter (Signed)
Medication Refill - Medication: Lancets and test strips one touch Verio  Has the patient contacted their pharmacy? Yes.   (Agent: If no, request that the patient contact the pharmacy for the refill. If patient does not wish to contact the pharmacy document the reason why and proceed with request.) (Agent: If yes, when and what did the pharmacy advise?)  Preferred Pharmacy (with phone number or street name): [facility] [address] Has the patient been seen for an appointment in the last year OR does the patient have an upcoming appointment? Yes.    Agent: Please be advised that RX refills may take up to 3 business days. We ask that you follow-up with your pharmacy.

## 2023-04-26 ENCOUNTER — Telehealth: Payer: Self-pay | Admitting: Family Medicine

## 2023-04-26 DIAGNOSIS — E1159 Type 2 diabetes mellitus with other circulatory complications: Secondary | ICD-10-CM

## 2023-04-26 DIAGNOSIS — E1140 Type 2 diabetes mellitus with diabetic neuropathy, unspecified: Secondary | ICD-10-CM

## 2023-04-26 NOTE — Telephone Encounter (Signed)
Patient called stated he insurance will not pay for her test strips as they will on cover AccuCheck and TruMetric. She also stated they will cover Dexcom G7 but she needs a new script for the monitor as well. Please f/u with pharmacy for more details.  [id number] Nicholes Rough, [place] - [address] AT [facility] [address] Phone: [phone]  Fax: [phone]

## 2023-04-26 NOTE — Telephone Encounter (Signed)
Medication Refill - Medication: test strips one touch Verio  2nd request  Has the patient contacted their pharmacy? no Request was put in back on  10/08/pt pharmacy said needs new script  Preferred Pharmacy (with phone number or street name):  [facility] #[id number] Nicholes Rough, [place] - [address] AT [facility] OF [address] Phone: [phone]  Fax: [phone]     Has the patient been seen for an appointment in the last year OR does the patient have an upcoming appointment? yes  Agent: Please be advised that RX refills may take up to 3 business days. We ask that you follow-up with your pharmacy.

## 2023-04-26 NOTE — Telephone Encounter (Addendum)
[facility] called and spoke to [employer], Pensions consultant about the refill(s) one touch verio test strips requested. Advised it was sent on 04/10/23 refill(s). She says they do have it on file. I asked about lancets and she says the patient picked up the One Touch Delica Lancets on 04/23/23. I asked would she be able to use them being they are different. She transferred me to [place], [facility] to speak to about this. Thayer Ohm says she will go ahead and process the strips to be filled and the patient did pick up the lancets. I asked could she use them and she says no they are different but she can take authorization to change it. I advised I can't give authorization for a new script. She asked me to hold on and placed me on hold. Long hold time, so I ended the call. New script for One Touch Verio Lancets will need to be sent in per the 04/23/23 encounter routed to the office.

## 2023-04-27 MED ORDER — BLOOD GLUCOSE MONITORING SUPPL DEVI: 1.0000 | Freq: Three times a day (TID) | 0 refills | Status: DC

## 2023-04-27 MED ORDER — BLOOD GLUCOSE TEST VI STRP
1.0000 | ORAL_STRIP | Freq: Three times a day (TID) | 0 refills | Status: AC
Start: 2023-04-27 — End: 2023-05-27

## 2023-04-27 MED ORDER — LANCETS MISC. MISC: 1.0000 | Freq: Three times a day (TID) | 0 refills | Status: AC

## 2023-04-27 MED ORDER — LANCET DEVICE MISC
1.0000 | Freq: Three times a day (TID) | 0 refills | Status: AC
Start: 2023-04-27 — End: 2023-05-27

## 2023-04-27 NOTE — Telephone Encounter (Signed)
New blood glucose monitoring suppl/with everything was sent in.

## 2023-04-27 NOTE — Telephone Encounter (Signed)
Kathleen Tran - can you please correct the lancet brand to Verio?  The ones we sent in the other day are incorrect and the patient has been without the lancets.

## 2023-05-14 DIAGNOSIS — Z961 Presence of intraocular lens: Secondary | ICD-10-CM | POA: Diagnosis not present

## 2023-05-14 DIAGNOSIS — H43813 Vitreous degeneration, bilateral: Secondary | ICD-10-CM | POA: Diagnosis not present

## 2023-05-14 DIAGNOSIS — E119 Type 2 diabetes mellitus without complications: Secondary | ICD-10-CM | POA: Diagnosis not present

## 2023-05-14 LAB — HM DIABETES EYE EXAM

## 2023-05-28 ENCOUNTER — Other Ambulatory Visit: Payer: Self-pay | Admitting: Family Medicine

## 2023-05-28 DIAGNOSIS — E119 Type 2 diabetes mellitus without complications: Secondary | ICD-10-CM

## 2023-05-28 NOTE — Telephone Encounter (Signed)
Not on med list. Please advise.

## 2023-06-05 ENCOUNTER — Other Ambulatory Visit: Payer: Self-pay | Admitting: Family Medicine

## 2023-06-05 DIAGNOSIS — E1140 Type 2 diabetes mellitus with diabetic neuropathy, unspecified: Secondary | ICD-10-CM

## 2023-06-05 DIAGNOSIS — E1159 Type 2 diabetes mellitus with other circulatory complications: Secondary | ICD-10-CM

## 2023-06-05 NOTE — Telephone Encounter (Signed)
Requested medications are due for refill today.  unsure  Requested medications are on the active medications list.  yes  Last refill. 05/28/2023  Future visit scheduled.   no  Notes to clinic.  This request is for a new machine. Pt  just received a new machine last month. Please advise.    Requested Prescriptions  Pending Prescriptions Disp Refills   Blood Glucose Monitoring Suppl (ACCU-CHEK GUIDE ME) w/Device KIT [Pharmacy Med Name: ACCU-CHEK GUIDE ME KIT]      Sig: USE TO CHECK BLOOD SUGAR EVERY MORNING, AT NOON, AND AT BEDTIME     Endocrinology: Diabetes - Testing Supplies Passed - 06/05/2023  4:37 PM      Passed - Valid encounter within last 12 months    Recent Outpatient Visits           2 months ago Radiculopathy due to lumbar intervertebral disc disorder   [facility] Merita Norton T, FNP   6 months ago Type 2 diabetes mellitus with diabetic neuropathy, without long-term current use of insulin [facility])   [facility] [place], [place], PA-C   10 months ago Type 2 diabetes mellitus with diabetic neuropathy, without long-term current use of insulin [facility])   [place] [facility] Alfredia Ferguson, PA-C   1 year ago Hypertension associated with diabetes [facility])   [facility] Alfredia Ferguson, PA-C   1 year ago Hypertension associated with diabetes [facility])   [facility] Alfredia Ferguson, [place]

## 2023-06-06 ENCOUNTER — Telehealth: Payer: Self-pay

## 2023-06-06 NOTE — Telephone Encounter (Signed)
Copied from CRM [id number]. Topic: General - Other >> Jun 06, 2023  3:33 PM Ja-Kwan M wrote: Reason for CRM: Pt reports that the thing that the lancets go in to is broken and she would like to make the doctor aware. Pt stated that she is unable to check her blood sugar level due to this. Pt requests call back to discuss. Cb# [phone]

## 2023-06-08 ENCOUNTER — Other Ambulatory Visit: Payer: Self-pay | Admitting: Family Medicine

## 2023-06-08 MED ORDER — LANCET DEVICE MISC: 1.0000 | Freq: Two times a day (BID) | 0 refills | Status: AC

## 2023-07-05 ENCOUNTER — Ambulatory Visit: Payer: Medicare HMO | Admitting: Family Medicine

## 2023-07-05 ENCOUNTER — Encounter: Payer: Self-pay | Admitting: Family Medicine

## 2023-07-05 VITALS — BP 144/76 | HR 66 | Resp 18 | Ht 61.0 in | Wt 166.0 lb

## 2023-07-05 DIAGNOSIS — Z7984 Long term (current) use of oral hypoglycemic drugs: Secondary | ICD-10-CM | POA: Diagnosis not present

## 2023-07-05 DIAGNOSIS — I152 Hypertension secondary to endocrine disorders: Secondary | ICD-10-CM | POA: Diagnosis not present

## 2023-07-05 DIAGNOSIS — R200 Anesthesia of skin: Secondary | ICD-10-CM

## 2023-07-05 DIAGNOSIS — E1140 Type 2 diabetes mellitus with diabetic neuropathy, unspecified: Secondary | ICD-10-CM

## 2023-07-05 DIAGNOSIS — E1159 Type 2 diabetes mellitus with other circulatory complications: Secondary | ICD-10-CM | POA: Diagnosis not present

## 2023-07-05 MED ORDER — DULOXETINE HCL 30 MG PO CPEP: 30.0000 mg | ORAL_CAPSULE | Freq: Every day | ORAL | 3 refills | Status: DC
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
## 2023-07-05 NOTE — Assessment & Plan Note (Signed)
 Hypertension managed with losartan  25 mg. Reports anxiety related to driving, which may affect blood pressure readings. Discussed the impact of anxiety on blood pressure and the importance of managing stress. - Recheck blood pressure before leaving - Continue losartan  25 mg - Monitor blood pressure and address anxiety as needed

## 2023-07-05 NOTE — Patient Instructions (Signed)
  Please visit [facility]  for your imaging ordered during today's visit   You do not need an appointment. We will follow up with the results once available.   [address] [employer] [address], [place], [place] [address]

## 2023-07-05 NOTE — Assessment & Plan Note (Signed)
 Diabetes managed with metformin  1000 mg, Jardiance  10 mg, and pravastatin . Last hemoglobin A1c was 7.3%. Reports fasting blood sugar of 161 mg/dL this morning. Emphasized the importance of consistent blood sugar monitoring and the role of hemoglobin A1c. - Order hemoglobin A1c test - Continue metformin  1000 mg, Jardiance  10 mg, pravastatin  - Review blood work results and adjust treatment as necessary

## 2023-07-06 LAB — HEMOGLOBIN A1C
Est. average glucose Bld gHb Est-mCnc: 160 mg/dL
Hgb A1c MFr Bld: 7.2 % — ABNORMAL HIGH (ref 4.8–5.6)

## 2023-07-09 ENCOUNTER — Telehealth: Payer: Self-pay | Admitting: Family Medicine

## 2023-07-09 DIAGNOSIS — E1140 Type 2 diabetes mellitus with diabetic neuropathy, unspecified: Secondary | ICD-10-CM

## 2023-07-09 NOTE — Telephone Encounter (Signed)
 Will submit referral to pharmacy for assistance with cost of medication   Please advise patient to wait for call from pharmacy team

## 2023-07-09 NOTE — Telephone Encounter (Signed)
 Pt states that she went to pick up her medication and they are charging her $500 for a 30 day supply and she cannot afford the medication. Please advise.  JARDIANCE  10 MG TABS tablet    [id number] [address], [place] - [address] AT [facility] [address] [place], [place] [address] Phone: [phone]  Fax: [phone]

## 2023-07-10 NOTE — Telephone Encounter (Signed)
 Attempted to call patient. No answer. Left VM for her to return call.   PEC if she calls back please advise of Dr. Danella Penton message below.

## 2023-07-10 NOTE — Telephone Encounter (Signed)
 Pt called back and verbalized understanding of PCP's message

## 2023-07-12 ENCOUNTER — Telehealth: Payer: Self-pay | Admitting: Pharmacist

## 2023-07-12 NOTE — Progress Notes (Addendum)
   07/12/2023  Patient ID: Kathleen Tran, female   DOB: 02-Jul-1941, 83 y.o.   MRN: [id number]  Called and left voicemail on home phone as instructed per DPR.   Jardiance  would currently cost $581 for 30 tablets  Patient's insurance plan has a $450 drug deductible that she must pay out-of-pocket prior to insurance covering prescriptions afterwards, except for any Tier 1 drugs (which this is not).   Recommend she request to fill Jardiance  for $485 for 25 tablets- if the pharmacy is able to do this. It's about a $100 difference saved by excluding the 5 extra tablets. This is the closest amount with cost to meet her deductible and then she can continue to request 30 tablets for $47 each month after.  Update from 07/18/23: - Patient confirmed situation addressed and was able to pick-up her Jardiance  - No further concerns at this time   Aloysius Breeding, PharmD [facility] Phone Number: [phone]

## 2023-08-07 ENCOUNTER — Ambulatory Visit: Payer: Medicare Other | Admitting: Family Medicine

## 2023-08-07 ENCOUNTER — Encounter: Payer: Self-pay | Admitting: Family Medicine

## 2023-08-07 VITALS — BP 122/55 | HR 58 | Resp 18 | Ht 61.0 in | Wt 163.2 lb

## 2023-08-07 DIAGNOSIS — R251 Tremor, unspecified: Secondary | ICD-10-CM | POA: Insufficient documentation

## 2023-08-07 DIAGNOSIS — E1140 Type 2 diabetes mellitus with diabetic neuropathy, unspecified: Secondary | ICD-10-CM | POA: Diagnosis not present

## 2023-08-07 DIAGNOSIS — I152 Hypertension secondary to endocrine disorders: Secondary | ICD-10-CM

## 2023-08-07 DIAGNOSIS — E1159 Type 2 diabetes mellitus with other circulatory complications: Secondary | ICD-10-CM

## 2023-08-07 MED ORDER — EMPAGLIFLOZIN 10 MG PO TABS: 10.0000 mg | ORAL_TABLET | Freq: Every day | ORAL | 1 refills | Status: DC

## 2023-08-07 NOTE — Assessment & Plan Note (Signed)
 Chronic right hand tremor, worsening since 2022. Tremor is action and postural, with no bradykinesia, cogwheeling, or significant gait changes. Head tremor observed on exam today. Differential includes essential tremor and Parkinson's disease. Previous MRI and lab tests (vitamin B12, vitamin D ) were ordered by neurology. Gabapentin  400 mg TID has been ineffective. Patient prefers not to undergo MRI due to claustrophobia. - Discontinue gabapentin , pt states this was not effective  - Refer to neurology for further evaluation and management

## 2023-08-07 NOTE — Progress Notes (Signed)
 Marland Kitchen

## 2023-08-07 NOTE — Assessment & Plan Note (Signed)
 Blood pressure reading of 122/55, which is slightly low but acceptable given age. No symptoms of dizziness, lightheadedness, or near syncope reported. Discussed monitoring for symptoms and when to seek medical attention. Chronic  - Monitor for symptoms of dizziness, lightheadedness, or near syncop e -continue losartan  25mg  daily

## 2023-08-07 NOTE — Assessment & Plan Note (Signed)
A1c of 7.2. Patient is currently on Jardiance 10 mg. Chronic  Lab Results  Component Value Date   HGBA1C 7.2 (H) 07/05/2023    - Refill Jardiance 10mg   - continue jardiance, metformin 1000 mg every evening

## 2023-08-07 NOTE — Progress Notes (Signed)
 Established patient visit   Patient: Kathleen Tran   DOB: 08-09-1940   83 y.o. Female  MRN: [id number] Visit Date: 08/07/2023  Today's healthcare provider: Rockie Agent, MD   Chief Complaint  Patient presents with   Peripheral Neuropathy   Hand Problem    Right hand begins shaking sometimes when she is writing   Subjective     HPI     Hand Problem    Additional comments: Right hand begins shaking sometimes when she is writing      Last edited by Anette Alan CROME, CMA on 08/07/2023  2:37 PM.       Discussed the use of AI scribe software for clinical note transcription with the patient, who gave verbal consent to proceed.  History of Present Illness   Kathleen Tran is a 83 year old female who presents for chronic follow-up of a right hand tremor.  She has a right hand tremor that is particularly noticeable during activities such as writing or using a computer mouse. The tremor began suddenly and has worsened since she turned 82. She is right-handed, and the tremor is more pronounced in the right hand compared to the left, which only shakes slightly.  She has a history of action and postural tremor with bilateral hand and heel tremor that began in her sixties. She has been evaluated by neurology in the past, with a differential diagnosis including essential tremor versus Parkinson's disease. In 2022, she had a brain MRI ordered by neurology, but she does not recall the results. She was recommended to start gabapentin , initially at 300 mg at night, then 300 mg twice daily for 30 days. Currently, she takes gabapentin  400 mg three times a day, but it is not helping and she wishes to discontinue it.  She experiences a head tremor that is present most of the time but feels it has improved over time, becoming less intense. No resting tremor is noted, and the tremor primarily occurs during activities such as writing checks. She experiences headaches only when hungry.  She  experiences some motor symptoms during sleep, including dreaming, apnea, and dark shadows in her peripheral vision. No bradykinesia, cogwheeling, or gait changes are reported. Her right leg occasionally feels like it wants to give out, but she has not fallen. No changes in her voice or speech are noted, and she reports walking normally, though her right leg sometimes feels weak.         Past Medical History:  Diagnosis Date   Allergy don't know   I was a teen ager.   Arthritis    Cataract    Diabetes mellitus without complication (HCC)    GERD (gastroesophageal reflux disease)    Hepatitis    at age 20   History of herniated intervertebral disc    lower back   History of hiatal hernia    Hyperlipidemia    Hypertension    Osteoporosis    Peripheral edema    Tremors of nervous system    HEAD    Medications: Outpatient Medications Prior to Visit  Medication Sig   ACCU-CHEK GUIDE TEST test strip USE TO CHECK BLOOD SUGAR IN THE MORNING, AT NOON AND AT BEDTIME.   Accu-Chek Softclix Lancets lancets USE TO CHECK BLOOD SUGAR EVERY MORNING, AT NOON AND AT BEDTIME AS DIRECTED   acetaminophen  (TYLENOL ) 500 MG tablet Take 1,000 mg by mouth daily as needed for moderate pain or headache.   aspirin  81 MG chewable  tablet Chew 81 mg by mouth daily.   Blood Glucose Monitoring Suppl (ONETOUCH VERIO IQ SYSTEM) w/Device KIT Check blood sugar a.c.h.s.   Blood Glucose Monitoring Suppl DEVI 1 each by Does not apply route in the morning, at noon, and at bedtime. May substitute to any manufacturer covered by patient's insurance.   Calcium  Carb-Cholecalciferol (CALCIUM  1000 + D PO) Take 1 tablet by mouth daily.   gabapentin  (NEURONTIN ) 400 MG capsule TAKE 1 CAPSULE(400 MG) BY MOUTH THREE TIMES DAILY   losartan  (COZAAR ) 25 MG tablet Take 1 tablet (25 mg total) by mouth daily.   meloxicam  (MOBIC ) 15 MG tablet Take 1 tablet (15 mg total) by mouth daily.   metFORMIN  (GLUCOPHAGE ) 1000 MG tablet Take 1 tablet  (1,000 mg total) by mouth every evening.   Multiple Vitamins-Minerals (ADULT GUMMY PO) Take 2 tablets by mouth daily before lunch.   omeprazole  (PRILOSEC) 20 MG capsule TAKE 1 CAPSULE(20 MG) BY MOUTH DAILY   pravastatin  (PRAVACHOL ) 80 MG tablet TAKE 1 TABLET(80 MG) BY MOUTH EVERY EVENING   vitamin B-12 (CYANOCOBALAMIN ) 1000 MCG tablet Take 1,000 mcg by mouth daily before lunch.   [DISCONTINUED] DULoxetine  (CYMBALTA ) 30 MG capsule Take 1 capsule (30 mg total) by mouth daily.   [DISCONTINUED] JARDIANCE  10 MG TABS tablet TAKE 1 TABLET(10 MG) BY MOUTH DAILY BEFORE BREAKFAST   [DISCONTINUED] clobetasol  ointment (TEMOVATE ) 0.05 % Apply 1 Application topically 2 (two) times daily. (Patient not taking: Reported on 07/05/2023)   No facility-administered medications prior to visit.    Review of Systems  Last CBC Lab Results  Component Value Date   WBC 5.5 11/22/2022   HGB 12.9 11/22/2022   HCT 39.1 11/22/2022   MCV 93 11/22/2022   MCH 30.6 11/22/2022   RDW 13.5 11/22/2022   PLT 268 11/22/2022   Last metabolic panel Lab Results  Component Value Date   GLUCOSE 97 11/22/2022   NA 141 11/22/2022   K 5.2 11/22/2022   CL 104 11/22/2022   CO2 24 11/22/2022   BUN 18 11/22/2022   CREATININE 0.69 11/22/2022   EGFR 88 11/22/2022   CALCIUM  9.8 11/22/2022   PROT 6.2 11/22/2022   ALBUMIN 4.2 11/22/2022   LABGLOB 2.0 11/22/2022   AGRATIO 2.1 11/22/2022   BILITOT 0.3 11/22/2022   ALKPHOS 93 11/22/2022   AST 19 11/22/2022   ALT 15 11/22/2022   ANIONGAP 4 (L) 01/09/2019   Last lipids Lab Results  Component Value Date   CHOL 176 10/26/2021   HDL 70 10/26/2021   LDLCALC 82 10/26/2021   TRIG 142 10/26/2021   CHOLHDL 2.5 10/26/2021   Last hemoglobin A1c Lab Results  Component Value Date   HGBA1C 7.2 (H) 07/05/2023   Last thyroid  functions Lab Results  Component Value Date   TSH 2.150 10/26/2021   Last vitamin D  No results found for: 25OHVITD2, 25OHVITD3, VD25OH Last vitamin  B12 and Folate No results found for: VITAMINB12, FOLATE      Objective    BP (!) 122/55   Pulse (!) 58   Resp 18   Ht 5' 1 (1.549 m)   Wt 163 lb 4 oz (74 kg)   SpO2 95%   BMI 30.85 kg/m  BP Readings from Last 3 Encounters:  08/07/23 (!) 122/55  07/05/23 (!) 144/76  04/05/23 118/69   Wt Readings from Last 3 Encounters:  08/07/23 163 lb 4 oz (74 kg)  07/05/23 166 lb (75.3 kg)  04/05/23 165 lb 1.6 oz (74.9 kg)  Physical Exam  Physical Exam   VITALS: P- 58, BP- 122/55 NEUROLOGICAL: Good hand grip strength, normal shoulder shrug strength, no pronator drift, no asterixis, intact extraocular movements, head tremor   CARD: RRR  PULM: CTAB    No results found for any visits on 08/07/23.  Assessment & Plan     Problem List Items Addressed This Visit       Cardiovascular and Mediastinum   Hypertension associated with diabetes (HCC)   Blood pressure reading of 122/55, which is slightly low but acceptable given age. No symptoms of dizziness, lightheadedness, or near syncope reported. Discussed monitoring for symptoms and when to seek medical attention. Chronic  - Monitor for symptoms of dizziness, lightheadedness, or near syncop e -continue losartan  25mg  daily      Relevant Medications   empagliflozin  (JARDIANCE ) 10 MG TABS tablet     Endocrine   Type 2 diabetes mellitus with diabetic neuropathy, without long-term current use of insulin (HCC)   A1c of 7.2. Patient is currently on Jardiance  10 mg. Chronic  Lab Results  Component Value Date   HGBA1C 7.2 (H) 07/05/2023    - Refill Jardiance  10mg   - continue jardiance , metformin  1000 mg every evening      Relevant Medications   empagliflozin  (JARDIANCE ) 10 MG TABS tablet     Other   Tremor of both hands - Primary   Chronic right hand tremor, worsening since 2022. Tremor is action and postural, with no bradykinesia, cogwheeling, or significant gait changes. Head tremor observed on exam today.  Differential includes essential tremor and Parkinson's disease. Previous MRI and lab tests (vitamin B12, vitamin D ) were ordered by neurology. Gabapentin  400 mg TID has been ineffective. Patient prefers not to undergo MRI due to claustrophobia. - Discontinue gabapentin , pt states this was not effective  - Refer to neurology for further evaluation and management      Relevant Orders   Ambulatory referral to Neurology      No follow-ups on file.       Rockie Agent, MD  [facility] [phone] (phone) [phone] (fax)  [facility]

## 2023-08-15 ENCOUNTER — Other Ambulatory Visit: Payer: Self-pay | Admitting: Family Medicine

## 2023-08-15 DIAGNOSIS — E1140 Type 2 diabetes mellitus with diabetic neuropathy, unspecified: Secondary | ICD-10-CM

## 2023-08-15 NOTE — Telephone Encounter (Signed)
[facility] faxed refill request for the following medications:  gabapentin (NEURONTIN) 400 MG capsule   Please advise.

## 2023-08-16 NOTE — Telephone Encounter (Signed)
This was discontinued last office visit? Maybe we need to update her med list.

## 2023-08-21 ENCOUNTER — Telehealth: Payer: Self-pay | Admitting: Family Medicine

## 2023-08-21 NOTE — Telephone Encounter (Signed)
[facility] pharmacy is requesting refill gabapentin (NEURONTIN) 400 MG capsule   Please advise

## 2023-08-23 ENCOUNTER — Other Ambulatory Visit: Payer: Self-pay

## 2023-08-23 DIAGNOSIS — E1140 Type 2 diabetes mellitus with diabetic neuropathy, unspecified: Secondary | ICD-10-CM

## 2023-10-08 ENCOUNTER — Encounter: Payer: Self-pay | Admitting: Family

## 2023-10-08 ENCOUNTER — Ambulatory Visit: Admitting: Family

## 2023-10-08 VITALS — BP 140/84 | HR 58 | Ht 61.0 in | Wt 163.2 lb

## 2023-10-08 DIAGNOSIS — E538 Deficiency of other specified B group vitamins: Secondary | ICD-10-CM | POA: Diagnosis not present

## 2023-10-08 DIAGNOSIS — E785 Hyperlipidemia, unspecified: Secondary | ICD-10-CM | POA: Diagnosis not present

## 2023-10-08 DIAGNOSIS — E559 Vitamin D deficiency, unspecified: Secondary | ICD-10-CM | POA: Diagnosis not present

## 2023-10-08 DIAGNOSIS — R5383 Other fatigue: Secondary | ICD-10-CM | POA: Diagnosis not present

## 2023-10-08 DIAGNOSIS — E1159 Type 2 diabetes mellitus with other circulatory complications: Secondary | ICD-10-CM

## 2023-10-08 DIAGNOSIS — I152 Hypertension secondary to endocrine disorders: Secondary | ICD-10-CM | POA: Diagnosis not present

## 2023-10-08 DIAGNOSIS — E1140 Type 2 diabetes mellitus with diabetic neuropathy, unspecified: Secondary | ICD-10-CM

## 2023-10-08 DIAGNOSIS — E1169 Type 2 diabetes mellitus with other specified complication: Secondary | ICD-10-CM | POA: Diagnosis not present

## 2023-10-08 MED ORDER — FEXOFENADINE HCL 180 MG PO TABS: 180.0000 mg | ORAL_TABLET | Freq: Every day | ORAL | 1 refills | Status: AC

## 2023-10-08 MED ORDER — FLUTICASONE PROPIONATE 50 MCG/ACT NA SUSP: 1.0000 | Freq: Every day | NASAL | 2 refills | Status: AC

## 2023-10-08 NOTE — Progress Notes (Signed)
 New Patient Office Visit  Subjective  Patient ID: Kathleen Tran, female    DOB: Nov 21, 1940  Age: 83 y.o. MRN: [id number]  CC:  Chief Complaint  Patient presents with   Establish Care    NPE    HPI Kathleen Tran presents to establish care Previous Primary Care provider/office: Dr. Sharma  she does have additional concerns to discuss today.   Ears buzzing - She has been having this for several weeks now, asks if we can take a look at her ears to help figure out what is going on with this.   Feet - has been having having numbness, tingling, and burning pain. This has been going on for a few years, but has gotten much worse lately.  She does have a hx of diabetes. Unsure of B12, but is on Metformin .     Outpatient Encounter Medications as of 10/08/2023  Medication Sig   ACCU-CHEK GUIDE TEST test strip USE TO CHECK BLOOD SUGAR IN THE MORNING, AT NOON AND AT BEDTIME.   Accu-Chek Softclix Lancets lancets USE TO CHECK BLOOD SUGAR EVERY MORNING, AT NOON AND AT BEDTIME AS DIRECTED   acetaminophen  (TYLENOL ) 500 MG tablet Take 1,000 mg by mouth daily as needed for moderate pain or headache.   aspirin  81 MG chewable tablet Chew 81 mg by mouth daily.   Calcium  Carb-Cholecalciferol (CALCIUM  1000 + D PO) Take 1 tablet by mouth daily.   empagliflozin  (JARDIANCE ) 10 MG TABS tablet Take 1 tablet (10 mg total) by mouth daily.   fexofenadine  (ALLEGRA ) 180 MG tablet Take 1 tablet (180 mg total) by mouth daily.   fluticasone  (FLONASE ) 50 MCG/ACT nasal spray Place 1 spray into both nostrils daily.   losartan  (COZAAR ) 25 MG tablet Take 1 tablet (25 mg total) by mouth daily.   meloxicam  (MOBIC ) 15 MG tablet Take 1 tablet (15 mg total) by mouth daily.   metFORMIN  (GLUCOPHAGE ) 1000 MG tablet Take 1 tablet (1,000 mg total) by mouth every evening.   Multiple Vitamins-Minerals (ADULT GUMMY PO) Take 2 tablets by mouth daily before lunch.   omeprazole  (PRILOSEC) 20 MG capsule TAKE 1 CAPSULE(20 MG) BY  MOUTH DAILY   pravastatin  (PRAVACHOL ) 80 MG tablet TAKE 1 TABLET(80 MG) BY MOUTH EVERY EVENING   vitamin B-12 (CYANOCOBALAMIN ) 1000 MCG tablet Take 1,000 mcg by mouth daily before lunch.   Blood Glucose Monitoring Suppl (ONETOUCH VERIO IQ SYSTEM) w/Device KIT Check blood sugar a.c.h.s.   Blood Glucose Monitoring Suppl DEVI 1 each by Does not apply route in the morning, at noon, and at bedtime. May substitute to any manufacturer covered by patient's insurance.   gabapentin  (NEURONTIN ) 400 MG capsule TAKE 1 CAPSULE(400 MG) BY MOUTH THREE TIMES DAILY   No facility-administered encounter medications on file as of 10/08/2023.    Past Medical History:  Diagnosis Date   Acute pain of right shoulder 07/26/2022   Allergy don't know   I was a teen ager.   Arthritis    Cataract    Diabetes mellitus without complication (HCC)    GERD (gastroesophageal reflux disease)    Hepatitis    at age 18   History of herniated intervertebral disc    lower back   History of hiatal hernia    Hyperlipidemia    Hypertension    Osteoporosis    Peripheral edema    Tremors of nervous system    HEAD    Past Surgical History:  Procedure Laterality Date   APPENDECTOMY  BILATERAL CARPAL TUNNEL RELEASE Right 11/29/2021   Procedure: CARPAL TUNNEL RELEASE;  Surgeon: Cleotilde Barrio, MD;  Location: [facility];  Service: Orthopedics;  Laterality: Right;   CATARACT EXTRACTION W/PHACO Left 09/11/2018   Procedure: CATARACT EXTRACTION PHACO AND INTRAOCULAR LENS PLACEMENT (IOC) LEFT, DIABETIC;  Surgeon: Mittie Gaskin, MD;  Location: [facility];  Service: Ophthalmology;  Laterality: Left;  US   01:03 AP% 7.9 CDE 6.89 Fluid pack lot # [id number]   CATARACT EXTRACTION W/PHACO Right 02/11/2019   Procedure: CATARACT EXTRACTION PHACO AND INTRAOCULAR LENS PLACEMENT (IOC) RIGHT, DIABETIC;  Surgeon: Mittie Gaskin, MD;  Location: [facility];  Service: Ophthalmology;  Laterality: Right;  US  00:51 AP% 11.6 CDE 8.20 fluid  pack lot # [id number]   COLONOSCOPY WITH PROPOFOL  N/A 09/26/2017   Procedure: COLONOSCOPY WITH PROPOFOL ;  Surgeon: Janalyn Keene NOVAK, MD;  Location: [facility];  Service: Endoscopy;  Laterality: N/A;   COLONOSCOPY WITH PROPOFOL  N/A 10/16/2019   Procedure: COLONOSCOPY WITH PROPOFOL ;  Surgeon: Jinny Carmine, MD;  Location: [facility];  Service: Endoscopy;  Laterality: N/A;   CYST EXCISION     base of spine   EYE SURGERY Left    cataract extraction   JOINT REPLACEMENT     PARTIAL HIP ARTHROPLASTY Right    TONSILLECTOMY     TOTAL HIP ARTHROPLASTY Left 01/08/2019   Procedure: TOTAL HIP ARTHROPLASTY ANTERIOR APPROACH;  Surgeon: Leora Lynwood SAUNDERS, MD;  Location: [facility];  Service: Orthopedics;  Laterality: Left;   TUBAL LIGATION      Family History  Problem Relation Age of Onset   Early death Mother    Heart disease Maternal Grandfather    Cancer Maternal Grandmother    Asthma Daughter    Diabetes Son    Hypertension Sister    Varicose Veins Sister     Social History   Socioeconomic History   Marital status: Married    Spouse name: elsie ...son   Number of children: 6   Years of education: Not on file   Highest education level: GED or equivalent  Occupational History   Occupation: retired  Tobacco Use   Smoking status: Never   Smokeless tobacco: Never  Vaping Use   Vaping status: Never Used  Substance and Sexual Activity   Alcohol use: No   Drug use: No   Sexual activity: Not Currently    Birth control/protection: None    Comment: Don't need it  Other Topics Concern   Not on file  Social History Narrative   Not on file   Social Drivers of Health   Financial Resource Strain: Low Risk  (11/21/2023)   Received from [facility]   Overall Financial Resource Strain (CARDIA)    Difficulty of Paying Living Expenses: Not very hard  Recent Concern: Financial Resource Strain - Medium Risk (10/16/2023)   Received from [facility]    Overall Financial Resource Strain (CARDIA)    Difficulty of Paying Living Expenses: Somewhat hard  Food Insecurity: Food Insecurity Present (11/21/2023)   Received from [facility]   Hunger Vital Sign    Within the past 12 months, you worried that your food would run out before you got the money to buy more.: Sometimes true    Within the past 12 months, the food you bought just didn't last and you didn't have money to get more.: Never true  Transportation Needs: No Transportation Needs (11/21/2023)   Received from [facility]   [facility] - Transportation  In the past 12 months, has lack of transportation kept you from medical appointments or from getting medications?: No    Lack of Transportation (Non-Medical): No  Physical Activity: Insufficiently Active (11/18/2022)   Exercise Vital Sign    Days of Exercise per Week: 1 day    Minutes of Exercise per Session: 30 min  Stress: No Stress Concern Present (11/18/2022)   [employer] of Occupational Health - Occupational Stress Questionnaire    Feeling of Stress : Not at all  Social Connections: Moderately Integrated (11/18/2022)   Social Connection and Isolation Panel    Frequency of Communication with Friends and Family: Once a week    Frequency of Social Gatherings with Friends and Family: Once a week    Attends Religious Services: More than 4 times per year    Active Member of [employer] or Organizations: Yes    Attends Banker Meetings: More than 4 times per year    Marital Status: Married  Catering manager Violence: Not At Risk (11/13/2022)   Humiliation, Afraid, Rape, and Kick questionnaire    Fear of Current or Ex-Partner: No    Emotionally Abused: No    Physically Abused: No    Sexually Abused: No    Review of Systems  HENT:  Positive for tinnitus.   Neurological:  Positive for tingling.  All other systems reviewed and are negative.       Objective   BP (!) 140/84    Pulse (!) 58   Ht 5' 1 (1.549 m)   Wt 163 lb 3.2 oz (74 kg)   SpO2 96%   BMI 30.84 kg/m   Physical Exam Vitals and nursing note reviewed.  Constitutional:      Appearance: Normal appearance. She is obese.  HENT:     Head: Normocephalic.   Eyes:     Extraocular Movements: Extraocular movements intact.     Conjunctiva/sclera: Conjunctivae normal.     Pupils: Pupils are equal, round, and reactive to light.    Cardiovascular:     Rate and Rhythm: Normal rate.  Pulmonary:     Effort: Pulmonary effort is normal.   Neurological:     General: No focal deficit present.     Mental Status: She is alert and oriented to person, place, and time. Mental status is at baseline.   Psychiatric:        Mood and Affect: Mood normal.        Behavior: Behavior normal.        Thought Content: Thought content normal.        Judgment: Judgment normal.       Assessment & Plan Hypertension associated with diabetes (HCC) Blood pressure elevated today, will reassess at follow up to determine if modifications to POC are needed.  - CMP14+EGFR - CBC with Diff  Type 2 diabetes mellitus with diabetic neuropathy, without long-term current use of insulin (HCC) Checking labs today. Will call pt. With results  Continue current diabetes POC, as patient has been well controlled on current regimen.  Will adjust meds if needed based on labs.   -Hemoglobin A1C Hyperlipidemia associated with type 2 diabetes mellitus (HCC) Checking labs today.  Continue current therapy for lipid control. Will modify as needed based on labwork results.   -Lipid Panel B12 deficiency due to diet Vitamin D  deficiency, unspecified Other fatigue Checking labs today.  Will continue supplements as needed.   -Vitamin D  -Vitamin B12 -TSH   Return in about 2 weeks (around  10/22/2023) for F/U.   Total time spent: 30 minutes  ALAN CHRISTELLA ARRANT, FNP  10/08/2023  This document may have been prepared by [facility] Voice  Recognition software and as such may include unintentional dictation errors.

## 2023-10-09 LAB — CBC WITH DIFFERENTIAL/PLATELET
Basophils Absolute: 0.1 10*3/uL (ref 0.0–0.2)
Basos: 1 %
EOS (ABSOLUTE): 0.3 10*3/uL (ref 0.0–0.4)
Eos: 5 %
Hematocrit: 41.7 % (ref 34.0–46.6)
Hemoglobin: 13.9 g/dL (ref 11.1–15.9)
Immature Grans (Abs): 0 10*3/uL (ref 0.0–0.1)
Immature Granulocytes: 0 %
Lymphocytes Absolute: 1.8 10*3/uL (ref 0.7–3.1)
Lymphs: 28 %
MCH: 31 pg (ref 26.6–33.0)
MCHC: 33.3 g/dL (ref 31.5–35.7)
MCV: 93 fL (ref 79–97)
Monocytes Absolute: 0.6 10*3/uL (ref 0.1–0.9)
Monocytes: 9 %
Neutrophils Absolute: 3.6 10*3/uL (ref 1.4–7.0)
Neutrophils: 57 %
Platelets: 271 10*3/uL (ref 150–450)
RBC: 4.49 x10E6/uL (ref 3.77–5.28)
RDW: 12.8 % (ref 11.7–15.4)
WBC: 6.3 10*3/uL (ref 3.4–10.8)

## 2023-10-09 LAB — VITAMIN D 25 HYDROXY (VIT D DEFICIENCY, FRACTURES): Vit D, 25-Hydroxy: 55.8 ng/mL (ref 30.0–100.0)

## 2023-10-09 LAB — LIPID PANEL
Chol/HDL Ratio: 2.5 ratio (ref 0.0–4.4)
Cholesterol, Total: 175 mg/dL (ref 100–199)
HDL: 71 mg/dL (ref >39)
LDL Chol Calc (NIH): 76 mg/dL (ref 0–99)
Triglycerides: 170 mg/dL — ABNORMAL HIGH (ref 0–149)
VLDL Cholesterol Cal: 28 mg/dL (ref 5–40)

## 2023-10-09 LAB — CMP14+EGFR
ALT: 14 IU/L (ref 0–32)
AST: 21 IU/L (ref 0–40)
Albumin: 4.2 g/dL (ref 3.7–4.7)
Alkaline Phosphatase: 95 IU/L (ref 44–121)
BUN/Creatinine Ratio: 17 (ref 12–28)
BUN: 10 mg/dL (ref 8–27)
Bilirubin Total: 0.5 mg/dL (ref 0.0–1.2)
CO2: 24 mmol/L (ref 20–29)
Calcium: 10 mg/dL (ref 8.7–10.3)
Chloride: 106 mmol/L (ref 96–106)
Creatinine, Ser: 0.59 mg/dL (ref 0.57–1.00)
Globulin, Total: 2.1 g/dL (ref 1.5–4.5)
Glucose: 94 mg/dL (ref 70–99)
Potassium: 4.4 mmol/L (ref 3.5–5.2)
Sodium: 142 mmol/L (ref 134–144)
Total Protein: 6.3 g/dL (ref 6.0–8.5)
eGFR: 90 mL/min/{1.73_m2} (ref >59)

## 2023-10-09 LAB — HEMOGLOBIN A1C
Est. average glucose Bld gHb Est-mCnc: 160 mg/dL
Hgb A1c MFr Bld: 7.2 % — ABNORMAL HIGH (ref 4.8–5.6)

## 2023-10-09 LAB — VITAMIN B12: Vitamin B-12: 755 pg/mL (ref 232–1245)

## 2023-10-09 LAB — TSH: TSH: 1.61 u[IU]/mL (ref 0.450–4.500)

## 2023-10-16 DIAGNOSIS — Z599 Problem related to housing and economic circumstances, unspecified: Secondary | ICD-10-CM | POA: Diagnosis not present

## 2023-10-16 DIAGNOSIS — R200 Anesthesia of skin: Secondary | ICD-10-CM | POA: Diagnosis not present

## 2023-10-16 DIAGNOSIS — M79605 Pain in left leg: Secondary | ICD-10-CM | POA: Diagnosis not present

## 2023-10-16 DIAGNOSIS — R251 Tremor, unspecified: Secondary | ICD-10-CM | POA: Diagnosis not present

## 2023-10-22 ENCOUNTER — Ambulatory Visit (INDEPENDENT_AMBULATORY_CARE_PROVIDER_SITE_OTHER): Admitting: Family

## 2023-10-22 ENCOUNTER — Encounter: Payer: Self-pay | Admitting: Family

## 2023-10-22 VITALS — BP 116/50 | HR 51 | Ht 61.0 in | Wt 162.8 lb

## 2023-10-22 DIAGNOSIS — G6289 Other specified polyneuropathies: Secondary | ICD-10-CM

## 2023-10-22 DIAGNOSIS — E1140 Type 2 diabetes mellitus with diabetic neuropathy, unspecified: Secondary | ICD-10-CM | POA: Diagnosis not present

## 2023-10-22 DIAGNOSIS — M5116 Intervertebral disc disorders with radiculopathy, lumbar region: Secondary | ICD-10-CM | POA: Diagnosis not present

## 2023-10-22 DIAGNOSIS — Z0130 Encounter for examination of blood pressure without abnormal findings: Secondary | ICD-10-CM

## 2023-10-22 MED ORDER — ALPHA-LIPOIC ACID 300 MG PO CAPS: 2.0000 | ORAL_CAPSULE | Freq: Every day | ORAL | 3 refills | Status: DC

## 2023-10-22 NOTE — Assessment & Plan Note (Signed)
 Patient stable.  Well controlled with current therapy.   Continue current meds.

## 2023-10-22 NOTE — Progress Notes (Signed)
 Established Patient Office Visit  Subjective:  Patient ID: Kathleen Tran, female    DOB: Apr 13, 1941  Age: 83 y.o. MRN: [id number]  Chief Complaint  Patient presents with   Follow-up    2 week follow up    Pt. Here today for 2 week n/p follow up.   Had labs done at that time, so we will review in detail today.  Labs: A1C slightly elevated at 7.2, Triglycerides also slightly elevated, likely as these were done in an afternoon and were probably not drawn fasting.  Her only concern is the neuropathy she is experiencing in her legs and feet.  Says that they will feel "tight" at times.   No other concerns today.      No other concerns at this time.   Past Medical History:  Diagnosis Date   Acute pain of right shoulder 07/26/2022   Allergy don't know   I was a teen ager.   Arthritis    Cataract    Diabetes mellitus without complication (HCC)    GERD (gastroesophageal reflux disease)    Hepatitis    at age 68   History of herniated intervertebral disc    lower back   History of hiatal hernia    Hyperlipidemia    Hypertension    Osteoporosis    Peripheral edema    Tremors of nervous system    HEAD    Past Surgical History:  Procedure Laterality Date   APPENDECTOMY     BILATERAL CARPAL TUNNEL RELEASE Right 11/29/2021   Procedure: CARPAL TUNNEL RELEASE;  Surgeon: Marlynn Singer, MD;  Location: [facility];  Service: Orthopedics;  Laterality: Right;   CATARACT EXTRACTION W/PHACO Left 09/11/2018   Procedure: CATARACT EXTRACTION PHACO AND INTRAOCULAR LENS PLACEMENT (IOC) LEFT, DIABETIC;  Surgeon: Annell Kidney, MD;  Location: [facility];  Service: Ophthalmology;  Laterality: Left;  US   01:03 AP% 7.9 CDE 6.89 Fluid pack lot # [id number]   CATARACT EXTRACTION W/PHACO Right 02/11/2019   Procedure: CATARACT EXTRACTION PHACO AND INTRAOCULAR LENS PLACEMENT (IOC) RIGHT, DIABETIC;  Surgeon: Annell Kidney, MD;  Location: [facility];  Service: Ophthalmology;  Laterality:  Right;  US  00:51 AP% 11.6 CDE 8.20 fluid pack lot # [id number]   COLONOSCOPY WITH PROPOFOL  N/A 09/26/2017   Procedure: COLONOSCOPY WITH PROPOFOL ;  Surgeon: Irby Mannan, MD;  Location: [facility];  Service: Endoscopy;  Laterality: N/A;   COLONOSCOPY WITH PROPOFOL  N/A 10/16/2019   Procedure: COLONOSCOPY WITH PROPOFOL ;  Surgeon: Marnee Sink, MD;  Location: [facility];  Service: Endoscopy;  Laterality: N/A;   CYST EXCISION     "base of spine"   EYE SURGERY Left    cataract extraction   JOINT REPLACEMENT     PARTIAL HIP ARTHROPLASTY Right    TONSILLECTOMY     TOTAL HIP ARTHROPLASTY Left 01/08/2019   Procedure: TOTAL HIP ARTHROPLASTY ANTERIOR APPROACH;  Surgeon: Jerlyn Moons, MD;  Location: [facility];  Service: Orthopedics;  Laterality: Left;   TUBAL LIGATION      Social History   Socioeconomic History   Marital status: Married    Spouse name: Sammie Crigler ...son   Number of children: 6   Years of education: Not on file   Highest education level: GED or equivalent  Occupational History   Occupation: retired  Tobacco Use   Smoking status: Never   Smokeless tobacco: Never  Vaping Use   Vaping status: Never Used  Substance and Sexual Activity   Alcohol use: No   Drug use: No  Sexual activity: Not Currently    Birth control/protection: None    Comment: Don't need it  Other Topics Concern   Not on file  Social History Narrative   Not on file   Social Drivers of Health   Financial Resource Strain: Medium Risk (10/16/2023)   Received from [facility]   Overall Financial Resource Strain (CARDIA)    Difficulty of Paying Living Expenses: Somewhat hard  Food Insecurity: Food Insecurity Present (10/16/2023)   Received from [facility]   Hunger Vital Sign    Worried About Running Out of Food in the Last Year: Sometimes true    Ran Out of Food in the Last Year: Sometimes true  Transportation Needs: No Transportation Needs  (10/16/2023)   Received from [facility] - Transportation    In the past 12 months, has lack of transportation kept you from medical appointments or from getting medications?: No    Lack of Transportation (Non-Medical): No  Physical Activity: Insufficiently Active (11/18/2022)   Exercise Vital Sign    Days of Exercise per Week: 1 day    Minutes of Exercise per Session: 30 min  Stress: No Stress Concern Present (11/18/2022)   [employer] of Occupational Health - Occupational Stress Questionnaire    Feeling of Stress : Not at all  Social Connections: Moderately Integrated (11/18/2022)   Social Connection and Isolation Panel [NHANES]    Frequency of Communication with Friends and Family: Once a week    Frequency of Social Gatherings with Friends and Family: Once a week    Attends Religious Services: More than 4 times per year    Active Member of [employer] or Organizations: Yes    Attends Engineer, structural: More than 4 times per year    Marital Status: Married  Catering manager Violence: Not At Risk (11/13/2022)   Humiliation, Afraid, Rape, and Kick questionnaire    Fear of Current or Ex-Partner: No    Emotionally Abused: No    Physically Abused: No    Sexually Abused: No    Family History  Problem Relation Age of Onset   Early death Mother    Heart disease Maternal Grandfather    Cancer Maternal Grandmother    Asthma Daughter    Diabetes Son    Hypertension Sister    Varicose Veins Sister     Allergies  Allergen Reactions   Citrus Other (See Comments)    Rash in mouth  Rash in mouth    Review of Systems  Neurological:  Positive for sensory change.  All other systems reviewed and are negative.      Objective:   BP (!) 116/50   Pulse (!) 51   Ht 5\' 1"  (1.549 m)   Wt 162 lb 12.8 oz (73.8 kg)   SpO2 94%   BMI 30.76 kg/m   Vitals:   10/22/23 1300  BP: (!) 116/50  Pulse: (!) 51  Height: 5\' 1"  (1.549 m)  Weight: 162 lb  12.8 oz (73.8 kg)  SpO2: 94%  BMI (Calculated): 30.78    Physical Exam Vitals and nursing note reviewed.  Constitutional:      Appearance: Normal appearance. She is normal weight.  HENT:     Head: Normocephalic.  Eyes:     Extraocular Movements: Extraocular movements intact.     Conjunctiva/sclera: Conjunctivae normal.     Pupils: Pupils are equal, round, and reactive to light.  Cardiovascular:  Rate and Rhythm: Normal rate.  Pulmonary:     Effort: Pulmonary effort is normal.  Neurological:     General: No focal deficit present.     Mental Status: She is alert and oriented to person, place, and time. Mental status is at baseline.  Psychiatric:        Mood and Affect: Mood normal.        Behavior: Behavior normal.        Thought Content: Thought content normal.        Judgment: Judgment normal.      No results found for any visits on 10/22/23.  Recent Results (from the past 2160 hours)  Lipid panel     Status: Abnormal   Collection Time: 10/08/23  2:25 PM  Result Value Ref Range   Cholesterol, Total 175 100 - 199 mg/dL   Triglycerides 440 (H) 0 - 149 mg/dL   HDL 71 >34 mg/dL   VLDL Cholesterol Cal 28 5 - 40 mg/dL   LDL Chol Calc (NIH) 76 0 - 99 mg/dL   Chol/HDL Ratio 2.5 0.0 - 4.4 ratio    Comment:                                   T. Chol/HDL Ratio                                             Men  Women                               1/2 Avg.Risk  3.4    3.3                                   Avg.Risk  5.0    4.4                                2X Avg.Risk  9.6    7.1                                3X Avg.Risk 23.4   11.0   VITAMIN D  25 Hydroxy (Vit-D Deficiency, Fractures)     Status: None   Collection Time: 10/08/23  2:25 PM  Result Value Ref Range   Vit D, 25-Hydroxy 55.8 30.0 - 100.0 ng/mL    Comment: Vitamin D  deficiency has been defined by the Institute of Medicine and an Endocrine Society practice guideline as a level of serum 25-OH vitamin D  less than 20  ng/mL (1,2). The Endocrine Society went on to further define vitamin D  insufficiency as a level between 21 and 29 ng/mL (2). 1. IOM (Institute of Medicine). 2010. Dietary reference    intakes for calcium  and D. [place]: The    [employer]. 2. Holick MF, Binkley Emory, Bischoff-Ferrari HA, et al.    Evaluation, treatment, and prevention of vitamin D     deficiency: an Endocrine Society clinical practice    guideline. JCEM. 2011 Jul; 96(7):1911-30.   CMP14+EGFR     Status: None   Collection  Time: 10/08/23  2:25 PM  Result Value Ref Range   Glucose 94 70 - 99 mg/dL   BUN 10 8 - 27 mg/dL   Creatinine, Ser 1.61 0.57 - 1.00 mg/dL   eGFR 90 >09 UE/AVW/0.98   BUN/Creatinine Ratio 17 12 - 28   Sodium 142 134 - 144 mmol/L   Potassium 4.4 3.5 - 5.2 mmol/L   Chloride 106 96 - 106 mmol/L   CO2 24 20 - 29 mmol/L   Calcium  10.0 8.7 - 10.3 mg/dL   Total Protein 6.3 6.0 - 8.5 g/dL   Albumin 4.2 3.7 - 4.7 g/dL   Globulin, Total 2.1 1.5 - 4.5 g/dL   Bilirubin Total 0.5 0.0 - 1.2 mg/dL   Alkaline Phosphatase 95 44 - 121 IU/L   AST 21 0 - 40 IU/L   ALT 14 0 - 32 IU/L  TSH     Status: None   Collection Time: 10/08/23  2:25 PM  Result Value Ref Range   TSH 1.610 0.450 - 4.500 uIU/mL  Hemoglobin A1c     Status: Abnormal   Collection Time: 10/08/23  2:25 PM  Result Value Ref Range   Hgb A1c MFr Bld 7.2 (H) 4.8 - 5.6 %    Comment:          Prediabetes: 5.7 - 6.4          Diabetes: >6.4          Glycemic control for adults with diabetes: <7.0    Est. average glucose Bld gHb Est-mCnc 160 mg/dL  Vitamin B12     Status: None   Collection Time: 10/08/23  2:25 PM  Result Value Ref Range   Vitamin B-12 755 232 - 1,245 pg/mL  CBC with Diff     Status: None   Collection Time: 10/08/23  2:25 PM  Result Value Ref Range   WBC 6.3 3.4 - 10.8 x10E3/uL   RBC 4.49 3.77 - 5.28 x10E6/uL   Hemoglobin 13.9 11.1 - 15.9 g/dL   Hematocrit 11.9 14.7 - 46.6 %   MCV 93 79 - 97 fL   MCH 31.0 26.6 -  33.0 pg   MCHC 33.3 31.5 - 35.7 g/dL   RDW 82.9 56.2 - 13.0 %   Platelets 271 150 - 450 x10E3/uL   Neutrophils 57 Not Estab. %   Lymphs 28 Not Estab. %   Monocytes 9 Not Estab. %   Eos 5 Not Estab. %   Basos 1 Not Estab. %   Neutrophils Absolute 3.6 1.4 - 7.0 x10E3/uL   Lymphocytes Absolute 1.8 0.7 - 3.1 x10E3/uL   Monocytes Absolute 0.6 0.1 - 0.9 x10E3/uL   EOS (ABSOLUTE) 0.3 0.0 - 0.4 x10E3/uL   Basophils Absolute 0.1 0.0 - 0.2 x10E3/uL   Immature Granulocytes 0 Not Estab. %   Immature Grans (Abs) 0.0 0.0 - 0.1 x10E3/uL       Assessment & Plan:   Problem List Items Addressed This Visit       Endocrine   Type 2 diabetes mellitus with diabetic neuropathy, without long-term current use of insulin (HCC) - Primary   Patient stable.  Well controlled with current therapy.   Continue current meds.          Nervous and Auditory   Radiculopathy due to lumbar intervertebral disc disorder   Other Visit Diagnoses       Other polyneuropathy       Suggested pt pick up alpha-lipoic acid (per neuro) to try and  see if this helps. Counseled that this may take a bit to work. She will let me know if this works       Return in about 4 months (around 02/21/2024) for F/U.   Total time spent: 20 minutes  Trenda Frisk, FNP  10/22/2023   This document may have been prepared by [facility] Voice Recognition software and as such may include unintentional dictation errors.

## 2023-10-31 ENCOUNTER — Telehealth: Payer: Self-pay | Admitting: Family

## 2023-10-31 NOTE — Telephone Encounter (Signed)
 Patient left VM to let Mylinda Asa know that she cannot take the alpha acid, it is making her sick. She also states that she is having a reaction to Jardiance  but did not state what kind of reaction.   Need to call and see what kind of reaction the Jardiance  is causing.

## 2023-11-02 MED ORDER — FLUCONAZOLE 150 MG PO TABS: 150.0000 mg | ORAL_TABLET | ORAL | 0 refills | Status: AC

## 2023-11-02 MED ORDER — FARXIGA 10 MG PO TABS: 10.0000 mg | ORAL_TABLET | Freq: Every day | ORAL | 1 refills | Status: DC

## 2023-11-16 ENCOUNTER — Other Ambulatory Visit: Payer: Self-pay | Admitting: Physician Assistant

## 2023-11-16 DIAGNOSIS — E7800 Pure hypercholesterolemia, unspecified: Secondary | ICD-10-CM

## 2023-11-21 DIAGNOSIS — M9933 Osseous stenosis of neural canal of lumbar region: Secondary | ICD-10-CM | POA: Diagnosis not present

## 2023-11-21 DIAGNOSIS — G629 Polyneuropathy, unspecified: Secondary | ICD-10-CM | POA: Diagnosis not present

## 2023-11-21 DIAGNOSIS — M4807 Spinal stenosis, lumbosacral region: Secondary | ICD-10-CM | POA: Diagnosis not present

## 2023-11-23 ENCOUNTER — Other Ambulatory Visit: Payer: Self-pay | Admitting: Orthopedic Surgery

## 2023-11-23 DIAGNOSIS — M4807 Spinal stenosis, lumbosacral region: Secondary | ICD-10-CM

## 2023-11-30 ENCOUNTER — Ambulatory Visit
Admission: RE | Admit: 2023-11-30 | Discharge: 2023-11-30 | Disposition: A | Discharge status: Home / Self Care | Source: Ambulatory Visit | Attending: Orthopedic Surgery | Admitting: Orthopedic Surgery

## 2023-11-30 DIAGNOSIS — M48061 Spinal stenosis, lumbar region without neurogenic claudication: Secondary | ICD-10-CM | POA: Diagnosis not present

## 2023-11-30 DIAGNOSIS — M4807 Spinal stenosis, lumbosacral region: Secondary | ICD-10-CM | POA: Diagnosis not present

## 2023-11-30 DIAGNOSIS — M47816 Spondylosis without myelopathy or radiculopathy, lumbar region: Secondary | ICD-10-CM | POA: Diagnosis not present

## 2023-11-30 DIAGNOSIS — M5136 Other intervertebral disc degeneration, lumbar region with discogenic back pain only: Secondary | ICD-10-CM | POA: Diagnosis not present

## 2023-12-03 DIAGNOSIS — R202 Paresthesia of skin: Secondary | ICD-10-CM | POA: Diagnosis not present

## 2023-12-03 DIAGNOSIS — R200 Anesthesia of skin: Secondary | ICD-10-CM | POA: Diagnosis not present

## 2023-12-23 ENCOUNTER — Encounter: Payer: Self-pay | Admitting: Family

## 2023-12-23 NOTE — Assessment & Plan Note (Signed)
 Checking labs today. Will call pt. With results  Continue current diabetes POC, as patient has been well controlled on current regimen.  Will adjust meds if needed based on labs.   -Hemoglobin A1C

## 2023-12-23 NOTE — Assessment & Plan Note (Signed)
 Checking labs today.  Continue current therapy for lipid control. Will modify as needed based on labwork results.   -Lipid Panel

## 2023-12-23 NOTE — Assessment & Plan Note (Signed)
 Blood pressure elevated today, will reassess at follow up to determine if modifications to POC are needed.  - CMP14+EGFR - CBC with Diff

## 2024-02-21 ENCOUNTER — Ambulatory Visit: Admitting: Family

## 2024-03-04 ENCOUNTER — Ambulatory Visit (INDEPENDENT_AMBULATORY_CARE_PROVIDER_SITE_OTHER): Admitting: Family

## 2024-03-04 ENCOUNTER — Encounter: Payer: Self-pay | Admitting: Family

## 2024-03-04 VITALS — BP 128/66 | HR 62 | Ht 61.0 in | Wt 163.2 lb

## 2024-03-04 DIAGNOSIS — E1169 Type 2 diabetes mellitus with other specified complication: Secondary | ICD-10-CM | POA: Diagnosis not present

## 2024-03-04 DIAGNOSIS — M5116 Intervertebral disc disorders with radiculopathy, lumbar region: Secondary | ICD-10-CM | POA: Diagnosis not present

## 2024-03-04 DIAGNOSIS — I152 Hypertension secondary to endocrine disorders: Secondary | ICD-10-CM | POA: Diagnosis not present

## 2024-03-04 DIAGNOSIS — M65341 Trigger finger, right ring finger: Secondary | ICD-10-CM | POA: Diagnosis not present

## 2024-03-04 DIAGNOSIS — E785 Hyperlipidemia, unspecified: Secondary | ICD-10-CM

## 2024-03-04 DIAGNOSIS — E538 Deficiency of other specified B group vitamins: Secondary | ICD-10-CM

## 2024-03-04 DIAGNOSIS — E1159 Type 2 diabetes mellitus with other circulatory complications: Secondary | ICD-10-CM

## 2024-03-04 DIAGNOSIS — E1140 Type 2 diabetes mellitus with diabetic neuropathy, unspecified: Secondary | ICD-10-CM | POA: Diagnosis not present

## 2024-03-04 DIAGNOSIS — G6289 Other specified polyneuropathies: Secondary | ICD-10-CM

## 2024-03-04 DIAGNOSIS — N6324 Unspecified lump in the left breast, lower inner quadrant: Secondary | ICD-10-CM | POA: Diagnosis not present

## 2024-03-04 DIAGNOSIS — R5383 Other fatigue: Secondary | ICD-10-CM | POA: Diagnosis not present

## 2024-03-04 DIAGNOSIS — E559 Vitamin D deficiency, unspecified: Secondary | ICD-10-CM

## 2024-03-04 LAB — POC CREATINE & ALBUMIN,URINE
Albumin/Creatinine Ratio, Urine, POC: <30
Creatinine, POC: 200 mg/dL
Microalbumin Ur, POC: 30 mg/L

## 2024-03-04 LAB — POCT CBG (FASTING - GLUCOSE)-MANUAL ENTRY: Glucose Fasting, POC: 173 mg/dL — AB (ref 70–99)

## 2024-03-04 NOTE — Assessment & Plan Note (Signed)
 Checking labs today.  Continue current therapy for lipid control. Will modify as needed based on labwork results.   -CMP w/eGFR -Lipid Panel

## 2024-03-04 NOTE — Assessment & Plan Note (Signed)
 Checking labs today. Will call pt. With results  Continue current diabetes POC, as patient has been well controlled on current regimen.  Will adjust meds if needed based on labs.   -CBC w/Diff -CMP w/eGFR -Hemoglobin A1C

## 2024-03-04 NOTE — Assessment & Plan Note (Signed)
 Blood pressure well controlled with current medications.  Continue current therapy.  Will reassess at follow up.   - CBC w/Diff - CMP w/eGFR

## 2024-03-04 NOTE — Progress Notes (Signed)
 Established Patient Office Visit  Subjective:  Patient ID: Kathleen Tran, female    DOB: 1941-01-05  Age: 83 y.o. MRN: [id number]  Chief Complaint  Patient presents with   Follow-up    4 month follow up    Patient is here today for her 3 months follow up.  She has been feeling fairly well since last appointment.   She does have additional concerns to discuss today.  1) Has a lump under her left breast, says that it has been there for a while, but feels like it might have grown some.   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  She doesn't feel the pain that often in her back, she says only when she is bending a lot or vacuuming.  3) Her left ring finger, has been having it lock up on her.  Says she had the same thing happen on the right previously,   Labs are due today.  She needs refills.   I have reviewed her active problem list, medication list, allergies, notes from last encounter for her appointment today.      No other concerns at this time.   Past Medical History:  Diagnosis Date   Acute pain of right shoulder 07/26/2022   Allergy don't know   I was a teen ager.   Arthritis    Cataract    Diabetes mellitus without complication (HCC)    GERD (gastroesophageal reflux disease)    Hepatitis    at age 28   History of herniated intervertebral disc    lower back   History of hiatal hernia    Hyperlipidemia    Hypertension    Osteoporosis    Peripheral edema    Tremors of nervous system    HEAD    Past Surgical History:  Procedure Laterality Date   APPENDECTOMY     BILATERAL CARPAL TUNNEL RELEASE Right 11/29/2021   Procedure: CARPAL TUNNEL RELEASE;  Surgeon: Cleotilde Barrio, MD;  Location: [facility];  Service: Orthopedics;  Laterality: Right;   CATARACT EXTRACTION W/PHACO Left 09/11/2018   Procedure: CATARACT EXTRACTION PHACO AND INTRAOCULAR LENS PLACEMENT (IOC) LEFT, DIABETIC;  Surgeon: Mittie Gaskin, MD;   Location: [facility];  Service: Ophthalmology;  Laterality: Left;  US   01:03 AP% 7.9 CDE 6.89 Fluid pack lot # [id number]   CATARACT EXTRACTION W/PHACO Right 02/11/2019   Procedure: CATARACT EXTRACTION PHACO AND INTRAOCULAR LENS PLACEMENT (IOC) RIGHT, DIABETIC;  Surgeon: Mittie Gaskin, MD;  Location: [facility];  Service: Ophthalmology;  Laterality: Right;  US  00:51 AP% 11.6 CDE 8.20 fluid pack lot # [id number]   COLONOSCOPY WITH PROPOFOL  N/A 09/26/2017   Procedure: COLONOSCOPY WITH PROPOFOL ;  Surgeon: Janalyn Keene NOVAK, MD;  Location: [facility] END[facility]COPY;  Service: Endoscopy;  Laterality: N/A;   COLONOSCOPY WITH PROPOFOL  N/A 10/16/2019   Procedure: COLONOSCOPY WITH PROPOFOL ;  Surgeon: Jinny Carmine, MD;  Location: [facility]COPY;  Service: Endoscopy;  Laterality: N/A;   CYST EXCISION     base of spine   EYE SURGERY Left    cataract extraction   JOINT REPLACEMENT     PARTIAL HIP ARTHROPLASTY Right    TONSILLECTOMY     TOTAL HIP ARTHROPLASTY Left 01/08/2019   Procedure: TOTAL HIP ARTHROPLASTY ANTERIOR APPROACH;  Surgeon: Leora Lynwood SAUNDERS, MD;  Location: [facility];  Service: Orthopedics;  Laterality: Left;   TUBAL LIGATION      Social History   Socioeconomic History   Marital status: Married  Spouse name: elsie ...son   Number of children: 6   Years of education: Not on file   Highest education level: GED or equivalent  Occupational History   Occupation: retired  Tobacco Use   Smoking status: Never   Smokeless tobacco: Never  Vaping Use   Vaping status: Never Used  Substance and Sexual Activity   Alcohol use: No   Drug use: No   Sexual activity: Not Currently    Birth control/protection: None    Comment: Don't need it  Other Topics Concern   Not on file  Social History Narrative   Not on file   Social Drivers of Health   Financial Resource Strain: Low Risk  (11/21/2023)   Received from [facility]   Overall Financial Resource Strain (CARDIA)     Difficulty of Paying Living Expenses: Not very hard  Recent Concern: Financial Resource Strain - Medium Risk (10/16/2023)   Received from [facility]   Overall Financial Resource Strain (CARDIA)    Difficulty of Paying Living Expenses: Somewhat hard  Food Insecurity: Food Insecurity Present (11/21/2023)   Received from [facility]   Hunger Vital Sign    Within the past 12 months, you worried that your food would run out before you got the money to buy more.: Sometimes true    Within the past 12 months, the food you bought just didn't last and you didn't have money to get more.: Never true  Transportation Needs: No Transportation Needs (11/21/2023)   Received from [facility]    In the past 12 months, has lack of transportation kept you from medical appointments or from getting medications?: No    Lack of Transportation (Non-Medical): No  Physical Activity: Insufficiently Active (11/18/2022)   Exercise Vital Sign    Days of Exercise per Week: 1 day    Minutes of Exercise per Session: 30 min  Stress: No Stress Concern Present (11/18/2022)   [employer] of Occupational Health - Occupational Stress Questionnaire    Feeling of Stress : Not at all  Social Connections: Moderately Integrated (11/18/2022)   Social Connection and Isolation Panel    Frequency of Communication with Friends and Family: Once a week    Frequency of Social Gatherings with Friends and Family: Once a week    Attends Religious Services: More than 4 times per year    Active Member of [employer] or Organizations: Yes    Attends Engineer, structural: More than 4 times per year    Marital Status: Married  Catering manager Violence: Not At Risk (11/13/2022)   Humiliation, Afraid, Rape, and Kick questionnaire    Fear of Current or Ex-Partner: No    Emotionally Abused: No    Physically Abused: No    Sexually Abused: No    Family History   Problem Relation Age of Onset   Early death Mother    Heart disease Maternal Grandfather    Cancer Maternal Grandmother    Asthma Daughter    Diabetes Son    Hypertension Sister    Varicose Veins Sister     Allergies  Allergen Reactions   Citrus Other (See Comments)    Rash in mouth  Rash in mouth    Review of Systems  All other systems reviewed and are negative.      Objective:   BP 128/66   Pulse 62   Ht 5' 1 (1.549 m)  Wt 163 lb 3.2 oz (74 kg)   SpO2 96%   BMI 30.84 kg/m   Vitals:   03/04/24 1124  BP: 128/66  Pulse: 62  Height: 5' 1 (1.549 m)  Weight: 163 lb 3.2 oz (74 kg)  SpO2: 96%  BMI (Calculated): 30.85    Physical Exam Vitals and nursing note reviewed.  Constitutional:      Appearance: Normal appearance. She is normal weight.  HENT:     Head: Normocephalic.  Eyes:     Extraocular Movements: Extraocular movements intact.     Conjunctiva/sclera: Conjunctivae normal.     Pupils: Pupils are equal, round, and reactive to light.  Cardiovascular:     Rate and Rhythm: Normal rate.  Pulmonary:     Effort: Pulmonary effort is normal.  Neurological:     General: No focal deficit present.     Mental Status: She is alert and oriented to person, place, and time. Mental status is at baseline.  Psychiatric:        Mood and Affect: Mood normal.        Behavior: Behavior normal.        Thought Content: Thought content normal.      Results for orders placed or performed in visit on 03/04/24  POCT CBG (Fasting - Glucose)  Result Value Ref Range   Glucose Fasting, POC 173 (A) 70 - 99 mg/dL    Recent Results (from the past 2160 hours)  POCT CBG (Fasting - Glucose)     Status: Abnormal   Collection Time: 03/04/24 11:48 AM  Result Value Ref Range   Glucose Fasting, POC 173 (A) 70 - 99 mg/dL       Assessment & Plan Type 2 diabetes mellitus with diabetic neuropathy, without long-term current use of insulin (HCC) Checking labs today. Will call  pt. With results  Continue current diabetes POC, as patient has been well controlled on current regimen.  Will adjust meds if needed based on labs.   -CBC w/Diff -CMP w/eGFR -Hemoglobin A1C  Hypertension associated with diabetes (HCC) Blood pressure well controlled with current medications.  Continue current therapy.  Will reassess at follow up.   - CBC w/Diff - CMP w/eGFR  Hyperlipidemia associated with type 2 diabetes mellitus (HCC) Checking labs today.  Continue current therapy for lipid control. Will modify as needed based on labwork results.   -CMP w/eGFR -Lipid Panel  Vitamin D  deficiency, unspecified B12 deficiency due to diet Other fatigue Checking labs today.  Will continue supplements as needed.   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  She was offered injections but opted not to do these for now.  Will let them know if she changes her mind.   Trigger finger, right ring finger Setting up referral to Ortho for trigger finger.   Will defer to them for further treatment.   Breast lump on left side at 7 o'clock position Diagnostic mammogram ordered.  They will call to schedule.     Return in about 3 months (around 06/03/2024).   Total time spent: 20 minutes  ALAN CHRISTELLA ARRANT, FNP  03/04/2024   This document may have been prepared by [facility] Voice Recognition software and as such may include unintentional dictation errors.

## 2024-03-04 NOTE — Assessment & Plan Note (Signed)
 Neuro thinks this might be coming from her back.  She was offered injections but opted not to do these for now.  Will let them know if she changes her mind.

## 2024-03-05 LAB — CMP14+EGFR
ALT: 11 IU/L (ref 0–32)
AST: 16 IU/L (ref 0–40)
Albumin: 4.1 g/dL (ref 3.7–4.7)
Alkaline Phosphatase: 84 IU/L (ref 44–121)
BUN/Creatinine Ratio: 29 — ABNORMAL HIGH (ref 12–28)
BUN: 19 mg/dL (ref 8–27)
Bilirubin Total: 0.4 mg/dL (ref 0.0–1.2)
CO2: 24 mmol/L (ref 20–29)
Calcium: 10.1 mg/dL (ref 8.7–10.3)
Chloride: 103 mmol/L (ref 96–106)
Creatinine, Ser: 0.65 mg/dL (ref 0.57–1.00)
Globulin, Total: 2.2 g/dL (ref 1.5–4.5)
Glucose: 112 mg/dL — ABNORMAL HIGH (ref 70–99)
Potassium: 4.5 mmol/L (ref 3.5–5.2)
Sodium: 142 mmol/L (ref 134–144)
Total Protein: 6.3 g/dL (ref 6.0–8.5)
eGFR: 88 mL/min/1.73 (ref >59)

## 2024-03-05 LAB — CBC WITH DIFFERENTIAL/PLATELET
Basophils Absolute: 0.1 x10E3/uL (ref 0.0–0.2)
Basos: 1 %
EOS (ABSOLUTE): 0.4 x10E3/uL (ref 0.0–0.4)
Eos: 7 %
Hematocrit: 41.6 % (ref 34.0–46.6)
Hemoglobin: 13.7 g/dL (ref 11.1–15.9)
Immature Grans (Abs): 0 x10E3/uL (ref 0.0–0.1)
Immature Granulocytes: 0 %
Lymphocytes Absolute: 1.6 x10E3/uL (ref 0.7–3.1)
Lymphs: 27 %
MCH: 32 pg (ref 26.6–33.0)
MCHC: 32.9 g/dL (ref 31.5–35.7)
MCV: 97 fL (ref 79–97)
Monocytes Absolute: 0.6 x10E3/uL (ref 0.1–0.9)
Monocytes: 10 %
Neutrophils Absolute: 3.1 x10E3/uL (ref 1.4–7.0)
Neutrophils: 55 %
Platelets: 312 x10E3/uL (ref 150–450)
RBC: 4.28 x10E6/uL (ref 3.77–5.28)
RDW: 12.7 % (ref 11.7–15.4)
WBC: 5.7 x10E3/uL (ref 3.4–10.8)

## 2024-03-05 LAB — HEMOGLOBIN A1C
Est. average glucose Bld gHb Est-mCnc: 151 mg/dL
Hgb A1c MFr Bld: 6.9 % — ABNORMAL HIGH (ref 4.8–5.6)

## 2024-03-05 LAB — LIPID PANEL
Chol/HDL Ratio: 2.7 ratio (ref 0.0–4.4)
Cholesterol, Total: 173 mg/dL (ref 100–199)
HDL: 63 mg/dL (ref >39)
LDL Chol Calc (NIH): 73 mg/dL (ref 0–99)
Triglycerides: 228 mg/dL — ABNORMAL HIGH (ref 0–149)
VLDL Cholesterol Cal: 37 mg/dL (ref 5–40)

## 2024-03-05 LAB — VITAMIN D 25 HYDROXY (VIT D DEFICIENCY, FRACTURES): Vit D, 25-Hydroxy: 60.2 ng/mL (ref 30.0–100.0)

## 2024-03-05 LAB — VITAMIN B12: Vitamin B-12: 529 pg/mL (ref 232–1245)

## 2024-03-06 ENCOUNTER — Other Ambulatory Visit: Payer: Self-pay | Admitting: Family

## 2024-03-06 DIAGNOSIS — N6324 Unspecified lump in the left breast, lower inner quadrant: Secondary | ICD-10-CM

## 2024-03-12 ENCOUNTER — Ambulatory Visit
Admission: RE | Admit: 2024-03-12 | Discharge: 2024-03-12 | Disposition: A | Discharge status: Home / Self Care | Source: Ambulatory Visit | Attending: Family | Admitting: Family

## 2024-03-12 DIAGNOSIS — N6324 Unspecified lump in the left breast, lower inner quadrant: Secondary | ICD-10-CM

## 2024-03-12 DIAGNOSIS — R92313 Mammographic fatty tissue density, bilateral breasts: Secondary | ICD-10-CM | POA: Diagnosis not present

## 2024-03-18 ENCOUNTER — Other Ambulatory Visit: Payer: Self-pay

## 2024-03-18 DIAGNOSIS — M65342 Trigger finger, left ring finger: Secondary | ICD-10-CM | POA: Diagnosis not present

## 2024-03-18 DIAGNOSIS — E1159 Type 2 diabetes mellitus with other circulatory complications: Secondary | ICD-10-CM

## 2024-03-18 DIAGNOSIS — E7800 Pure hypercholesterolemia, unspecified: Secondary | ICD-10-CM

## 2024-03-18 DIAGNOSIS — E1140 Type 2 diabetes mellitus with diabetic neuropathy, unspecified: Secondary | ICD-10-CM

## 2024-03-18 MED ORDER — PRAVASTATIN SODIUM 80 MG PO TABS: ORAL_TABLET | ORAL | 3 refills | Status: AC

## 2024-03-18 MED ORDER — LOSARTAN POTASSIUM 25 MG PO TABS: 25.0000 mg | ORAL_TABLET | Freq: Every day | ORAL | 1 refills | Status: AC

## 2024-03-31 ENCOUNTER — Ambulatory Visit: Payer: Self-pay

## 2024-04-07 ENCOUNTER — Other Ambulatory Visit: Payer: Self-pay | Admitting: Family

## 2024-05-27 ENCOUNTER — Encounter: Payer: Self-pay | Admitting: Family

## 2024-06-02 ENCOUNTER — Other Ambulatory Visit: Payer: Self-pay | Admitting: Family

## 2024-06-03 ENCOUNTER — Encounter: Payer: Self-pay | Admitting: Family

## 2024-06-03 ENCOUNTER — Ambulatory Visit: Admitting: Family

## 2024-06-03 VITALS — BP 108/64 | HR 120 | Ht 61.0 in | Wt 166.8 lb

## 2024-06-03 DIAGNOSIS — E1140 Type 2 diabetes mellitus with diabetic neuropathy, unspecified: Secondary | ICD-10-CM

## 2024-06-03 DIAGNOSIS — Z13820 Encounter for screening for osteoporosis: Secondary | ICD-10-CM

## 2024-06-03 DIAGNOSIS — R5383 Other fatigue: Secondary | ICD-10-CM

## 2024-06-03 DIAGNOSIS — E559 Vitamin D deficiency, unspecified: Secondary | ICD-10-CM

## 2024-06-03 DIAGNOSIS — E1169 Type 2 diabetes mellitus with other specified complication: Secondary | ICD-10-CM

## 2024-06-03 DIAGNOSIS — G709 Myoneural disorder, unspecified: Secondary | ICD-10-CM | POA: Insufficient documentation

## 2024-06-03 DIAGNOSIS — E538 Deficiency of other specified B group vitamins: Secondary | ICD-10-CM

## 2024-06-03 DIAGNOSIS — E1159 Type 2 diabetes mellitus with other circulatory complications: Secondary | ICD-10-CM

## 2024-06-03 LAB — POCT CBG (FASTING - GLUCOSE)-MANUAL ENTRY: Glucose Fasting, POC: 114 mg/dL — AB (ref 70–99)

## 2024-06-03 NOTE — Progress Notes (Unsigned)
 Established Patient Office Visit  Subjective:  Patient ID: Kathleen Tran, female    DOB: 05/14/1941  Age: 83 y.o. MRN: [id number]  Chief Complaint  Patient presents with   Follow-up    3 month follow up    Patient is here today for her 3 months follow up.  She has been feeling well since last appointment.   She does not have additional concerns to discuss today.  Labs are due today.  She needs refills.   I have reviewed her active problem list, medication list, allergies, notes from last encounter, lab results for her appointment today.      No other concerns at this time.   Past Medical History:  Diagnosis Date   Acute pain of right shoulder 07/26/2022   Allergy don't know   I was a teen ager.   Arthritis    Cataract    Diabetes mellitus without complication (HCC)    GERD (gastroesophageal reflux disease)    Hepatitis    at age 4   History of herniated intervertebral disc    lower back   History of hiatal hernia    Hyperlipidemia    Hypertension    Osteoporosis    Peripheral edema    Tremors of nervous system    HEAD    Past Surgical History:  Procedure Laterality Date   APPENDECTOMY     BILATERAL CARPAL TUNNEL RELEASE Right 11/29/2021   Procedure: CARPAL TUNNEL RELEASE;  Surgeon: Cleotilde Barrio, MD;  Location: [facility];  Service: Orthopedics;  Laterality: Right;   CATARACT EXTRACTION W/PHACO Left 09/11/2018   Procedure: CATARACT EXTRACTION PHACO AND INTRAOCULAR LENS PLACEMENT (IOC) LEFT, DIABETIC;  Surgeon: Mittie Gaskin, MD;  Location: [facility];  Service: Ophthalmology;  Laterality: Left;  US   01:03 AP% 7.9 CDE 6.89 Fluid pack lot # [id number]   CATARACT EXTRACTION W/PHACO Right 02/11/2019   Procedure: CATARACT EXTRACTION PHACO AND INTRAOCULAR LENS PLACEMENT (IOC) RIGHT, DIABETIC;  Surgeon: Mittie Gaskin, MD;  Location: [facility];  Service: Ophthalmology;  Laterality: Right;  US  00:51 AP% 11.6 CDE 8.20 fluid pack lot # [id number]    COLONOSCOPY WITH PROPOFOL  N/A 09/26/2017   Procedure: COLONOSCOPY WITH PROPOFOL ;  Surgeon: Janalyn Keene NOVAK, MD;  Location: [facility];  Service: Endoscopy;  Laterality: N/A;   COLONOSCOPY WITH PROPOFOL  N/A 10/16/2019   Procedure: COLONOSCOPY WITH PROPOFOL ;  Surgeon: Jinny Carmine, MD;  Location: [facility];  Service: Endoscopy;  Laterality: N/A;   CYST EXCISION     base of spine   EYE SURGERY Left    cataract extraction   JOINT REPLACEMENT     PARTIAL HIP ARTHROPLASTY Right    TONSILLECTOMY     TOTAL HIP ARTHROPLASTY Left 01/08/2019   Procedure: TOTAL HIP ARTHROPLASTY ANTERIOR APPROACH;  Surgeon: Leora Lynwood SAUNDERS, MD;  Location: [facility];  Service: Orthopedics;  Laterality: Left;   TUBAL LIGATION      Social History   Socioeconomic History   Marital status: Married    Spouse name: elsie ...son   Number of children: 6   Years of education: Not on file   Highest education level: GED or equivalent  Occupational History   Occupation: retired  Tobacco Use   Smoking status: Never   Smokeless tobacco: Never  Vaping Use   Vaping status: Never Used  Substance and Sexual Activity   Alcohol use: No   Drug use: No   Sexual activity: Not Currently    Birth control/protection: None    Comment: Don't need  it  Other Topics Concern   Not on file  Social History Narrative   Not on file   Social Drivers of Health   Financial Resource Strain: Low Risk  (11/21/2023)   Received from [facility]   Overall Financial Resource Strain (CARDIA)    Difficulty of Paying Living Expenses: Not very hard  Recent Concern: Financial Resource Strain - Medium Risk (10/16/2023)   Received from [facility]   Overall Financial Resource Strain (CARDIA)    Difficulty of Paying Living Expenses: Somewhat hard  Food Insecurity: Food Insecurity Present (11/21/2023)   Received from [facility]   Hunger Vital Sign    Within the past 12 months,  you worried that your food would run out before you got the money to buy more.: Sometimes true    Within the past 12 months, the food you bought just didn't last and you didn't have money to get more.: Never true  Transportation Needs: No Transportation Needs (11/21/2023)   Received from [facility] - Transportation    In the past 12 months, has lack of transportation kept you from medical appointments or from getting medications?: No    Lack of Transportation (Non-Medical): No  Physical Activity: Insufficiently Active (11/18/2022)   Exercise Vital Sign    Days of Exercise per Week: 1 day    Minutes of Exercise per Session: 30 min  Stress: No Stress Concern Present (11/18/2022)   [employer] - Occupational Stress Questionnaire    Feeling of Stress : Not at all  Social Connections: Moderately Integrated (11/18/2022)   Social Connection and Isolation Panel    Frequency of Communication with Friends and Family: Once a week    Frequency of Social Gatherings with Friends and Family: Once a week    Attends Religious Services: More than 4 times per year    Active Member of [employer] or Organizations: Yes    Attends Engineer, Structural: More than 4 times per year    Marital Status: Married  Catering Manager Violence: Not At Risk (11/13/2022)   Humiliation, Afraid, Rape, and Kick questionnaire    Fear of Current or Ex-Partner: No    Emotionally Abused: No    Physically Abused: No    Sexually Abused: No    Family History  Problem Relation Age of Onset   Early death Mother    Hypertension Sister    Varicose Veins Sister    Asthma Daughter    Cancer Maternal Grandmother    Heart disease Maternal Grandfather    Diabetes Son    Breast cancer Neg Hx     Allergies  Allergen Reactions   Citrus Other (See Comments)    Rash in mouth  Rash in mouth    Review of Systems  All other systems reviewed and are negative.       Objective:   BP (!) 140/84   Pulse (!) 120   Ht 5' 1 (1.549 m)   Wt 166 lb 12.8 oz (75.7 kg)   SpO2 97%   BMI 31.52 kg/m   Vitals:   06/03/24 1016  BP: (!) 140/84  Pulse: (!) 120  Height: 5' 1 (1.549 m)  Weight: 166 lb 12.8 oz (75.7 kg)  SpO2: 97%  BMI (Calculated): 31.53    Physical Exam Vitals and nursing note reviewed.  Constitutional:      Appearance: Normal appearance. She is normal weight.  HENT:  Head: Normocephalic and atraumatic.     Right Ear: External ear normal.     Left Ear: External ear normal.     Nose: Nose normal.  Eyes:     Extraocular Movements: Extraocular movements intact.     Conjunctiva/sclera: Conjunctivae normal.     Pupils: Pupils are equal, round, and reactive to light.  Cardiovascular:     Rate and Rhythm: Normal rate and regular rhythm.     Pulses: Normal pulses.  Pulmonary:     Effort: Pulmonary effort is normal.  Musculoskeletal:     Cervical back: Normal range of motion.  Neurological:     General: No focal deficit present.     Mental Status: She is alert and oriented to person, place, and time. Mental status is at baseline.  Psychiatric:        Mood and Affect: Mood normal.        Behavior: Behavior normal.        Thought Content: Thought content normal.        Judgment: Judgment normal.      Results for orders placed or performed in visit on 06/03/24  POCT CBG (Fasting - Glucose)  Result Value Ref Range   Glucose Fasting, POC 114 (A) 70 - 99 mg/dL    Recent Results (from the past 2160 hours)  POCT CBG (Fasting - Glucose)     Status: Abnormal   Collection Time: 06/03/24 10:22 AM  Result Value Ref Range   Glucose Fasting, POC 114 (A) 70 - 99 mg/dL       Assessment & Plan Type 2 diabetes mellitus with diabetic neuropathy, without long-term current use of insulin (HCC) Checking labs today. Will call pt. With results  Continue current diabetes POC, as patient has been well controlled on current regimen.   Will adjust meds if needed based on labs.   -CBC w/Diff -CMP w/eGFR -Hemoglobin A1C  Myoneural disorder, unspecified (HCC) Patient is seen by neurology, who manage this condition.  She is well controlled with current therapy.   Will defer to them for further changes to plan of care.  Encounter for screening for osteoporosis DEXA scan ordered.   Will set up and will call with results when they are available.   Hypertension associated with diabetes (HCC) Blood pressure well controlled with current medications.  Continue current therapy.  Will reassess at follow up.   - CBC w/Diff - CMP w/eGFR  Hyperlipidemia associated with type 2 diabetes mellitus (HCC) Checking labs today.  Continue current therapy for lipid control. Will modify as needed based on labwork results.   -CMP w/eGFR -Lipid Panel  Vitamin D  deficiency, unspecified B12 deficiency due to diet Other fatigue Checking labs today.  Will continue supplements as needed.   - Vitamin D  - Vitamin B12 - TSH     Return in about 3 months (around 09/01/2024) for AWV.   Total time spent: 20 minutes  ALAN CHRISTELLA ARRANT, FNP  06/03/2024   This document may have been prepared by [facility] Voice Recognition software and as such may include unintentional dictation errors.

## 2024-06-04 LAB — CMP14+EGFR
ALT: 13 IU/L (ref 0–32)
AST: 18 IU/L (ref 0–40)
Albumin: 3.9 g/dL (ref 3.7–4.7)
Alkaline Phosphatase: 80 IU/L (ref 48–129)
BUN/Creatinine Ratio: 28 (ref 12–28)
BUN: 19 mg/dL (ref 8–27)
Bilirubin Total: 0.4 mg/dL (ref 0.0–1.2)
CO2: 26 mmol/L (ref 20–29)
Calcium: 10.1 mg/dL (ref 8.7–10.3)
Chloride: 103 mmol/L (ref 96–106)
Creatinine, Ser: 0.67 mg/dL (ref 0.57–1.00)
Globulin, Total: 2.5 g/dL (ref 1.5–4.5)
Glucose: 105 mg/dL — ABNORMAL HIGH (ref 70–99)
Potassium: 4.5 mmol/L (ref 3.5–5.2)
Sodium: 140 mmol/L (ref 134–144)
Total Protein: 6.4 g/dL (ref 6.0–8.5)
eGFR: 88 mL/min/1.73 (ref >59)

## 2024-06-04 LAB — CBC WITH DIFFERENTIAL/PLATELET
Basophils Absolute: 0.1 x10E3/uL (ref 0.0–0.2)
Basos: 1 %
EOS (ABSOLUTE): 0.5 x10E3/uL — ABNORMAL HIGH (ref 0.0–0.4)
Eos: 7 %
Hematocrit: 41.9 % (ref 34.0–46.6)
Hemoglobin: 13.5 g/dL (ref 11.1–15.9)
Immature Grans (Abs): 0 x10E3/uL (ref 0.0–0.1)
Immature Granulocytes: 0 %
Lymphocytes Absolute: 1.5 x10E3/uL (ref 0.7–3.1)
Lymphs: 23 %
MCH: 31.9 pg (ref 26.6–33.0)
MCHC: 32.2 g/dL (ref 31.5–35.7)
MCV: 99 fL — ABNORMAL HIGH (ref 79–97)
Monocytes Absolute: 0.6 x10E3/uL (ref 0.1–0.9)
Monocytes: 9 %
Neutrophils Absolute: 3.9 x10E3/uL (ref 1.4–7.0)
Neutrophils: 60 %
Platelets: 287 x10E3/uL (ref 150–450)
RBC: 4.23 x10E6/uL (ref 3.77–5.28)
RDW: 12.5 % (ref 11.7–15.4)
WBC: 6.6 x10E3/uL (ref 3.4–10.8)

## 2024-06-04 LAB — HEMOGLOBIN A1C
Est. average glucose Bld gHb Est-mCnc: 148 mg/dL
Hgb A1c MFr Bld: 6.8 % — ABNORMAL HIGH (ref 4.8–5.6)

## 2024-06-04 LAB — VITAMIN D 25 HYDROXY (VIT D DEFICIENCY, FRACTURES): Vit D, 25-Hydroxy: 70.2 ng/mL (ref 30.0–100.0)

## 2024-06-04 LAB — VITAMIN B12: Vitamin B-12: 607 pg/mL (ref 232–1245)

## 2024-06-04 LAB — LIPID PANEL
Chol/HDL Ratio: 2.2 ratio (ref 0.0–4.4)
Cholesterol, Total: 164 mg/dL (ref 100–199)
HDL: 76 mg/dL (ref >39)
LDL Chol Calc (NIH): 61 mg/dL (ref 0–99)
Triglycerides: 161 mg/dL — ABNORMAL HIGH (ref 0–149)
VLDL Cholesterol Cal: 27 mg/dL (ref 5–40)

## 2024-06-11 DIAGNOSIS — E559 Vitamin D deficiency, unspecified: Secondary | ICD-10-CM | POA: Insufficient documentation

## 2024-06-11 DIAGNOSIS — E538 Deficiency of other specified B group vitamins: Secondary | ICD-10-CM | POA: Insufficient documentation

## 2024-06-11 NOTE — Assessment & Plan Note (Signed)
 Patient is seen by neurology, who manage this condition.  She is well controlled with current therapy.   Will defer to them for further changes to plan of care.

## 2024-06-11 NOTE — Assessment & Plan Note (Signed)
 Checking labs today.  Will continue supplements as needed.   - Vitamin D  - Vitamin B12 - TSH

## 2024-06-11 NOTE — Assessment & Plan Note (Signed)
 Checking labs today. Will call pt. With results  Continue current diabetes POC, as patient has been well controlled on current regimen.  Will adjust meds if needed based on labs.   -CBC w/Diff -CMP w/eGFR -Hemoglobin A1C

## 2024-06-11 NOTE — Assessment & Plan Note (Signed)
 Blood pressure well controlled with current medications.  Continue current therapy.  Will reassess at follow up.   - CBC w/Diff - CMP w/eGFR

## 2024-06-11 NOTE — Assessment & Plan Note (Signed)
 Checking labs today.  Continue current therapy for lipid control. Will modify as needed based on labwork results.   -CMP w/eGFR -Lipid Panel

## 2024-09-02 ENCOUNTER — Ambulatory Visit: Admitting: Family
